# Patient Record
Sex: Female | Born: 1990 | Race: White | Hispanic: No | Marital: Married | Smoking: Current every day smoker
Health system: Southern US, Community
[De-identification: ages and names within clinical notes are randomized; demographics above are authoritative.]

## PROBLEM LIST (undated history)

## (undated) ENCOUNTER — Inpatient Hospital Stay (HOSPITAL_COMMUNITY): Admission: RE | Payer: Medicaid Other | Source: Ambulatory Visit

## (undated) ENCOUNTER — Inpatient Hospital Stay (HOSPITAL_COMMUNITY): Payer: Self-pay

## (undated) DIAGNOSIS — R519 Headache, unspecified: Secondary | ICD-10-CM

## (undated) DIAGNOSIS — M797 Fibromyalgia: Secondary | ICD-10-CM

## (undated) DIAGNOSIS — M549 Dorsalgia, unspecified: Secondary | ICD-10-CM

## (undated) DIAGNOSIS — Z789 Other specified health status: Secondary | ICD-10-CM

## (undated) DIAGNOSIS — Z8619 Personal history of other infectious and parasitic diseases: Secondary | ICD-10-CM

## (undated) DIAGNOSIS — F32A Depression, unspecified: Secondary | ICD-10-CM

## (undated) DIAGNOSIS — N920 Excessive and frequent menstruation with regular cycle: Secondary | ICD-10-CM

## (undated) DIAGNOSIS — G8929 Other chronic pain: Secondary | ICD-10-CM

## (undated) DIAGNOSIS — F419 Anxiety disorder, unspecified: Secondary | ICD-10-CM

## (undated) DIAGNOSIS — J45909 Unspecified asthma, uncomplicated: Secondary | ICD-10-CM

## (undated) DIAGNOSIS — R51 Headache: Secondary | ICD-10-CM

## (undated) DIAGNOSIS — N390 Urinary tract infection, site not specified: Secondary | ICD-10-CM

## (undated) DIAGNOSIS — F329 Major depressive disorder, single episode, unspecified: Secondary | ICD-10-CM

## (undated) HISTORY — PX: TONSILLECTOMY: SUR1361

## (undated) HISTORY — PX: CHOLECYSTECTOMY: SHX55

## (undated) HISTORY — DX: Urinary tract infection, site not specified: N39.0

## (undated) HISTORY — DX: Unspecified asthma, uncomplicated: J45.909

## (undated) HISTORY — DX: Excessive and frequent menstruation with regular cycle: N92.0

## (undated) HISTORY — PX: DILATION AND CURETTAGE OF UTERUS: SHX78

## (undated) HISTORY — DX: Personal history of other infectious and parasitic diseases: Z86.19

---
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
## 2012-01-20 ENCOUNTER — Telehealth: Payer: Self-pay | Admitting: Obstetrics and Gynecology

## 2012-01-21 ENCOUNTER — Encounter: Payer: Self-pay | Admitting: Obstetrics and Gynecology

## 2012-01-21 ENCOUNTER — Ambulatory Visit (INDEPENDENT_AMBULATORY_CARE_PROVIDER_SITE_OTHER): Payer: Medicaid Other | Admitting: Obstetrics and Gynecology

## 2012-01-21 VITALS — BP 118/70 | Resp 14 | Ht 66.0 in | Wt 152.0 lb

## 2012-01-21 DIAGNOSIS — N949 Unspecified condition associated with female genital organs and menstrual cycle: Secondary | ICD-10-CM

## 2012-01-21 DIAGNOSIS — N915 Oligomenorrhea, unspecified: Secondary | ICD-10-CM

## 2012-01-21 DIAGNOSIS — R102 Pelvic and perineal pain: Secondary | ICD-10-CM

## 2012-01-21 DIAGNOSIS — R300 Dysuria: Secondary | ICD-10-CM

## 2012-01-21 LAB — POCT URINALYSIS DIPSTICK
Glucose, UA: NEGATIVE
Spec Grav, UA: <=1.005
Urobilinogen, UA: NEGATIVE

## 2012-01-21 LAB — POCT WET PREP (WET MOUNT): pH: 4.5

## 2012-01-21 MED ORDER — DROSPIRENONE-ETHINYL ESTRADIOL 3-0.02 MG PO TABS: 1.0000 | ORAL_TABLET | Freq: Every day | ORAL | Status: DC

## 2012-01-21 NOTE — Progress Notes (Signed)
C/o swelling and discomfort at vagina.  Denies itching or irritation.  Also c/o burning with urination.  Filed Vitals:   01/21/12 1036  BP: 118/70  Resp: 14   ROS: noncontributory  Pelvic exam:  VULVA: normal appearing vulva with no masses, tenderness or lesions, VAGINA: normal appearing vagina with normal color and discharge, no lesions, CERVIX: normal appearing cervix without discharge or lesions,  UTERUS: uterus is normal size, shape, consistency and nontender,  ADNEXA: normal adnexa in size, nontender and no masses. No abnormal d/c  Results for orders placed in visit on 01/21/12  POCT URINALYSIS DIPSTICK      Component Value Range   Color, UA yellow     Clarity, UA clear     Glucose, UA neg     Bilirubin, UA neg     Ketones, UA neg     Spec Grav, UA <=1.005     Blood, UA trace     pH, UA 5.0     Protein, UA neg     Urobilinogen, UA negative     Nitrite, UA neg     Leukocytes, UA Negative    POCT WET PREP (WET MOUNT)      Component Value Range   Source Wet Prep POC vaginal     WBC, Wet Prep HPF POC       Bacteria Wet Prep HPF POC none     BACTERIA WET PREP MORPHOLOGY POC       Clue Cells Wet Prep HPF POC None     CLUE CELLS WET PREP WHIFF POC Negative Whiff     Yeast Wet Prep HPF POC None     KOH Wet Prep POC       Trichomonas Wet Prep HPF POC none     pH 4.5    POCT URINE PREGNANCY      Component Value Range   Preg Test, Ur Negative     A/P Amenorrhea on Junel (pt would like cycle) Change to Yaz (pt request) - she liked it in the past RTO for f/u OCPs RTO sooner if sxs persists

## 2012-01-23 ENCOUNTER — Telehealth: Payer: Self-pay

## 2012-01-23 ENCOUNTER — Telehealth: Payer: Self-pay | Admitting: Obstetrics and Gynecology

## 2012-01-23 LAB — URINE CULTURE

## 2012-01-23 NOTE — Telephone Encounter (Signed)
Routed to jackie  

## 2012-01-23 NOTE — Telephone Encounter (Signed)
Pt mother called requesting UPT results.Pt mother Marissa Contreras is listed on hippa form and was given results--NEG.PG [name]

## 2012-01-23 NOTE — Telephone Encounter (Signed)
Returned call to pt mother re: upt results,lmovm to return call. PG [name]

## 2012-02-03 ENCOUNTER — Telehealth: Payer: Self-pay | Admitting: Obstetrics and Gynecology

## 2012-02-03 NOTE — Telephone Encounter (Signed)
Spoke with pt's mother Cam Hai, she called earlier regarding a referral for a breast reduction to be sent over to [facility] ATTN: Dr. Jonna Munro and to be faxed to # [phone]. She was advised that the message would be given to Dr. Su Hilt and we will contact her with further instructions. Mathis Bud

## 2012-02-03 NOTE — Telephone Encounter (Signed)
AR PT 

## 2012-02-03 NOTE — Telephone Encounter (Signed)
Dr. Su Hilt this pt wants a referral for breast reduction. Last ov was 01/21/2012. Mathis Bud

## 2012-02-04 ENCOUNTER — Telehealth: Payer: Self-pay

## 2012-02-05 ENCOUNTER — Emergency Department (HOSPITAL_COMMUNITY)
Admission: EM | Admit: 2012-02-05 | Discharge: 2012-02-06 | Disposition: A | Discharge status: Home / Self Care | Payer: Medicaid Other | Attending: Emergency Medicine | Admitting: Emergency Medicine

## 2012-02-05 ENCOUNTER — Encounter (HOSPITAL_COMMUNITY): Payer: Self-pay | Admitting: Emergency Medicine

## 2012-02-05 DIAGNOSIS — R300 Dysuria: Secondary | ICD-10-CM | POA: Insufficient documentation

## 2012-02-05 DIAGNOSIS — M5432 Sciatica, left side: Secondary | ICD-10-CM

## 2012-02-05 DIAGNOSIS — M5430 Sciatica, unspecified side: Secondary | ICD-10-CM | POA: Insufficient documentation

## 2012-02-05 DIAGNOSIS — M5380 Other specified dorsopathies, site unspecified: Secondary | ICD-10-CM | POA: Insufficient documentation

## 2012-02-05 LAB — URINALYSIS, ROUTINE W REFLEX MICROSCOPIC
Glucose, UA: NEGATIVE mg/dL
Hgb urine dipstick: NEGATIVE
Ketones, ur: NEGATIVE mg/dL
Leukocytes, UA: NEGATIVE
Nitrite: NEGATIVE

## 2012-02-05 NOTE — ED Notes (Signed)
PT. REPORTS DYSURIA WITH LOW BACK PAIN RADIATING TO LEFT LEG ONSET THIS EVENING , DENIES INJURY , NO HEMATURIA .

## 2012-02-06 ENCOUNTER — Emergency Department (HOSPITAL_COMMUNITY): Payer: Medicaid Other

## 2012-02-06 MED ORDER — DIAZEPAM 5 MG PO TABS: 5.0000 mg | ORAL_TABLET | Freq: Two times a day (BID) | ORAL | Status: DC

## 2012-02-06 MED ORDER — OXYCODONE-ACETAMINOPHEN 5-325 MG PO TABS: 2.0000 | ORAL_TABLET | ORAL | Status: DC | PRN

## 2012-02-06 MED ORDER — DEXAMETHASONE SODIUM PHOSPHATE 10 MG/ML IJ SOLN
10.0000 mg | Freq: Once | INTRAMUSCULAR | Status: AC
  Administered 2012-02-06: 10 mg via INTRAMUSCULAR
  Filled 2012-02-06: qty 1

## 2012-02-06 MED ORDER — DIAZEPAM 5 MG/ML IJ SOLN
5.0000 mg | Freq: Once | INTRAMUSCULAR | Status: AC
  Administered 2012-02-06: 5 mg via INTRAMUSCULAR
  Filled 2012-02-06: qty 2

## 2012-02-06 MED ORDER — HYDROMORPHONE HCL PF 1 MG/ML IJ SOLN
1.0000 mg | Freq: Once | INTRAMUSCULAR | Status: AC
  Administered 2012-02-06: 1 mg via INTRAMUSCULAR
  Filled 2012-02-06: qty 1

## 2012-02-06 MED ORDER — PREDNISONE 10 MG PO TABS: 20.0000 mg | ORAL_TABLET | Freq: Every day | ORAL | Status: DC

## 2012-02-06 NOTE — ED Provider Notes (Signed)
Medical screening examination/treatment/procedure(s) were performed by non-physician practitioner and as supervising physician I was immediately available for consultation/collaboration.  Gem Conkle M Chavie Kolinski, MD 02/06/12 0649 

## 2012-02-06 NOTE — ED Notes (Signed)
PA at bedside.

## 2012-02-06 NOTE — ED Notes (Signed)
Pt alert and oriented, with steady gait at time of discharge. Pt given discharge papers and papers explained. All questions answered and pt walked to discharge.  

## 2012-02-06 NOTE — ED Provider Notes (Signed)
History     CSN: [id number]  Arrival date & time 02/05/12  2329   First MD Initiated Contact with Patient 02/06/12 0004      Chief Complaint  Patient presents with  . Dysuria    (Consider location/radiation/quality/duration/timing/severity/associated sxs/prior treatment) HPI Comments: Patient here with left lower back pain with radiation into her left leg since this morning - she reports mild pain with urination - denies fever, chills, or hematuria - states no known injury to lower back - states tingling and numbness to left foot - pain worse with movement.  Patient is a 21 y.o. female presenting with dysuria. The history is provided by the patient. No language interpreter was used.  Dysuria  This is a new problem. The current episode started more than 1 week ago. The problem occurs every urination. The problem has not changed since onset.The quality of the pain is described as burning. The pain is at a severity of 5/10. The pain is moderate. There has been no fever. Pertinent negatives include no chills, no sweats, no nausea, no vomiting, no discharge, no frequency, no hematuria, no hesitancy, no possible pregnancy, no urgency and no flank pain. She has tried nothing for the symptoms.    History reviewed. No pertinent past medical history.  History reviewed. No pertinent past surgical history.  No family history on file.  History  Substance Use Topics  . Smoking status: Never Smoker   . Smokeless tobacco: Not on file  . Alcohol Use: No    OB History    Grav Para Term Preterm Abortions TAB SAB Ect Mult Living                  Review of Systems  Constitutional: Negative for chills.  Gastrointestinal: Negative for nausea and vomiting.  Genitourinary: Positive for dysuria. Negative for hesitancy, urgency, frequency, hematuria and flank pain.  Musculoskeletal: Positive for back pain and gait problem.  All other systems reviewed and are negative.    Allergies  Review of  patient's allergies indicates no known allergies.  Home Medications   Current Outpatient Rx  Name Route Sig Dispense Refill  . CHROMIUM PO Oral Take 1 tablet by mouth daily.    . DROSPIRENONE-ETHINYL ESTRADIOL 3-0.02 MG PO TABS Oral Take 1 tablet by mouth daily. 1 Package 3  . IRON PO Oral Take 1 tablet by mouth daily.    Marland Kitchen MAGNESIUM PO Oral Take 1 tablet by mouth daily.      BP 129/91  Pulse 124  Temp 98.1 F (36.7 C)  Resp 18  SpO2 100%  Physical Exam  Nursing note and vitals reviewed. Constitutional: She is oriented to person, place, and time. She appears well-developed and well-nourished. She appears distressed.       Uncomfortable appearing.  HENT:  Head: Normocephalic and atraumatic.  Right Ear: External ear normal.  Left Ear: External ear normal.  Nose: Nose normal.  Mouth/Throat: Oropharynx is clear and moist. No oropharyngeal exudate.  Eyes: Conjunctivae are normal. Pupils are equal, round, and reactive to light. No scleral icterus.  Neck: Normal range of motion. Neck supple.  Cardiovascular: Normal rate, regular rhythm and normal heart sounds.  Exam reveals no gallop and no friction rub.   No murmur heard. Pulmonary/Chest: Effort normal and breath sounds normal. No respiratory distress. She has no wheezes. She has no rales. She exhibits no tenderness.  Abdominal: Soft. Bowel sounds are normal. She exhibits no distension and no mass. There is no tenderness. There  is no rebound, no guarding and no CVA tenderness.  Musculoskeletal:       Lumbar back: She exhibits decreased range of motion, tenderness and spasm. She exhibits no edema and normal pulse.       Back:       Pain on straight leg raise.  Lymphadenopathy:    She has no cervical adenopathy.  Neurological: She is alert and oriented to person, place, and time. No cranial nerve deficit.  Skin: Skin is warm and dry. No rash noted. No erythema. No pallor.  Psychiatric: She has a normal mood and affect. Her  behavior is normal. Judgment and thought content normal.    ED Course  Procedures (including critical care time)   Labs Reviewed  URINALYSIS, ROUTINE W REFLEX MICROSCOPIC  POCT PREGNANCY, URINE   Dg Lumbar Spine Complete  02/06/2012  *RADIOLOGY REPORT*  Clinical Data: Low back pain radiating to the left leg for 2 weeks. No known injury.  LUMBAR SPINE - COMPLETE 4+ VIEW  Comparison: None.  Findings: Five lumbar type vertebra.  Normal alignment of the lumbar vertebrae and facet joints.  No vertebral compression deformities.  Intervertebral disc space heights are preserved.  No focal bone lesion or bone destruction.  Bone cortex and trabecular architecture appear intact.  IMPRESSION: No displaced fractures identified.  Original Report Authenticated By: Marlon Pel, M.D.   Results for orders placed during the hospital encounter of 02/05/12  URINALYSIS, ROUTINE W REFLEX MICROSCOPIC      Component Value Range   Color, Urine YELLOW  YELLOW    APPearance CLEAR  CLEAR    Specific Gravity, Urine 1.005  1.005 - 1.030    pH 6.5  5.0 - 8.0    Glucose, UA NEGATIVE  NEGATIVE (mg/dL)   Hgb urine dipstick NEGATIVE  NEGATIVE    Bilirubin Urine NEGATIVE  NEGATIVE    Ketones, ur NEGATIVE  NEGATIVE (mg/dL)   Protein, ur NEGATIVE  NEGATIVE (mg/dL)   Urobilinogen, UA 0.2  0.0 - 1.0 (mg/dL)   Nitrite NEGATIVE  NEGATIVE    Leukocytes, UA NEGATIVE  NEGATIVE   POCT PREGNANCY, URINE      Component Value Range   Preg Test, Ur NEGATIVE  NEGATIVE    Dg Lumbar Spine Complete  02/06/2012  *RADIOLOGY REPORT*  Clinical Data: Low back pain radiating to the left leg for 2 weeks. No known injury.  LUMBAR SPINE - COMPLETE 4+ VIEW  Comparison: None.  Findings: Five lumbar type vertebra.  Normal alignment of the lumbar vertebrae and facet joints.  No vertebral compression deformities.  Intervertebral disc space heights are preserved.  No focal bone lesion or bone destruction.  Bone cortex and trabecular architecture  appear intact.  IMPRESSION: No displaced fractures identified.  Original Report Authenticated By: Marlon Pel, M.D.      Sciatica Left    MDM  Patinet here with negative urine with lower back pain with radiation down the left leg c/w sciatica - no evidence of disc disease on x-ray - pateint reports improvement in symptoms after medication - will follow up with PCP this coming week - tingling on left foot transient - no alarming signs for cauda equina.        Izola Price [place], [place] 02/06/12 [phone]

## 2012-02-08 NOTE — Telephone Encounter (Signed)
Please make referral.  Thank you.  FYI - don't close telephone encounter until all aspects have been completed.  Thank you

## 2012-02-10 ENCOUNTER — Telehealth: Payer: Self-pay | Admitting: *Deleted

## 2012-02-10 NOTE — Telephone Encounter (Signed)
Received call from [facility] stating they are listed on medicaid card as PCP .They will give our office their NPI for  visit tomorrow through the month of June while patient has chance to get it changed on his medicaid card.

## 2012-02-11 ENCOUNTER — Encounter: Payer: Self-pay | Admitting: Family Medicine

## 2012-02-11 ENCOUNTER — Ambulatory Visit (INDEPENDENT_AMBULATORY_CARE_PROVIDER_SITE_OTHER): Payer: Medicaid Other | Admitting: Family Medicine

## 2012-02-11 VITALS — BP 113/77 | HR 60 | Temp 97.0°F | Wt 149.7 lb

## 2012-02-11 DIAGNOSIS — M549 Dorsalgia, unspecified: Secondary | ICD-10-CM

## 2012-02-11 MED ORDER — HYDROCODONE-ACETAMINOPHEN 5-500 MG PO TABS: 1.0000 | ORAL_TABLET | Freq: Three times a day (TID) | ORAL | Status: DC | PRN

## 2012-02-11 MED ORDER — CYCLOBENZAPRINE HCL 10 MG PO TABS: 10.0000 mg | ORAL_TABLET | Freq: Three times a day (TID) | ORAL | Status: AC | PRN

## 2012-02-11 NOTE — Assessment & Plan Note (Addendum)
Acute on chronic low back pain with reports of numbness, tingling, weakness.  No objective deficits seen on exam today.  Will order MRI low spine to evaluate for stenosis, impingement.  Discussed with mom the possibility of false positives and in the absence of significant findings would discuss other etiologies such as fibromyalgia, mood disorder.   Will follow-up in office after MRI complete.  Gave rx for vicodin and flexeril, discussed will not be chronic medications.

## 2012-02-11 NOTE — Progress Notes (Signed)
  Subjective:    Patient ID: Marissa Contreras, female    DOB: [birth year], 21 y.o.   MRN: [id number]  HPI New patient: ER follow-up of back pain  History of chronic back and shoulder pain due to large breasts.  Worsening low back pain for several months, thought was due to lo-estrin Fe- was switched by OBGYN to another OCP which did not help.  1 week ago started to have acute worsening of low back pain.  Numbness and burning pain and tingling from low back down to toes front and back of legs.  Feels she is having more trouble moving her legs.  No problems with incotninence, No trouble with weakness, incoordiantion.  I have reviewed patient's  PMH, FH, and Social history and Medications as related to this visit. Fam Hx sig for mom, dad, brother with DJD and spinal stenosis, multiple surgeries between them all.    Mom has fibromyalgia.  She is concerned about ending an MRI.  Asks if this could be fibromyalgia.   Review of Systems Patient Information Form: Screening and ROS  AUDIT-C Score: 0 Do you feel safe in relationships? yes PHQ-2:positive  Review of Symptoms  General:  Negative for nexplained weight loss, fever Skin: Negative for new or changing mole, sore that won't heal HEENT: Negative for trouble hearing, trouble seeing, ringing in ears, mouth sores, hoarseness, change in voice, dysphagia. CV:  Negative for chest pain, dyspnea, edema, palpitations Resp: Negative for cough, dyspnea, hemoptysis GI: Negative for nausea, vomiting, diarrhea, constipation, abdominal pain, melena, hematochezia. GU: Negative for dysuria, incontinence, urinary hesitance, hematuria, vaginal or penile discharge, polyuria, sexual difficulty, lumps in testicle or breasts MSK: Negative for muscle cramps or aches, joint pain or swelling Neuro: Negative for headaches, weakness, numbness, dizziness, passing out/fainting Psych: Negative for depression, anxiety, memory problems  Negative as above except for  positives noted below:  Positive for chronic pain with urination, dyspnea, fast heartbeat, nausea, constipation, abdominal pain, headaches, weakness, numbness, dizziness    Objective:   Physical Exam GEN: Alert & Oriented, Here with her mom who does a lot of the speaking for her.  Tearful at times.  CV:  Regular Rate & Rhythm, no murmur Respiratory:  Normal work of breathing, CTAB Abd:  + BS, soft, no tenderness to palpation Ext: no pre-tibial edema MSK:  ttp paraspinal muscles lumbar spine.  Straight leg raise elicits pain in knees, hips, legs, low back bilaterally.   Neuro:  Strength 5/5.  Gait normal.  CN 2-12 grossly intact.  Patellar and biceps reflexes 2+ bilaterally.  Soft tissue tenderness to light touch throughout arms, legs     Assessment & Plan:

## 2012-02-11 NOTE — Patient Instructions (Signed)
Will order MRI to check out your low back  Make follow-up appt for after MRI to discuss results and talk about other things that may be contributing to pain

## 2012-02-12 NOTE — Telephone Encounter (Signed)
Mel, please make referral to Dr. Jonna Munro, per AR. Thanks.Marland KitchenMarland KitchenAlvino Chapel

## 2012-02-13 ENCOUNTER — Telehealth: Payer: Self-pay | Admitting: Family Medicine

## 2012-02-13 ENCOUNTER — Ambulatory Visit
Admission: RE | Admit: 2012-02-13 | Discharge: 2012-02-13 | Disposition: A | Discharge status: Home / Self Care | Payer: Medicaid Other | Source: Ambulatory Visit | Attending: Family Medicine | Admitting: Family Medicine

## 2012-02-13 DIAGNOSIS — M549 Dorsalgia, unspecified: Secondary | ICD-10-CM

## 2012-02-13 NOTE — Telephone Encounter (Signed)
Is waiting for results of MRI - would like to get this before weekend.

## 2012-02-13 NOTE — Telephone Encounter (Signed)
Please let her know results are normal.

## 2012-02-13 NOTE — Telephone Encounter (Signed)
Called patient's mother to give results of MRI, she says her daughter is still having pain and wants to see Dr Earnest Bailey for follow up. I have scheduled her an appointment for 02/18/12 at 3:15.Marissa Contreras, Rodena Medin

## 2012-02-14 ENCOUNTER — Other Ambulatory Visit: Payer: Medicaid Other

## 2012-02-17 ENCOUNTER — Ambulatory Visit (INDEPENDENT_AMBULATORY_CARE_PROVIDER_SITE_OTHER): Payer: Medicaid Other | Admitting: Family Medicine

## 2012-02-17 ENCOUNTER — Encounter: Payer: Self-pay | Admitting: Family Medicine

## 2012-02-17 VITALS — BP 110/64 | HR 104 | Temp 98.8°F | Ht 66.0 in | Wt 149.0 lb

## 2012-02-17 DIAGNOSIS — G8929 Other chronic pain: Secondary | ICD-10-CM

## 2012-02-17 MED ORDER — NAPROXEN 500 MG PO TABS: 500.0000 mg | ORAL_TABLET | Freq: Two times a day (BID) | ORAL | Status: DC

## 2012-02-17 NOTE — Patient Instructions (Signed)
I would recommend a women's one a day multivitamin  Will refer you to sports medicine to evaluate pain  Will get records from your previous doctor  Naproxen for pain occassionally as needed, or before periods  Follow-up yearly or as needed.

## 2012-02-18 ENCOUNTER — Ambulatory Visit: Payer: Medicaid Other | Admitting: Family Medicine

## 2012-02-18 ENCOUNTER — Encounter: Payer: Self-pay | Admitting: Family Medicine

## 2012-02-18 DIAGNOSIS — G8929 Other chronic pain: Secondary | ICD-10-CM | POA: Insufficient documentation

## 2012-02-18 NOTE — Progress Notes (Signed)
  Subjective:    Patient ID: SURYA FOLDEN, female    DOB: 1991-08-20, 21 y.o.   MRN: [id number]  HPI  Here to follow-up MRI results and further discuss pain  MRI lumbar spine normal.    Low and mid Back pain:  Since 5th grade, states onset was at breast development.  Has an appointment with a surgeon to discuss breast reduction.  Bilateral Knee pain:  Bilateral knees pain x 5 years.  States was told in past she has osgood-schlatter,  Then more recently may be due to "abnormal movement of kneecap"  Bilateral hip pain:  6 years in duration, states was told she has abnormal laxity causing pain.  Does not currently do any exercise.  Did not respond to course of steroids from ER, somewhat helped by vicodin, taking flexeril TID.    Screening for depression, anxiety, sleep abnormalities neg. Review of Systems    numbnes sin legs and toes. Objective:   Physical Exam GEN: Alert & Oriented, No acute distress MSK:  Pain out of proportion to exam, ttp over soft tissues all over body.        Assessment & Plan:

## 2012-02-18 NOTE — Assessment & Plan Note (Addendum)
Chronic pain of back, hips, knees- most concerning for etiology such as fibromyalgia.  No co-morbid mood disorder identified today.  For back pain, patient is seeking breast reduction.  Discussed with patient that key to controlling pain will be based on increased physical activity and possible physical therapy and will defer starting medication if are able to control symptoms with lifestyle modification.  Rxed naproxen for prn use.  Will refer to sports medicine to evaluate hip and knee pain to rule out MSk disorder and recommend specific therapies that may be helpful in increasing her function

## 2012-02-27 ENCOUNTER — Encounter: Payer: Self-pay | Admitting: Family Medicine

## 2012-02-27 ENCOUNTER — Ambulatory Visit (INDEPENDENT_AMBULATORY_CARE_PROVIDER_SITE_OTHER): Payer: Medicaid Other | Admitting: Family Medicine

## 2012-02-27 VITALS — BP 123/80 | HR 99 | Ht 66.0 in | Wt 149.0 lb

## 2012-02-27 DIAGNOSIS — M549 Dorsalgia, unspecified: Secondary | ICD-10-CM

## 2012-02-27 DIAGNOSIS — IMO0002 Reserved for concepts with insufficient information to code with codable children: Secondary | ICD-10-CM

## 2012-02-27 DIAGNOSIS — M419 Scoliosis, unspecified: Secondary | ICD-10-CM

## 2012-02-27 DIAGNOSIS — M5416 Radiculopathy, lumbar region: Secondary | ICD-10-CM

## 2012-02-27 DIAGNOSIS — M4120 Other idiopathic scoliosis, site unspecified: Secondary | ICD-10-CM

## 2012-02-27 MED ORDER — METHOCARBAMOL 500 MG PO TABS: 500.0000 mg | ORAL_TABLET | Freq: Three times a day (TID) | ORAL | Status: AC

## 2012-02-27 NOTE — Progress Notes (Signed)
  Subjective:    Patient ID: Marissa Contreras, female    DOB: 21-Jul-1991, 21 y.o.   MRN: [id number]  HPI  Marissa Contreras is a pleasant 21 years old and the patient come in today complaining of low back pain for about a year. She denies any injury to her back. She states that the pain started gradually, and he has worsening in the last 2 months. She localized the pain on the center of her back around T12 all the way down to L5-S1, she states that she has radiation of the pain on her left lower extremity all the way down to her left foot. No numbness no tingling. The pain is sharp, 4/10 in intensity, on and off, worse with certain positions, improve with medications as narcotics and muscle relaxant. She was seen on the ED recently for the same reason. She had x-rays of her lumbar spine which show mild scoliosis, she also had an MRI of her lumbar spine recently on May which was normal. She never has done any kind of treatment for the low back Patient Active Problem List  Diagnoses  . Back pain  . Chronic pain   Current Outpatient Prescriptions on File Prior to Visit  Medication Sig Dispense Refill  . drospirenone-ethinyl estradiol (YAZ) 3-0.02 MG tablet Take 1 tablet by mouth daily.  1 Package  3  . naproxen (NAPROSYN) 500 MG tablet Take 1 tablet (500 mg total) by mouth 2 (two) times daily with a meal.  60 tablet  0   No Known Allergies   Review of Systems  Constitutional: Negative for fever, chills, diaphoresis and fatigue.  Musculoskeletal: Positive for back pain. Negative for joint swelling, arthralgias and gait problem.  Neurological: Negative for weakness and numbness.       Objective:   Physical Exam  Constitutional: She is oriented to person, place, and time. She appears well-developed and well-nourished.       BP 123/80  Pulse 99  Ht 5\' 6"  (1.676 m)  Wt 149 lb (67.586 kg)  BMI 24.05 kg/m2   Pulmonary/Chest: Effort normal.  Musculoskeletal:       Low back with intact skin. No  swelling, no hematomas. FROM for flexion, extension, rotation and lateralization. Pain at the extreme of flexion and extension  No Tenderness to palpation on SI joint area B/L. Faber test negative for SI joint pain. Straight leg raise negative B/L. Strength 5/5 for hip flexion and extension, 5/5 for knee flexion and extension, 5/5 for ankle plantar and dorsal flexion B/L. DTR patellar and achilles II/IV B/L Sensation intact distally B/L. No leg discrepancy.   Neurological: She is alert and oriented to person, place, and time.  Skin: Skin is warm. No rash noted. No erythema.  Psychiatric: She has a normal mood and affect. Her behavior is normal. Thought content normal.          Assessment & Plan:   1. Back pain  methocarbamol (ROBAXIN) 500 MG tablet, Ambulatory referral to Physical Therapy  2. Scoliosis  methocarbamol (ROBAXIN) 500 MG tablet, Ambulatory referral to Physical Therapy  3. Left lumbar radiculopathy      Recommended to do physical therapy for her low back pain Discuss with her the benefits of weight loss Discussed with her the side effects of long use of narcotics Follow up after completing physical therapy

## 2012-03-03 ENCOUNTER — Telehealth: Payer: Self-pay | Admitting: Obstetrics and Gynecology

## 2012-03-03 NOTE — Telephone Encounter (Signed)
TRIAGE/EPIC °

## 2012-03-03 NOTE — Telephone Encounter (Signed)
JACKIE/PAM/PT CALL BCK

## 2012-03-04 ENCOUNTER — Telehealth: Payer: Self-pay

## 2012-03-11 ENCOUNTER — Ambulatory Visit (INDEPENDENT_AMBULATORY_CARE_PROVIDER_SITE_OTHER): Payer: Medicaid Other | Admitting: Obstetrics and Gynecology

## 2012-03-11 ENCOUNTER — Encounter: Payer: Self-pay | Admitting: Obstetrics and Gynecology

## 2012-03-11 VITALS — BP 114/80 | HR 72 | Ht 66.0 in | Wt 149.0 lb

## 2012-03-11 DIAGNOSIS — Z309 Encounter for contraceptive management, unspecified: Secondary | ICD-10-CM

## 2012-03-11 DIAGNOSIS — Z8619 Personal history of other infectious and parasitic diseases: Secondary | ICD-10-CM

## 2012-03-11 DIAGNOSIS — IMO0001 Reserved for inherently not codable concepts without codable children: Secondary | ICD-10-CM

## 2012-03-11 LAB — POCT URINE PREGNANCY: Preg Test, Ur: NEGATIVE

## 2012-03-11 NOTE — Progress Notes (Signed)
20 YO S/P SAB x 2 previously on Lo Loestrin that caused breast pain and vaginal swelling/dryness and pain.  Now on generic Yaz (second pack) complaining of daily nausea and period is only spotting for a period.  In the past "another pill" caused severe headaches and Implanon causes cramps.   Abdomen: soft, non-tender  Pelvic: EGBUS-wnl, vagina-normal rugae, uterus normal size, adnexae-no masses  UPT-negative   A: Intolerance of combination pills and Progestin Implant     S/P SAB x 2      No desire to conceive due to previious SABs      P: Information given on Heartstrings            Reviewed Paragard and Skylar and brochure given on each      for consideration        RTO-once a decision has been made about alternative        contraception

## 2012-03-11 NOTE — Progress Notes (Signed)
PT HAD SAB IN DEC,WAS PUT ON BIRTH IN MARCH;SPOTTING WITH LOLOESTRIN AND WAS PUT ON YAZ AND NOW STILL SPOTTING WITH YAZ AND HAVING NAUSEA.HEADACHE AND BREAST TENDERNESS

## 2012-03-11 NOTE — Patient Instructions (Signed)
Give informaition on Skylar and Paragard IUD

## 2012-03-12 ENCOUNTER — Encounter: Payer: Self-pay | Admitting: *Deleted

## 2012-03-12 ENCOUNTER — Telehealth: Payer: Self-pay | Admitting: *Deleted

## 2012-03-12 DIAGNOSIS — M549 Dorsalgia, unspecified: Secondary | ICD-10-CM

## 2012-03-12 NOTE — Telephone Encounter (Signed)
Spoke with pt's mom- she states they have not heard from PT to schedule Stephaine's appt.  Resent referral and gave pt's mom the number to PT to call early next week to set up appt.  Also, pt's mom states that pt has neck pain and hand numbness, she is requesting a nerve conduction study.  Advised will ask Dr. Eppie Gibson.

## 2012-03-15 ENCOUNTER — Ambulatory Visit: Payer: Medicaid Other | Admitting: Family Medicine

## 2012-03-15 NOTE — Telephone Encounter (Signed)
Spoke with pt's mom- gave her message from Dr. Jennette Kettle about nerve conduction study.  Pt's mom agreeable.

## 2012-03-15 NOTE — Telephone Encounter (Signed)
Amy Let's take care of the back first--then we can see her for any hand numbness. I would likely NOT jump immediately to Trego studies for many reasone,,,including they are painful and you have to know WHICH nerve eactly you are looking at AND they are not always definitive. We would definitely need to SEE her specifically for that issue before we went down that path. THANKS! Denny Levy

## 2012-03-30 ENCOUNTER — Ambulatory Visit: Payer: Medicaid Other | Attending: Family Medicine | Admitting: Physical Therapy

## 2012-03-30 DIAGNOSIS — M545 Low back pain, unspecified: Secondary | ICD-10-CM | POA: Insufficient documentation

## 2012-03-30 DIAGNOSIS — M25559 Pain in unspecified hip: Secondary | ICD-10-CM | POA: Insufficient documentation

## 2012-03-30 DIAGNOSIS — IMO0001 Reserved for inherently not codable concepts without codable children: Secondary | ICD-10-CM | POA: Insufficient documentation

## 2012-03-30 DIAGNOSIS — M546 Pain in thoracic spine: Secondary | ICD-10-CM | POA: Insufficient documentation

## 2012-03-30 DIAGNOSIS — R293 Abnormal posture: Secondary | ICD-10-CM | POA: Insufficient documentation

## 2012-04-06 ENCOUNTER — Encounter: Payer: Self-pay | Admitting: Obstetrics and Gynecology

## 2012-04-06 ENCOUNTER — Ambulatory Visit: Payer: Medicaid Other | Admitting: Physical Therapy

## 2012-04-06 ENCOUNTER — Ambulatory Visit (INDEPENDENT_AMBULATORY_CARE_PROVIDER_SITE_OTHER): Payer: Medicaid Other | Admitting: Obstetrics and Gynecology

## 2012-04-06 VITALS — BP 100/70 | Temp 99.1°F | Wt 147.0 lb

## 2012-04-06 DIAGNOSIS — B373 Candidiasis of vulva and vagina: Secondary | ICD-10-CM

## 2012-04-06 DIAGNOSIS — R5383 Other fatigue: Secondary | ICD-10-CM

## 2012-04-06 DIAGNOSIS — N943 Premenstrual tension syndrome: Secondary | ICD-10-CM

## 2012-04-06 DIAGNOSIS — N898 Other specified noninflammatory disorders of vagina: Secondary | ICD-10-CM

## 2012-04-06 DIAGNOSIS — R5381 Other malaise: Secondary | ICD-10-CM

## 2012-04-06 DIAGNOSIS — R102 Pelvic and perineal pain: Secondary | ICD-10-CM

## 2012-04-06 DIAGNOSIS — N949 Unspecified condition associated with female genital organs and menstrual cycle: Secondary | ICD-10-CM

## 2012-04-06 DIAGNOSIS — N644 Mastodynia: Secondary | ICD-10-CM

## 2012-04-06 LAB — CBC
HCT: 39.3 % (ref 36.0–46.0)
Hemoglobin: 13.2 g/dL (ref 12.0–15.0)
MCHC: 33.6 g/dL (ref 30.0–36.0)
RDW: 12.9 % (ref 11.5–15.5)
WBC: 6.4 10*3/uL (ref 4.0–10.5)

## 2012-04-06 MED ORDER — FLUOXETINE HCL 20 MG PO CAPS: ORAL_CAPSULE | ORAL | Status: DC

## 2012-04-06 MED ORDER — FLUCONAZOLE 150 MG PO TABS: 150.0000 mg | ORAL_TABLET | Freq: Once | ORAL | Status: AC

## 2012-04-06 NOTE — Progress Notes (Signed)
20 YO complains of  vaginitis symptoms with irritation and swelling following intercourse last week.  Patient complains of breast pain but admits to consuming lots of caffiene.  Stopped BCPs in June 2013 and does not want to choose an alternative.  States she's not trying to conceive but it's okay if it happens.  Patient also complains of fatigue. Periods lasts 9 days with pad change twice a day and minimal cramps.  Lastly patient has PMS and would like something other than birth control pills for this. Reviewed Fem Premenstrual and Sarafem as alternative treatments along with daily calcium 500 mg/Vitamin D 400 IU bid.  O:  UPT-negative       U/A-negative             Pelvic: EGBUS-mild erythema with slight edema of labia minora bilaterally, vagina-hyperremic, scant white discharge, uterus-normal size, adnexae-no       masses or tenderness    A: Breast Pain due to caffeine excess      PMS      Yeast Vaginitis      Fatigue   P:  Patient wants to try Sarafem (fluoxetine) for PMS:  Fluoxetine 20 mg # 28 1 po day 14-28 of cycle 6 refills       Perineal hygiene; declines contraception, advised MVI with > 400 mcg of folic acid       CBC-pending       Diflucan 150 mg #1 1 po stat 1 refill       RTO-AEx or prn  Seema Blum, PA-C

## 2012-04-06 NOTE — Patient Instructions (Addendum)
Take a Multivitamin with at least 400 mcg of folic acid   Avoid: - excess soap on genital area (consider using plain oatmeal soap) - use of powder or sprays in genital area - douching - wearing underwear to bed (except with menses) - using more than is directed detergent when washing clothes - tight fitting garments around genital area - excess sugar intake

## 2012-04-06 NOTE — Progress Notes (Signed)
Color: WHITE Odor: no Itching:no Thin:yes Thick:yes Fever:no Dyspareunia:no Hx PID:no HX STD:no Pelvic Pain:yes X 2 DAYS AGO Desires Gc/CT:no Desires HIV,RPR,HbsAG:no

## 2012-04-08 ENCOUNTER — Ambulatory Visit: Payer: Medicaid Other | Admitting: Physical Therapy

## 2012-04-08 ENCOUNTER — Telehealth: Payer: Self-pay | Admitting: Obstetrics and Gynecology

## 2012-04-09 NOTE — Telephone Encounter (Signed)
Tc to pt's mother regarding msg, informed lab results were normal, pt's mother voices understanding.

## 2012-04-12 ENCOUNTER — Ambulatory Visit: Payer: Medicaid Other | Admitting: Physical Therapy

## 2012-04-14 ENCOUNTER — Encounter: Payer: Medicaid Other | Admitting: Rehabilitation

## 2012-04-15 ENCOUNTER — Encounter: Payer: Medicaid Other | Admitting: Rehabilitative and Restorative Service Providers"

## 2012-04-20 ENCOUNTER — Ambulatory Visit: Payer: Medicaid Other | Admitting: Physical Therapy

## 2012-04-22 ENCOUNTER — Encounter: Payer: Medicaid Other | Admitting: Physical Therapy

## 2012-04-26 ENCOUNTER — Ambulatory Visit: Payer: Medicaid Other | Attending: Family Medicine | Admitting: Rehabilitative and Restorative Service Providers"

## 2012-04-26 DIAGNOSIS — IMO0001 Reserved for inherently not codable concepts without codable children: Secondary | ICD-10-CM | POA: Insufficient documentation

## 2012-04-26 DIAGNOSIS — M546 Pain in thoracic spine: Secondary | ICD-10-CM | POA: Insufficient documentation

## 2012-04-26 DIAGNOSIS — R293 Abnormal posture: Secondary | ICD-10-CM | POA: Insufficient documentation

## 2012-04-26 DIAGNOSIS — M25559 Pain in unspecified hip: Secondary | ICD-10-CM | POA: Insufficient documentation

## 2012-04-26 DIAGNOSIS — M545 Low back pain, unspecified: Secondary | ICD-10-CM | POA: Insufficient documentation

## 2012-04-29 ENCOUNTER — Ambulatory Visit: Payer: Medicaid Other | Admitting: Physical Therapy

## 2012-05-04 ENCOUNTER — Encounter: Payer: Medicaid Other | Admitting: Physical Therapy

## 2012-05-06 ENCOUNTER — Encounter: Payer: Medicaid Other | Admitting: Physical Therapy

## 2012-05-31 ENCOUNTER — Encounter: Payer: Medicaid Other | Admitting: Obstetrics and Gynecology

## 2012-07-26 NOTE — Telephone Encounter (Signed)
Note to close enc. Marissa Contreras, [name]  

## 2012-07-26 NOTE — Telephone Encounter (Signed)
Note to close enc. Cristian Grieves, [name]  

## 2013-12-20 ENCOUNTER — Ambulatory Visit (INDEPENDENT_AMBULATORY_CARE_PROVIDER_SITE_OTHER): Payer: Medicaid Other | Admitting: Family Medicine

## 2013-12-20 ENCOUNTER — Encounter: Payer: Self-pay | Admitting: Family Medicine

## 2013-12-20 VITALS — BP 122/80 | HR 103 | Temp 98.9°F | Wt 167.0 lb

## 2013-12-20 DIAGNOSIS — M549 Dorsalgia, unspecified: Secondary | ICD-10-CM

## 2013-12-20 DIAGNOSIS — F329 Major depressive disorder, single episode, unspecified: Secondary | ICD-10-CM

## 2013-12-20 DIAGNOSIS — F32A Depression, unspecified: Secondary | ICD-10-CM

## 2013-12-20 DIAGNOSIS — F3289 Other specified depressive episodes: Secondary | ICD-10-CM

## 2013-12-20 MED ORDER — FLUOXETINE HCL 20 MG PO CAPS: 20.0000 mg | ORAL_CAPSULE | Freq: Every day | ORAL | Status: DC

## 2013-12-20 MED ORDER — CYCLOBENZAPRINE HCL 5 MG PO TABS: 5.0000 mg | ORAL_TABLET | Freq: Three times a day (TID) | ORAL | Status: DC | PRN

## 2013-12-20 NOTE — Assessment & Plan Note (Addendum)
Chronic low back pain for >5 years, previously followed by sports med, no red flags - has tried pt x2 and it caused worsening of pain, will not try again - rx flexeril tid prn - refer to sports med, may benefit from cs injection

## 2013-12-20 NOTE — Patient Instructions (Signed)
Back Exercises Back exercises help treat and prevent back injuries. The goal of back exercises is to increase the strength of your abdominal and back muscles and the flexibility of your back. These exercises should be started when you no longer have back pain. Back exercises include:  Pelvic Tilt. Lie on your back with your knees bent. Tilt your pelvis until the lower part of your back is against the floor. Hold this position 5 to 10 sec and repeat 5 to 10 times.  Knee to Chest. Pull first 1 knee up against your chest and hold for 20 to 30 seconds, repeat this with the other knee, and then both knees. This may be done with the other leg straight or bent, whichever feels better.  Sit-Ups or Curl-Ups. Bend your knees 90 degrees. Start with tilting your pelvis, and do a partial, slow sit-up, lifting your trunk only 30 to 45 degrees off the floor. Take at least 2 to 3 seconds for each sit-up. Do not do sit-ups with your knees out straight. If partial sit-ups are difficult, simply do the above but with only tightening your abdominal muscles and holding it as directed.  Hip-Lift. Lie on your back with your knees flexed 90 degrees. Push down with your feet and shoulders as you raise your hips a couple inches off the floor; hold for 10 seconds, repeat 5 to 10 times.  Back arches. Lie on your stomach, propping yourself up on bent elbows. Slowly press on your hands, causing an arch in your low back. Repeat 3 to 5 times. Any initial stiffness and discomfort should lessen with repetition over time.  Shoulder-Lifts. Lie face down with arms beside your body. Keep hips and torso pressed to floor as you slowly lift your head and shoulders off the floor. Do not overdo your exercises, especially in the beginning. Exercises may cause you some mild back discomfort which lasts for a few minutes; however, if the pain is more severe, or lasts for more than 15 minutes, do not continue exercises until you see your caregiver.  Improvement with exercise therapy for back problems is slow.  See your caregivers for assistance with developing a proper back exercise program. Document Released: 10/16/2004 Document Revised: 12/01/2011 Document Reviewed: 07/10/2011 [employer] Patient Information 2014 [employer]  

## 2013-12-20 NOTE — Progress Notes (Signed)
   Subjective:    Patient ID: Marissa Contreras, female    DOB: [birth year], 23 y.o.   MRN: [id number]  Back Pain   BACK PAIN  Location: low back (L4-L5) Quality: dull ache Onset: middle school Worse with: exertion, sitting Better with: flexeril, no change with nsaids Radiation: no Trauma: no Best sitting/standing/leaning forward: standing  Red Flags Fecal/urinary incontinence: no  Numbness/Weakness: no  Fever/chills/sweats: no  Night pain: no  Unexplained weight loss: no  No relief with bedrest: no  h/o cancer/immunosuppression: no  IV drug use: no  PMH of osteoporosis or chronic steroid use: no   Depression: Patient also complaining of depression for >1 year, previously treated with prozac which worked well. Endorses anhedonia, insomnia, low energy,poor appetite, difficulty concentrating, thoughts of suicide without plan or intention to follow through. Denies psychomotor retardation.  Review of Systems  Musculoskeletal: Positive for back pain.   See HPI    Objective:   Physical Exam  Nursing note and vitals reviewed. Constitutional: She is oriented to person, place, and time. She appears well-developed and well-nourished. No distress.  HENT:  Head: Normocephalic and atraumatic.  Eyes: Conjunctivae are normal. Right eye exhibits no discharge. Left eye exhibits no discharge. No scleral icterus.  Neck: Normal range of motion. Neck supple.  Cardiovascular: Normal rate, regular rhythm, normal heart sounds and intact distal pulses.   Pulmonary/Chest: Effort normal.  Abdominal: She exhibits no distension.  Musculoskeletal: Normal range of motion. She exhibits tenderness. She exhibits no edema.       Lumbar back: She exhibits tenderness, bony tenderness and pain. She exhibits normal range of motion, no swelling, no edema, no deformity, no laceration, no spasm and normal pulse.       Arms: Neurological: She is alert and oriented to person, place, and time. She has normal reflexes.  She exhibits normal muscle tone. Coordination normal.  Skin: Skin is warm and dry. She is not diaphoretic.  Psychiatric: Her speech is normal. Judgment normal. Her mood appears not anxious. Her affect is blunt. Her affect is not angry and not inappropriate. She is slowed and withdrawn. Thought content is not paranoid and not delusional. Cognition and memory are normal. She exhibits a depressed mood. She expresses suicidal ideation. She expresses no suicidal plans and no homicidal plans.          Assessment & Plan:

## 2013-12-21 NOTE — Assessment & Plan Note (Addendum)
PHQ9 of 21, long-term issue, previously responded well to prozac, passive thoughts of being dead only, no active SI - trial of prozac - offered counseling resources, patient not interested - f/u in 2 weeks

## 2013-12-26 ENCOUNTER — Ambulatory Visit (INDEPENDENT_AMBULATORY_CARE_PROVIDER_SITE_OTHER): Payer: Medicaid Other | Admitting: Family Medicine

## 2013-12-26 ENCOUNTER — Encounter: Payer: Self-pay | Admitting: Family Medicine

## 2013-12-26 VITALS — BP 119/83 | HR 116 | Ht 66.0 in | Wt 167.0 lb

## 2013-12-26 DIAGNOSIS — M549 Dorsalgia, unspecified: Secondary | ICD-10-CM

## 2013-12-26 NOTE — Progress Notes (Signed)
   Subjective:    Patient ID: Marissa Contreras, female    DOB: 1991-09-07, 23 y.o.   MRN: [id number]  HPI Continued problems with low back pain. She had previously done some physical therapy but did not seem to help much. She then had a pregnancy which seemed to make her low back pain worse. Now she's having pain all day with home is all her activities. No radiation into either leg. No change of bowel or bladder habits no incontinence. Back pain is aching in nature centered over L2 area. She does not exercise regularly.   Review of Systems No fever, sweats, chills, unusual weight change. See history of present illness above for additional pertinent review of systems.    Objective:   Physical Exam Vital signs are reviewed GENERAL: Well-developed female no acute distress BACK: Mild tenderness to palpation in the paravertebral muscles in the lower thoracic upper lumbar area. The vertebra are nontender to percussion. There is no defect. She's extremely tight and low back muscles flexing at the hip only about 90. She can hyperextend without pain. Lower extremity strength is 5 out of 5 hip flexors and extensors. DTRs 2+ bilaterally equal at the knee. Sensation to soft touch is intact distally.       Assessment & Plan:  #1 pre-muscle skeletal low back pain. Again talked her about the absolute necessity to do hyperextension rehabilitation program which we gave her in handout form and my nurse reviewed with her. On her to focus on the quadruped and Superman hyperextension exercise doing 2 sets of 30 each daily and see her back in 6 weeks. She has some Flexeril at home I think is fine to continue that. If she runs I would be happy to give her a short prescription with enough to get her through till see her back in 6 weeks.

## 2013-12-30 ENCOUNTER — Telehealth: Payer: Self-pay | Admitting: Family Medicine

## 2013-12-30 DIAGNOSIS — F329 Major depressive disorder, single episode, unspecified: Secondary | ICD-10-CM

## 2013-12-30 DIAGNOSIS — F32A Depression, unspecified: Secondary | ICD-10-CM

## 2013-12-30 MED ORDER — FLUOXETINE HCL 40 MG PO CAPS: 40.0000 mg | ORAL_CAPSULE | Freq: Every day | ORAL | Status: DC

## 2013-12-30 NOTE — Telephone Encounter (Signed)
Left message on voicemail, informing patient that med refill had been sent in.

## 2013-12-30 NOTE — Telephone Encounter (Signed)
Please inform patient that I have refilled her prozac. I wrote for 40mg  tablets so she should only take 1 tablet per day.  Thanks

## 2013-12-30 NOTE — Telephone Encounter (Signed)
Patient only has 3 pills of Prozac left. Her appt with Dr. Richarda BladeAdamo is not until 01/27/14. Patient needs to know if she needs to come in earlier or just get a refill.

## 2013-12-30 NOTE — Telephone Encounter (Signed)
Will fwd to PCP.   Marissa Contreras Marissa Contreras, [name]   

## 2014-01-03 ENCOUNTER — Telehealth: Payer: Self-pay | Admitting: Family Medicine

## 2014-01-03 NOTE — Telephone Encounter (Signed)
Will FWD to MD for advice.  Dorine Duffey L Gilmar Bua, [name]  

## 2014-01-03 NOTE — Telephone Encounter (Signed)
Discussed patient's recent positive home pregnancy test. She has a follow-up scheduled when we can confirm this and discuss whether to discontinue medication during pregnancy. For now she will continue prozac.

## 2014-01-03 NOTE — Telephone Encounter (Signed)
Pt called because she took a OTC pregnancy test and it was positive. She is on medications that she doesn't know if she should be taking if pregnant. Please call ASAP to let her know what to do. jw

## 2014-01-05 ENCOUNTER — Inpatient Hospital Stay (HOSPITAL_COMMUNITY)
Admission: AD | Admit: 2014-01-05 | Discharge: 2014-01-05 | Disposition: A | Discharge status: Home / Self Care | Payer: Medicaid Other | Source: Ambulatory Visit | Attending: Obstetrics & Gynecology | Admitting: Obstetrics & Gynecology

## 2014-01-05 ENCOUNTER — Encounter (HOSPITAL_COMMUNITY): Payer: Self-pay | Admitting: *Deleted

## 2014-01-05 ENCOUNTER — Inpatient Hospital Stay (HOSPITAL_COMMUNITY): Payer: Medicaid Other

## 2014-01-05 DIAGNOSIS — O26859 Spotting complicating pregnancy, unspecified trimester: Secondary | ICD-10-CM | POA: Insufficient documentation

## 2014-01-05 DIAGNOSIS — Z87891 Personal history of nicotine dependence: Secondary | ICD-10-CM | POA: Insufficient documentation

## 2014-01-05 DIAGNOSIS — O26851 Spotting complicating pregnancy, first trimester: Secondary | ICD-10-CM

## 2014-01-05 LAB — HCG, QUANTITATIVE, PREGNANCY: HCG, BETA CHAIN, QUANT, S: 29701 m[IU]/mL — AB (ref <5)

## 2014-01-05 LAB — CBC
HEMATOCRIT: 39.5 % (ref 36.0–46.0)
Hemoglobin: 13.2 g/dL (ref 12.0–15.0)
MCH: 28.6 pg (ref 26.0–34.0)
MCHC: 33.4 g/dL (ref 30.0–36.0)
MCV: 85.7 fL (ref 78.0–100.0)
Platelets: 263 10*3/uL (ref 150–400)
RBC: 4.61 MIL/uL (ref 3.87–5.11)
RDW: 12.8 % (ref 11.5–15.5)
WBC: 8.3 10*3/uL (ref 4.0–10.5)

## 2014-01-05 LAB — WET PREP, GENITAL
CLUE CELLS WET PREP: NONE SEEN
Trich, Wet Prep: NONE SEEN
Yeast Wet Prep HPF POC: NONE SEEN

## 2014-01-05 LAB — ABO/RH: ABO/RH(D): A POS

## 2014-01-05 LAB — POCT PREGNANCY, URINE: Preg Test, Ur: POSITIVE — AB

## 2014-01-05 NOTE — MAU Provider Note (Signed)
History     CSN: [id number]  Arrival date and time: 01/05/14 1758   First Provider Initiated Contact with Patient 01/05/14 1916      Chief Complaint  Patient presents with  . Vaginal Bleeding   Vaginal Bleeding    Marissa Contreras is a 23 y.o. [id number] at Unknown who presents today with bleeding. She states that she had intercourse around 1330 today, and then afterward she had some spotting. She denies any pain at this time. She states that her LMP was in February, but that she has had very irregular periods since the birth of her first child.   Past Medical History  Diagnosis Date  . H/O varicella   . Back pain     Past Surgical History  Procedure Laterality Date  . Tonsillectomy      Family History  Problem Relation Age of Onset  . Hypertension Father   . Fibromyalgia Mother   . Arthritis Mother   . Diabetes Paternal Grandmother     History  Substance Use Topics  . Smoking status: Former Smoker    Quit date: 11/21/2011  . Smokeless tobacco: Never Used     Comment: e-cig  . Alcohol Use: No    Allergies: No Known Allergies  Prescriptions prior to admission  Medication Sig Dispense Refill  . cyclobenzaprine (FLEXERIL) 5 MG tablet Take 1 tablet (5 mg total) by mouth 3 (three) times daily as needed for muscle spasms.  90 tablet  1  . FLUoxetine (PROZAC) 40 MG capsule Take 1 capsule (40 mg total) by mouth daily.  30 capsule  5  . ibuprofen (ADVIL,MOTRIN) 400 MG tablet Take 400 mg by mouth every 6 (six) hours as needed.        Review of Systems  Genitourinary: Positive for vaginal bleeding.   Physical Exam   Blood pressure 122/78, pulse 105, temperature 98.7 F (37.1 C), temperature source Oral, resp. rate 20, height 5\' 4"  (1.626 m), weight 75.297 kg (166 lb), last menstrual period 11/07/2013, unknown if currently breastfeeding.  Physical Exam  Nursing note and vitals reviewed. Constitutional: She is oriented to person, place, and time. She appears  well-developed and well-nourished. No distress.  Cardiovascular: Normal rate.   Respiratory: Effort normal.  GI: Soft. There is no tenderness.  Genitourinary:   External: no lesion Vagina: small amount of white discharge, no blood seen  Cervix: pink, smooth, no CMT Uterus: NSSC Adnexa: NT   Neurological: She is alert and oriented to person, place, and time.  Skin: Skin is warm and dry.  Psychiatric: She has a normal mood and affect.    MAU Course  Procedures  Results for orders placed during the hospital encounter of 01/05/14 (from the past 24 hour(s))  CBC     Status: None   Collection Time    01/05/14  6:27 PM      Result Value Ref Range   WBC 8.3  4.0 - 10.5 K/uL   RBC 4.61  3.87 - 5.11 MIL/uL   Hemoglobin 13.2  12.0 - 15.0 g/dL   HCT 14.739.5  82.936.0 - 56.246.0 %   MCV 85.7  78.0 - 100.0 fL   MCH 28.6  26.0 - 34.0 pg   MCHC 33.4  30.0 - 36.0 g/dL   RDW 13.012.8  86.511.5 - 78.415.5 %   Platelets 263  150 - 400 K/uL  ABO/RH     Status: None   Collection Time    01/05/14  6:27 PM  Result Value Ref Range   ABO/RH(D) A POS    HCG, QUANTITATIVE, PREGNANCY     Status: Abnormal   Collection Time    01/05/14  6:27 PM      Result Value Ref Range   hCG, Beta Chain, [place] [address] (*) <5 mIU/mL  POCT PREGNANCY, URINE     Status: Abnormal   Collection Time    01/05/14  6:50 PM      Result Value Ref Range   Preg Test, Ur POSITIVE (*) NEGATIVE  WET PREP, GENITAL     Status: Abnormal   Collection Time    01/05/14  7:20 PM      Result Value Ref Range   Yeast Wet Prep HPF POC NONE SEEN  NONE SEEN   Trich, Wet Prep NONE SEEN  NONE SEEN   Clue Cells Wet Prep HPF POC NONE SEEN  NONE SEEN   WBC, Wet Prep HPF POC FEW (*) NONE SEEN    US Ob Comp Less 14 Wks  01/05/2014   CLINICAL DATA:  Vaginal bleeding.  Pregnant.  Beta HCG 29,700.  EXAM: OBSTETRIC <14 WK [place] AND TRANSVAGINAL OB US  TECHNIQUE: Both transabdominal and transvaginal ultrasound examinations were performed for complete evaluation  of the gestation as well as the maternal uterus, adnexal regions, and pelvic cul-de-sac. Transvaginal technique was performed to assess early pregnancy.  COMPARISON:  None.  FINDINGS: Intrauterine gestational sac: Single and fundal.  Yolk sac:  Present.  Embryo:  Present.  Cardiac Activity: Present.  Heart Rate:  103 bpm  MSD:    mm    w     d  CRL:   7.0  mm   6 w 5 d                  [place] EDC: 08/26/2014  Maternal uterus/adnexae: No subchorionic hemorrhage. Small amount of free fluid. Left ovary corpus luteum cyst is 2.0 cm.  IMPRESSION: Single live intrauterine gestation with an estimated gestational age of [redacted] weeks and 5 days. Fetal heart rate is 103 beats per min.   Electronically Signed   By: Maryclare Bean M.D.   On: 01/05/2014 20:18   US Ob Transvaginal  01/05/2014   CLINICAL DATA:  Vaginal bleeding.  Pregnant.  Beta HCG 29,700.  EXAM: OBSTETRIC <14 WK [place] AND TRANSVAGINAL OB US  TECHNIQUE: Both transabdominal and transvaginal ultrasound examinations were performed for complete evaluation of the gestation as well as the maternal uterus, adnexal regions, and pelvic cul-de-sac. Transvaginal technique was performed to assess early pregnancy.  COMPARISON:  None.  FINDINGS: Intrauterine gestational sac: Single and fundal.  Yolk sac:  Present.  Embryo:  Present.  Cardiac Activity: Present.  Heart Rate:  103 bpm  MSD:    mm    w     d  CRL:   7.0  mm   6 w 5 d                  [place] EDC: 08/26/2014  Maternal uterus/adnexae: No subchorionic hemorrhage. Small amount of free fluid. Left ovary corpus luteum cyst is 2.0 cm.  IMPRESSION: Single live intrauterine gestation with an estimated gestational age of [redacted] weeks and 5 days. Fetal heart rate is 103 beats per min.   Electronically Signed   By: Maryclare Bean M.D.   On: 01/05/2014 20:18     Assessment and Plan   1. Spotting complicating pregnancy in first trimester    Bleeding precautions  [facility]  Follow-up Information    Schedule an appointment as soon as possible for a visit with [facility].   Specialty:  Obstetrics and Gynecology   Contact information:   [address] [place] [id number] [phone]       Tawnya CrookHeather Donovan Sadeel Fiddler 01/05/2014, 7:26 PM

## 2014-01-05 NOTE — Discharge Instructions (Signed)
Pregnancy - First Trimester  During sexual intercourse, millions of sperm go into the vagina. Only 1 sperm will penetrate and fertilize the female egg while it is in the Fallopian tube. One week later, the fertilized egg implants into the wall of the uterus. An embryo begins to develop into a baby. At 6 to 8 weeks, the eyes and face are formed and the heartbeat can be seen on ultrasound. At the end of 12 weeks (first trimester), all the baby's organs are formed. Now that you are pregnant, you will want to do everything you can to have a healthy baby. Two of the most important things are to get good prenatal care and follow your caregiver's instructions. Prenatal care is all the medical care you receive before the baby's birth. It is given to prevent, find, and treat problems during the pregnancy and childbirth.  PRENATAL EXAMS  · During prenatal visits, your weight, blood pressure, and urine are checked. This is done to make sure you are healthy and progressing normally during the pregnancy.  · A pregnant woman should gain 25 to 35 pounds during the pregnancy. However, if you are overweight or underweight, your caregiver will advise you regarding your weight.  · Your caregiver will ask and answer questions for you.  · Blood work, cervical cultures, other necessary tests, and a Pap test are done during your prenatal exams. These tests are done to check on your health and the probable health of your baby. Tests are strongly recommended and done for HIV with your permission. This is the virus that causes AIDS. These tests are done because medicines can be given to help prevent your baby from being born with this infection should you have been infected without knowing it. Blood work is also used to find out your blood type, previous infections, and follow your blood levels (hemoglobin).  · Low hemoglobin (anemia) is common during pregnancy. Iron and vitamins are given to help prevent this. Later in the pregnancy, blood  tests for diabetes will be done along with any other tests if any problems develop.  · You may need other tests to make sure you and the baby are doing well.  CHANGES DURING THE FIRST TRIMESTER   Your body goes through many changes during pregnancy. They vary from person to person. Talk to your caregiver about changes you notice and are concerned about. Changes can include:  · Your menstrual period stops.  · The egg and sperm carry the genes that determine what you look like. Genes from you and your partner are forming a baby. The female genes determine whether the baby is a boy or a girl.  · Your body increases in girth and you may feel bloated.  · Feeling sick to your stomach (nauseous) and throwing up (vomiting). If the vomiting is uncontrollable, call your caregiver.  · Your breasts will begin to enlarge and become tender.  · Your nipples may stick out more and become darker.  · The need to urinate more. Painful urination may mean you have a bladder infection.  · Tiring easily.  · Loss of appetite.  · Cravings for certain kinds of food.  · At first, you may gain or lose a couple of pounds.  · You may have changes in your emotions from day to day (excited to be pregnant or concerned something may go wrong with the pregnancy and baby).  · You may have more vivid and strange dreams.  HOME CARE INSTRUCTIONS   ·   It is very important to avoid all smoking, alcohol and non-prescribed drugs during your pregnancy. These affect the formation and growth of the baby. Avoid chemicals while pregnant to ensure the delivery of a healthy infant.  · Start your prenatal visits by the 12th week of pregnancy. They are usually scheduled monthly at first, then more often in the last 2 months before delivery. Keep your caregiver's appointments. Follow your caregiver's instructions regarding medicine use, blood and lab tests, exercise, and diet.  · During pregnancy, you are providing food for you and your baby. Eat regular, well-balanced  meals. Choose foods such as meat, fish, milk and other low fat dairy products, vegetables, fruits, and whole-grain breads and cereals. Your caregiver will tell you of the ideal weight gain.  · You can help morning sickness by keeping soda crackers at the bedside. Eat a couple before arising in the morning. You may want to use the crackers without salt on them.  · Eating 4 to 5 small meals rather than 3 large meals a day also may help the nausea and vomiting.  · Drinking liquids between meals instead of during meals also seems to help nausea and vomiting.  · A physical sexual relationship may be continued throughout pregnancy if there are no other problems. Problems may be early (premature) leaking of amniotic fluid from the membranes, vaginal bleeding, or belly (abdominal) pain.  · Exercise regularly if there are no restrictions. Check with your caregiver or physical therapist if you are unsure of the safety of some of your exercises. Greater weight gain will occur in the last 2 trimesters of pregnancy. Exercising will help:  · Control your weight.  · Keep you in shape.  · Prepare you for labor and delivery.  · Help you lose your pregnancy weight after you deliver your baby.  · Wear a good support or jogging bra for breast tenderness during pregnancy. This may help if worn during sleep too.  · Ask when prenatal classes are available. Begin classes when they are offered.  · Do not use hot tubs, steam rooms, or saunas.  · Wear your seat belt when driving. This protects you and your baby if you are in an accident.  · Avoid raw meat, uncooked cheese, cat litter boxes, and soil used by cats throughout the pregnancy. These carry germs that can cause birth defects in the baby.  · The first trimester is a good time to visit your dentist for your dental health. Getting your teeth cleaned is okay. Use a softer toothbrush and brush gently during pregnancy.  · Ask for help if you have financial, counseling, or nutritional needs  during pregnancy. Your caregiver will be able to offer counseling for these needs as well as refer you for other special needs.  · Do not take any medicines or herbs unless told by your caregiver.  · Inform your caregiver if there is any mental or physical domestic violence.  · Make a list of emergency phone numbers of family, friends, hospital, and police and fire departments.  · Write down your questions. Take them to your prenatal visit.  · Do not douche.  · Do not cross your legs.  · If you have to stand for long periods of time, rotate you feet or take small steps in a circle.  · You may have more vaginal secretions that may require a sanitary pad. Do not use tampons or scented sanitary pads.  MEDICINES AND DRUG USE IN PREGNANCY  ·   Take prenatal vitamins as directed. The vitamin should contain 1 milligram of folic acid. Keep all vitamins out of reach of children. Only a couple vitamins or tablets containing iron may be fatal to a baby or young child when ingested.  · Avoid use of all medicines, including herbs, over-the-counter medicines, not prescribed or suggested by your caregiver. Only take over-the-counter or prescription medicines for pain, discomfort, or fever as directed by your caregiver. Do not use aspirin, ibuprofen, or naproxen unless directed by your caregiver.  · Let your caregiver also know about herbs you may be using.  · Alcohol is related to a number of birth defects. This includes fetal alcohol syndrome. All alcohol, in any form, should be avoided completely. Smoking will cause low birth rate and premature babies.  · Street or illegal drugs are very harmful to the baby. They are absolutely forbidden. A baby born to an addicted mother will be addicted at birth. The baby will go through the same withdrawal an adult does.  · Let your caregiver know about any medicines that you have to take and for what reason you take them.  SEEK MEDICAL CARE IF:   You have any concerns or worries during your  pregnancy. It is better to call with your questions if you feel they cannot wait, rather than worry about them.  SEEK IMMEDIATE MEDICAL CARE IF:   · An unexplained oral temperature above 102° F (38.9° C) develops, or as your caregiver suggests.  · You have leaking of fluid from the vagina (birth canal). If leaking membranes are suspected, take your temperature and inform your caregiver of this when you call.  · There is vaginal spotting or bleeding. Notify your caregiver of the amount and how many pads are used.  · You develop a bad smelling vaginal discharge with a change in the color.  · You continue to feel sick to your stomach (nauseated) and have no relief from remedies suggested. You vomit blood or coffee ground-like materials.  · You lose more than 2 pounds of weight in 1 week.  · You gain more than 2 pounds of weight in 1 week and you notice swelling of your face, hands, feet, or legs.  · You gain 5 pounds or more in 1 week (even if you do not have swelling of your hands, face, legs, or feet).  · You get exposed to German measles and have never had them.  · You are exposed to fifth disease or chickenpox.  · You develop belly (abdominal) pain. Round ligament discomfort is a common non-cancerous (benign) cause of abdominal pain in pregnancy. Your caregiver still must evaluate this.  · You develop headache, fever, diarrhea, pain with urination, or shortness of breath.  · You fall or are in a car accident or have any kind of trauma.  · There is mental or physical violence in your home.  Document Released: 09/02/2001 Document Revised: 06/02/2012 Document Reviewed: 03/06/2009  ExitCare® Patient Information ©2014 [employer].

## 2014-01-05 NOTE — MAU Note (Signed)
Found out preg 4 days ago. Noted blood in under wear when peed- was brownish,  Pinkish when wiped.  Had had intercourse earlier.

## 2014-01-05 NOTE — MAU Note (Signed)
Reports fall down steps last wk

## 2014-01-06 LAB — GC/CHLAMYDIA PROBE AMP
CT PROBE, AMP APTIMA: NEGATIVE
GC PROBE AMP APTIMA: NEGATIVE

## 2014-01-09 NOTE — MAU Provider Note (Signed)
Attestation of Attending Supervision of Advanced Practitioner (PA/CNM/NP): Evaluation and management procedures were performed by the Advanced Practitioner under my supervision and collaboration.  I have reviewed the Advanced Practitioner's note and chart, and I agree with the management and plan.  Sha Burling, MD, FACOG Attending Obstetrician & Gynecologist Faculty Practice, [facility]  

## 2014-01-11 ENCOUNTER — Encounter: Payer: Self-pay | Admitting: Family Medicine

## 2014-01-11 ENCOUNTER — Ambulatory Visit (INDEPENDENT_AMBULATORY_CARE_PROVIDER_SITE_OTHER): Payer: Medicaid Other | Admitting: Family Medicine

## 2014-01-11 VITALS — BP 121/83 | HR 121 | Temp 98.2°F | Ht 66.0 in | Wt 164.0 lb

## 2014-01-11 DIAGNOSIS — Z3480 Encounter for supervision of other normal pregnancy, unspecified trimester: Secondary | ICD-10-CM | POA: Insufficient documentation

## 2014-01-11 DIAGNOSIS — N912 Amenorrhea, unspecified: Secondary | ICD-10-CM

## 2014-01-11 LAB — POCT URINE PREGNANCY: Preg Test, Ur: POSITIVE

## 2014-01-11 MED ORDER — FLUOXETINE HCL 20 MG PO CAPS: 20.0000 mg | ORAL_CAPSULE | Freq: Every day | ORAL | Status: DC

## 2014-01-11 MED ORDER — ONDANSETRON 4 MG PO TBDP: 4.0000 mg | ORAL_TABLET | Freq: Three times a day (TID) | ORAL | Status: DC | PRN

## 2014-01-11 NOTE — Progress Notes (Signed)
   Subjective:    Patient ID: Franky Machooni Malmberg, female    DOB: 04-23-1991, 23 y.o.   MRN: [id number]  Possible Pregnancy Associated symptoms include fatigue, nausea and vomiting. Pertinent negatives include no abdominal pain.   Pt presents for confirmation of pregnancy. Home UPT positive last week. LMP 11/07/13. Severe nausea since that time. She is happy about the pregnancy but worried about the impact on her 21yo son. She would like to try something for nausea so she can function better and be able to take care of him.   Review of Systems  Constitutional: Positive for fatigue.  Gastrointestinal: Positive for nausea, vomiting and constipation. Negative for abdominal pain and blood in stool.  Psychiatric/Behavioral: Negative for dysphoric mood. The patient is nervous/anxious.   All other systems reviewed and are negative.      Objective:   Physical Exam  Nursing note and vitals reviewed. Constitutional: She is oriented to person, place, and time. She appears well-developed and well-nourished. No distress.  HENT:  Head: Normocephalic and atraumatic.  Eyes: Conjunctivae are normal. Right eye exhibits no discharge. Left eye exhibits no discharge. No scleral icterus.  Cardiovascular: Normal rate.   Pulmonary/Chest: Effort normal.  Abdominal: Soft. She exhibits no distension. There is no tenderness.  Musculoskeletal: Normal range of motion. She exhibits no edema and no tenderness.  Neurological: She is alert and oriented to person, place, and time.  Skin: Skin is warm and dry. She is not diaphoretic.  Psychiatric: She has a normal mood and affect. Her behavior is normal.          Assessment & Plan:

## 2014-01-11 NOTE — Patient Instructions (Signed)
Thank you for coming in today. Congratulations. I have refilled your prozac at the lower dose and sent in a prescription for zofran for your nausea. Please try taking vitamin B6, which can be purchased over the counter and use the zofran if you need something stronger.   I would be very happy to do your prenatal care and deliver your baby. Or you are free to get your care from an ob/gyn if you wish. Either way, you will need to get approval for medicaid and then make your new ob visit so that we can get all your lab work done. I would cancel the appt for next week since you will probably not have medicaid approval by then.  Let me know if there is anything else I can do for you.  Thanks!  Dr. Richarda BladeAdamo

## 2014-01-11 NOTE — Assessment & Plan Note (Signed)
Confirm pregnancy, [id number] by LMP - pt to apply for medicaid and make appt when approved either here or [facility] - discussed use of prozac in pregnancy, informed pt of risks and benefits, pt opts to continue low dose (20mg ) prozac. - frequent nausea impacting ability to function/parent her 23yo, rec B6 and gave script for zofran if she needs something stronger

## 2014-01-20 ENCOUNTER — Other Ambulatory Visit: Payer: Self-pay | Admitting: Family Medicine

## 2014-01-20 MED ORDER — FLUOXETINE HCL 20 MG PO CAPS: 20.0000 mg | ORAL_CAPSULE | Freq: Every day | ORAL | Status: DC

## 2014-01-20 NOTE — Telephone Encounter (Signed)
Notified patient of refill sent to pharmacy. I also asked whether she felt she needed to follow up with me next week since we just met about her pregnancy and discussed her depression at that time. She said no so I will cancel her upcoming appt.

## 2014-01-20 NOTE — Telephone Encounter (Signed)
Mrs. Marissa Contreras need to have refill on her Prozac med.  Thought she was going to be given 30 day supply but only  Had 2 wks supply.  [facility] [place]

## 2014-01-27 ENCOUNTER — Ambulatory Visit: Payer: Medicaid Other | Admitting: Family Medicine

## 2014-02-06 ENCOUNTER — Telehealth: Payer: Self-pay | Admitting: Family Medicine

## 2014-02-06 DIAGNOSIS — Z3480 Encounter for supervision of other normal pregnancy, unspecified trimester: Secondary | ICD-10-CM

## 2014-02-06 NOTE — Telephone Encounter (Signed)
Needs [facility] on [facility]an [facility] [place]

## 2014-02-06 NOTE — Telephone Encounter (Signed)
Forward to PCP for refill request.Marissa Contreras L Lino Wickliff  

## 2014-02-07 MED ORDER — ONDANSETRON 4 MG PO TBDP: 4.0000 mg | ORAL_TABLET | Freq: Three times a day (TID) | ORAL | Status: DC | PRN

## 2014-02-07 NOTE — Telephone Encounter (Signed)
LVMOM

## 2014-02-07 NOTE — Telephone Encounter (Signed)
Refill sent to pharmacy, please inform patient. Please also remind her to use miralax liberally while taking the zofran.  Thanks

## 2014-02-07 NOTE — Telephone Encounter (Signed)
Left message for patient to return call. Please read message from Dr Richarda BladeAdamo.Marissa Cheadleobert [name]

## 2014-02-14 ENCOUNTER — Inpatient Hospital Stay (HOSPITAL_COMMUNITY)
Admission: AD | Admit: 2014-02-14 | Discharge: 2014-02-14 | Disposition: A | Discharge status: Home / Self Care | Payer: Medicaid Other | Source: Ambulatory Visit | Attending: Obstetrics and Gynecology | Admitting: Obstetrics and Gynecology

## 2014-02-14 ENCOUNTER — Encounter (HOSPITAL_COMMUNITY): Payer: Self-pay

## 2014-02-14 ENCOUNTER — Inpatient Hospital Stay (HOSPITAL_COMMUNITY): Payer: Medicaid Other

## 2014-02-14 DIAGNOSIS — Z87891 Personal history of nicotine dependence: Secondary | ICD-10-CM | POA: Insufficient documentation

## 2014-02-14 DIAGNOSIS — O200 Threatened abortion: Secondary | ICD-10-CM | POA: Insufficient documentation

## 2014-02-14 DIAGNOSIS — R109 Unspecified abdominal pain: Secondary | ICD-10-CM | POA: Insufficient documentation

## 2014-02-14 DIAGNOSIS — O209 Hemorrhage in early pregnancy, unspecified: Secondary | ICD-10-CM

## 2014-02-14 LAB — URINALYSIS, ROUTINE W REFLEX MICROSCOPIC
Bilirubin Urine: NEGATIVE
GLUCOSE, UA: NEGATIVE mg/dL
Ketones, ur: NEGATIVE mg/dL
Leukocytes, UA: NEGATIVE
Nitrite: NEGATIVE
PH: 6.5 (ref 5.0–8.0)
Protein, ur: NEGATIVE mg/dL
SPECIFIC GRAVITY, URINE: 1.02 (ref 1.005–1.030)
Urobilinogen, UA: 0.2 mg/dL (ref 0.0–1.0)

## 2014-02-14 LAB — URINE MICROSCOPIC-ADD ON

## 2014-02-14 NOTE — MAU Provider Note (Signed)
History  22 yo [id number] @ 12 1/7 wks presents to MAU w/ c/o spotting and cramping since 10 a.m. today. Last intercourse 02/12/14. 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  Patient Active Problem List   Diagnosis Date Noted  . Supervision of other normal pregnancy 01/11/2014  . Depression 12/20/2013  . H/O varicella   . Chronic pain 02/18/2012  . Back pain 02/11/2012    Chief Complaint  Patient presents with  . Vaginal Discharge   HPI See above OB History   Grav Para Term Preterm Abortions TAB SAB Ect Mult Living   4 1 1  2  2   1       Past Medical History  Diagnosis Date  . H/O varicella   . Back pain     Past Surgical History  Procedure Laterality Date  . Tonsillectomy      Family History  Problem Relation Age of Onset  . Hypertension Father   . Fibromyalgia Mother   . Arthritis Mother   . Diabetes Paternal Grandmother     History  Substance Use Topics  . Smoking status: Former Smoker    Quit date: 11/21/2011  . Smokeless tobacco: Never Used     Comment: e-cig  . Alcohol Use: No    Allergies: No Known Allergies  Prescriptions prior to admission  Medication Sig Dispense Refill  . acetaminophen (TYLENOL) 500 MG tablet Take 500 mg by mouth every 6 (six) hours as needed for moderate pain.      Marland Kitchen albuterol (PROVENTIL HFA;[name] 108 (90 BASE) MCG/ACT inhaler Inhale 2 puffs into the lungs every 6 (six) hours as needed for wheezing or shortness of breath.      . cyclobenzaprine (FLEXERIL) 5 MG tablet Take 5 mg by mouth at bedtime.      Marland Kitchen FLUoxetine (PROZAC) 20 MG capsule Take 1 capsule (20 mg total) by mouth daily.  30 capsule  11  . ondansetron (ZOFRAN ODT) 4 MG disintegrating tablet Take 1-2 tablets (4-8 mg total) by mouth every 8 (eight) hours as needed for nausea or vomiting.  100 tablet  1    ROS Physical Exam   Blood pressure 125/66, pulse 84, temperature 98.2 F (36.8 C), temperature source Oral,  resp. rate 16, height 5' 5.5" (1.664 m), weight 165 lb 9.6 oz (75.116 kg), last menstrual period 11/07/2013, SpO2 100.00%, unknown if currently breastfeeding.  Physical Exam GEN: NAD Abd: Soft, NT Spec exam: No blood in vault. No discharge; cervix appears closed [place] 159 ED Course  Assessment: 23 yo G1P0 @ 12 1/7 wks with threatened abortion.  Ultrasound with IUP with CM. Normal adnexa, no free fluid  Plan: Pelvic rest Routine OB f/u  Discussed w/ Dr. Casimiro Needle CNM, MSN 02/14/2014 1:23 PM

## 2014-02-14 NOTE — MAU Note (Signed)
Patient states she had a thick mucus discharge with brown spotting at about 1000. Last intercourse on 5-24. Has slight cramping that she has had for the entire pregnancy.

## 2014-02-14 NOTE — Discharge Instructions (Signed)
° °Pelvic Rest °Pelvic rest is sometimes recommended for women when:  °· The placenta is partially or completely covering the opening of the cervix (placenta previa). °· There is bleeding between the uterine wall and the amniotic sac in the first trimester (subchorionic hemorrhage). °· The cervix begins to open without labor starting (incompetent cervix, cervical insufficiency). °· The labor is too early (preterm labor). °HOME CARE INSTRUCTIONS °· Do not have sexual intercourse, stimulation, or an orgasm. °· Do not use tampons, douche, or put anything in the vagina. °· Do not lift anything over 10 pounds (4.5 kg). °· Avoid strenuous activity or straining your pelvic muscles. °SEEK MEDICAL CARE IF:  °· You have any vaginal bleeding during pregnancy. Treat this as a potential emergency. °· You have cramping pain felt low in the stomach (stronger than menstrual cramps). °· You notice vaginal discharge (watery, mucus, or bloody). °· You have a low, dull backache. °· There are regular contractions or uterine tightening. °SEEK IMMEDIATE MEDICAL CARE IF: °You have vaginal bleeding and have placenta previa.  °Document Released: 01/03/2011 Document Revised: 12/01/2011 Document Reviewed: 01/03/2011 °ExitCare® Patient Information ©2014 [employer]. ° °Vaginal Bleeding During Pregnancy, First Trimester °A small amount of bleeding (spotting) from the vagina is relatively common in early pregnancy. It usually stops on its own. Various things may cause bleeding or spotting in early pregnancy. Some bleeding may be related to the pregnancy, and some may not. In most cases, the bleeding is normal and is not a problem. However, bleeding can also be a sign of something serious. Be sure to tell your health care provider about any vaginal bleeding right away. °Some possible causes of vaginal bleeding during the first trimester include: °· Infection or inflammation of the cervix. °· Growths (polyps) on the cervix. °· Miscarriage or  threatened miscarriage. °· Pregnancy tissue has developed outside of the uterus and in a fallopian tube (tubal pregnancy). °· Tiny cysts have developed in the uterus instead of pregnancy tissue (molar pregnancy). °HOME CARE INSTRUCTIONS  °Watch your condition for any changes. The following actions may help to lessen any discomfort you are feeling: °· Follow your health care provider's instructions for limiting your activity. If your health care provider orders bed rest, you may need to stay in bed and only get up to use the bathroom. However, your health care provider may allow you to continue light activity. °· If needed, make plans for someone to help with your regular activities and responsibilities while you are on bed rest. °· Keep track of the number of pads you use each day, how often you change pads, and how soaked (saturated) they are. Write this down. °· Do not use tampons. Do not douche. °· Do not have sexual intercourse or orgasms until approved by your health care provider. °· If you pass any tissue from your vagina, save the tissue so you can show it to your health care provider. °· Only take over-the-counter or prescription medicines as directed by your health care provider. °· Do not take aspirin because it can make you bleed. °· Keep all follow-up appointments as directed by your health care provider. °SEEK MEDICAL CARE IF: °· You have any vaginal bleeding during any part of your pregnancy. °· You have cramps or labor pains. °SEEK IMMEDIATE MEDICAL CARE IF:  °· You have severe cramps in your back or belly (abdomen). °· You have a fever, not controlled by medicine. °· You pass large clots or tissue from your vagina. °· Your bleeding   increases. °· You feel lightheaded or weak, or you have fainting episodes. °· You have chills. °· You are leaking fluid or have a gush of fluid from your vagina. °· You pass out while having a bowel movement. °MAKE SURE YOU: °· Understand these instructions. °· Will watch  your condition. °· Will get help right away if you are not doing well or get worse. °Document Released: 06/18/2005 Document Revised: 06/29/2013 Document Reviewed: 05/16/2013 °ExitCare® Patient Information ©2014 [employer]. ° °

## 2014-02-14 NOTE — MAU Note (Signed)
Pt reports bleeding since 10:00 am. Pt reports blood to be "old blood". Pt reports that her level of discomfort is no different than her normal pain level.

## 2014-03-05 ENCOUNTER — Encounter (HOSPITAL_COMMUNITY): Payer: Self-pay | Admitting: *Deleted

## 2014-03-05 ENCOUNTER — Inpatient Hospital Stay (HOSPITAL_COMMUNITY)
Admission: AD | Admit: 2014-03-05 | Discharge: 2014-03-06 | Disposition: A | Discharge status: Home / Self Care | Payer: Medicaid Other | Source: Ambulatory Visit | Attending: Obstetrics and Gynecology | Admitting: Obstetrics and Gynecology

## 2014-03-05 ENCOUNTER — Inpatient Hospital Stay (HOSPITAL_COMMUNITY): Payer: Medicaid Other

## 2014-03-05 DIAGNOSIS — O208 Other hemorrhage in early pregnancy: Secondary | ICD-10-CM | POA: Insufficient documentation

## 2014-03-05 DIAGNOSIS — E860 Dehydration: Secondary | ICD-10-CM | POA: Insufficient documentation

## 2014-03-05 DIAGNOSIS — O211 Hyperemesis gravidarum with metabolic disturbance: Secondary | ICD-10-CM | POA: Insufficient documentation

## 2014-03-05 DIAGNOSIS — Z87891 Personal history of nicotine dependence: Secondary | ICD-10-CM | POA: Insufficient documentation

## 2014-03-05 LAB — URINE MICROSCOPIC-ADD ON

## 2014-03-05 LAB — URINALYSIS, ROUTINE W REFLEX MICROSCOPIC
Glucose, UA: NEGATIVE mg/dL
HGB URINE DIPSTICK: NEGATIVE
Ketones, ur: >80 mg/dL — AB
Leukocytes, UA: NEGATIVE
Nitrite: NEGATIVE
PROTEIN: 30 mg/dL — AB
UROBILINOGEN UA: 0.2 mg/dL (ref 0.0–1.0)
pH: 6 (ref 5.0–8.0)

## 2014-03-05 LAB — URINALYSIS, DIPSTICK ONLY
Bilirubin Urine: NEGATIVE
Glucose, UA: NEGATIVE mg/dL
HGB URINE DIPSTICK: NEGATIVE
Ketones, ur: >80 mg/dL — AB
Leukocytes, UA: NEGATIVE
Nitrite: NEGATIVE
PH: 6 (ref 5.0–8.0)
Protein, ur: NEGATIVE mg/dL
SPECIFIC GRAVITY, URINE: 1.015 (ref 1.005–1.030)
Urobilinogen, UA: 0.2 mg/dL (ref 0.0–1.0)

## 2014-03-05 MED ORDER — DEXTROSE IN LACTATED RINGERS 5 % IV SOLN: INTRAVENOUS | Status: DC

## 2014-03-05 MED ORDER — ONDANSETRON HCL 4 MG/2ML IJ SOLN
4.0000 mg | Freq: Once | INTRAMUSCULAR | Status: AC
  Administered 2014-03-05: 4 mg via INTRAVENOUS
  Filled 2014-03-05: qty 2

## 2014-03-05 MED ORDER — PROMETHAZINE HCL 25 MG PO TABS
25.0000 mg | ORAL_TABLET | Freq: Once | ORAL | Status: AC
  Administered 2014-03-05: 25 mg via ORAL
  Filled 2014-03-05: qty 1

## 2014-03-05 MED ORDER — DEXTROSE 5 % IN LACTATED RINGERS IV BOLUS
500.0000 mL | Freq: Once | INTRAVENOUS | Status: AC
  Administered 2014-03-05: 500 mL via INTRAVENOUS

## 2014-03-05 MED ORDER — ZOLPIDEM TARTRATE 10 MG PO TABS: 10.0000 mg | ORAL_TABLET | Freq: Every evening | ORAL | Status: DC | PRN

## 2014-03-05 NOTE — MAU Note (Signed)
Pt reports that she is unable to "keep anything down" since yesterday afternoon. Pt tried to eat one hour ago and she vomited.

## 2014-03-05 NOTE — MAU Note (Signed)
Pt given discharge instructions. RN realized that no FHT's were documented. RN attempted to obtain FHT's via doppler without success. CNM made aware and called to bedside.

## 2014-03-05 NOTE — MAU Provider Note (Signed)
History     CSN: [id number]  Arrival date and time: 03/05/14 1841   None     Chief Complaint  Patient presents with  . Morning Sickness   HPI Comments: Pt is a 23yo G4P1 at 4367w6d arrives after calling CNM w c/o persistent N/V, took phenergan today about noon, but did not take evening dose, states zofran does not work for her. Denies any VB, discharge or LOF.      Past Medical History  Diagnosis Date  . H/O varicella   . Back pain     Past Surgical History  Procedure Laterality Date  . Tonsillectomy      Family History  Problem Relation Age of Onset  . Hypertension Father   . Fibromyalgia Mother   . Arthritis Mother   . Diabetes Paternal Grandmother     History  Substance Use Topics  . Smoking status: Former Smoker    Quit date: 11/21/2011  . Smokeless tobacco: Never Used     Comment: e-cig  . Alcohol Use: No    Allergies: No Known Allergies  Prescriptions prior to admission  Medication Sig Dispense Refill  . acetaminophen (TYLENOL) 500 MG tablet Take 500 mg by mouth every 6 (six) hours as needed for moderate pain.      . cyclobenzaprine (FLEXERIL) 5 MG tablet Take 5 mg by mouth at bedtime.      Marland Kitchen. FLUoxetine (PROZAC) 20 MG capsule Take 1 capsule (20 mg total) by mouth daily.  30 capsule  11  . ondansetron (ZOFRAN ODT) 4 MG disintegrating tablet Take 1-2 tablets (4-8 mg total) by mouth every 8 (eight) hours as needed for nausea or vomiting.  100 tablet  1  . promethazine (PHENERGAN) 25 MG tablet Take 25 mg by mouth every 6 (six) hours as needed for nausea or vomiting.      Marland Kitchen. albuterol (PROVENTIL HFA;[name] 108 (90 BASE) MCG/ACT inhaler Inhale 2 puffs into the lungs every 6 (six) hours as needed for wheezing or shortness of breath.        Review of Systems  Gastrointestinal: Positive for nausea and vomiting. Negative for abdominal pain.   Physical Exam   Blood pressure 140/80, pulse 125, temperature 98.9 F (37.2 C), temperature source Oral, resp.  rate 18, last menstrual period 11/07/2013, unknown if currently breastfeeding.  Physical Exam  Nursing note and vitals reviewed. Constitutional: She is oriented to person, place, and time. She appears well-developed and well-nourished.  HENT:  Head: Normocephalic.  Eyes: Pupils are equal, round, and reactive to light.  Neck: Normal range of motion.  Cardiovascular: Normal rate.   Initially tachycardic   Respiratory: Effort normal.  GI: Soft. Bowel sounds are normal.  Musculoskeletal: Normal range of motion.  Neurological: She is alert and oriented to person, place, and time. She has normal reflexes.  Skin: Skin is warm and dry.  Psychiatric: She has a normal mood and affect. Her behavior is normal.    MAU Course  Procedures    Assessment and Plan  IUP at 6567w6d Hyperemesis  Dehydration UA +ketones  IVF's D5LR bolus w zofran IVP Phenergan PO  Pt feeling better tol PO intake Repeat UA   Leilana Mcquire M 03/05/2014, 8:37 PM   Addendum: 10pm  RN called to report unable to hear FHT's via doppler, brief BSUS performed by me concerning for absent FHR Formal US obtained confirms absent FHR Report states CRL = GA of [redacted]wks, w fetal skin thickening noted, ovaries WNL, no free fluid Subchorionic  hemorrhage noted    Pt tearful, coping appropriately, [facility] Questions addressed, emotional support given Briefly discussed having genetic studies done, pt unsure at this time  Pt instructed on going home and having something to eat/drink now, then to remain NPO until the AM.  Will call pt ASAP w time for D&E.  Briefly discussed procedure  Pt to call me tonight if any questions or having pain or bleeding rv'd w Dr Elinor ParkinsonVarnado   Stable for d/c   S.Justina Bertini, CNM

## 2014-03-05 NOTE — Discharge Instructions (Signed)
Incomplete Miscarriage °A miscarriage is the sudden loss of an unborn baby (fetus) before the 20th week of pregnancy. In an incomplete miscarriage, parts of the fetus or placenta (afterbirth) remain in the body.  °Having a miscarriage can be an emotional experience. Talk with your health care provider about any questions you may have about miscarrying, the grieving process, and your future pregnancy plans. °CAUSES  °· Problems with the fetal chromosomes that make it impossible for the baby to develop normally. Problems with the baby's genes or chromosomes are most often the result of errors that occur by chance as the embryo divides and grows. The problems are not inherited from the parents. °· Infection of the cervix or uterus. °· Hormone problems. °· Problems with the cervix, such as having an incompetent cervix. This is when the tissue in the cervix is not strong enough to hold the pregnancy. °· Problems with the uterus, such as an abnormally shaped uterus, uterine fibroids, or congenital abnormalities. °· Certain medical conditions. °· Smoking, drinking alcohol, or taking illegal drugs. °· Trauma. °SYMPTOMS  °· Vaginal bleeding or spotting, with or without cramps or pain. °· Pain or cramping in the abdomen or lower back. °· Passing fluid, tissue, or blood clots from the vagina. °DIAGNOSIS  °Your health care provider will perform a physical exam. You may also have an ultrasound to confirm the miscarriage. Blood or urine tests may also be ordered. °TREATMENT  °· Usually, a dilation and curettage (D&C) procedure is performed. During a D&C procedure, the cervix is widened (dilated) and any remaining fetal or placental tissue is gently removed from the uterus. °· Antibiotic medicines are prescribed if there is an infection. Other medicines may be given to reduce the size of the uterus (contract) if there is a lot of bleeding. °· If you have Rh negative blood and your baby was Rh positive, you will need an Rho(D)  immune globulin shot. This shot will protect any future baby from having Rh blood problems in future pregnancies. °· You may be confined to bed rest. This means you should stay in bed and only get up to use the bathroom. °HOME CARE INSTRUCTIONS  °· Rest as directed by your health care provider. °· Restrict activity as directed by your health care provider. You may be allowed to continue light activity if curettage was not done but you require further treatment. °· Keep track of the number of pads you use each day. Keep track of how soaked (saturated) they are. Record this information. °· Do not  use tampons. °· Do not douche or have sexual intercourse until approved by your health care provider. °· Keep all follow-up appointments for re-evaluation and continuing management. °· Only take over-the-counter or prescription medicines for pain, fever, or discomfort as directed by your health care provider. °· Take antibiotic medicine as directed by your health care provider. Make sure you finish it even if you start to feel better. °SEEK IMMEDIATE MEDICAL CARE IF:  °· You experience severe cramps in your stomach, back, or abdomen. °· You have an unexplained temperature (make sure to record these temperatures). °· You pass large clots or tissue (save these for your health care provider to inspect). °· Your bleeding increases. °· You become light-headed, weak, or have fainting episodes. °MAKE SURE YOU:  °· Understand these instructions. °· Will watch your condition. °· Will get help right away if you are not doing well or get worse. °Document Released: 09/08/2005 Document Revised: 06/29/2013 Document Reviewed: 04/07/2013 °  ExitCare® Patient Information ©2014 [employer]. ° °

## 2014-03-08 ENCOUNTER — Other Ambulatory Visit: Payer: Self-pay | Admitting: Obstetrics and Gynecology

## 2014-03-08 ENCOUNTER — Ambulatory Visit (HOSPITAL_COMMUNITY): Payer: Medicaid Other

## 2014-03-08 ENCOUNTER — Encounter (HOSPITAL_COMMUNITY)
Admission: RE | Disposition: A | Discharge status: Home / Self Care | Payer: Self-pay | Source: Ambulatory Visit | Attending: Obstetrics and Gynecology

## 2014-03-08 ENCOUNTER — Encounter (HOSPITAL_COMMUNITY): Payer: Medicaid Other | Admitting: Anesthesiology

## 2014-03-08 ENCOUNTER — Ambulatory Visit (HOSPITAL_COMMUNITY): Payer: Medicaid Other | Admitting: Anesthesiology

## 2014-03-08 ENCOUNTER — Encounter (HOSPITAL_COMMUNITY): Payer: Self-pay | Admitting: Pharmacy Technician

## 2014-03-08 ENCOUNTER — Ambulatory Visit (HOSPITAL_COMMUNITY)
Admission: RE | Admit: 2014-03-08 | Discharge: 2014-03-08 | Disposition: A | Discharge status: Home / Self Care | Payer: Medicaid Other | Source: Ambulatory Visit | Attending: Obstetrics and Gynecology | Admitting: Obstetrics and Gynecology

## 2014-03-08 DIAGNOSIS — O021 Missed abortion: Secondary | ICD-10-CM | POA: Insufficient documentation

## 2014-03-08 DIAGNOSIS — Z87891 Personal history of nicotine dependence: Secondary | ICD-10-CM | POA: Insufficient documentation

## 2014-03-08 HISTORY — PX: DILATION AND EVACUATION: SHX1459

## 2014-03-08 LAB — CBC
HCT: 34.5 % — ABNORMAL LOW (ref 36.0–46.0)
Hemoglobin: 11.6 g/dL — ABNORMAL LOW (ref 12.0–15.0)
MCH: 29.2 pg (ref 26.0–34.0)
MCHC: 33.6 g/dL (ref 30.0–36.0)
MCV: 86.9 fL (ref 78.0–100.0)
Platelets: 219 10*3/uL (ref 150–400)
RBC: 3.97 MIL/uL (ref 3.87–5.11)
RDW: 13 % (ref 11.5–15.5)
WBC: 7.4 10*3/uL (ref 4.0–10.5)

## 2014-03-08 SURGERY — DILATION AND EVACUATION, UTERUS
Anesthesia: Monitor Anesthesia Care | Site: Uterus

## 2014-03-08 MED ORDER — OXYCODONE-ACETAMINOPHEN 5-325 MG PO TABS
1.0000 | ORAL_TABLET | ORAL | Status: DC | PRN
  Administered 2014-03-08: 1 via ORAL

## 2014-03-08 MED ORDER — FENTANYL CITRATE 0.05 MG/ML IJ SOLN
INTRAMUSCULAR | Status: AC
  Filled 2014-03-08: qty 2

## 2014-03-08 MED ORDER — LIDOCAINE HCL 2 % IJ SOLN
INTRAMUSCULAR | Status: DC | PRN
  Administered 2014-03-08: 10 mL

## 2014-03-08 MED ORDER — PROPOFOL 10 MG/ML IV EMUL
INTRAVENOUS | Status: AC
  Filled 2014-03-08: qty 40

## 2014-03-08 MED ORDER — MIDAZOLAM HCL 2 MG/2ML IJ SOLN
INTRAMUSCULAR | Status: DC | PRN
  Administered 2014-03-08: 2 mg via INTRAVENOUS

## 2014-03-08 MED ORDER — ONDANSETRON HCL 4 MG/2ML IJ SOLN
INTRAMUSCULAR | Status: DC | PRN
  Administered 2014-03-08: 4 mg via INTRAVENOUS

## 2014-03-08 MED ORDER — DEXAMETHASONE SODIUM PHOSPHATE 10 MG/ML IJ SOLN
INTRAMUSCULAR | Status: DC | PRN
  Administered 2014-03-08: 10 mg via INTRAVENOUS

## 2014-03-08 MED ORDER — FENTANYL CITRATE 0.05 MG/ML IJ SOLN
INTRAMUSCULAR | Status: AC
  Administered 2014-03-08: 50 ug via INTRAVENOUS
  Filled 2014-03-08: qty 2

## 2014-03-08 MED ORDER — FENTANYL CITRATE 0.05 MG/ML IJ SOLN
INTRAMUSCULAR | Status: DC | PRN
  Administered 2014-03-08: 50 ug via INTRAVENOUS
  Administered 2014-03-08 (×2): 25 ug via INTRAVENOUS
  Administered 2014-03-08: 100 ug via INTRAVENOUS

## 2014-03-08 MED ORDER — ACETAMINOPHEN 160 MG/5ML PO SOLN: 325.0000 mg | ORAL | Status: DC | PRN

## 2014-03-08 MED ORDER — IBUPROFEN 600 MG PO TABS: 600.0000 mg | ORAL_TABLET | Freq: Four times a day (QID) | ORAL | Status: DC | PRN

## 2014-03-08 MED ORDER — KETOROLAC TROMETHAMINE 30 MG/ML IJ SOLN
INTRAMUSCULAR | Status: DC | PRN
  Administered 2014-03-08: 30 mg via INTRAVENOUS

## 2014-03-08 MED ORDER — ONDANSETRON HCL 4 MG/2ML IJ SOLN
4.0000 mg | Freq: Once | INTRAMUSCULAR | Status: AC | PRN
  Administered 2014-03-08: 4 mg via INTRAVENOUS

## 2014-03-08 MED ORDER — LACTATED RINGERS IV SOLN: INTRAVENOUS | Status: DC

## 2014-03-08 MED ORDER — ACETAMINOPHEN 325 MG PO TABS: 325.0000 mg | ORAL_TABLET | ORAL | Status: DC | PRN

## 2014-03-08 MED ORDER — ONDANSETRON HCL 4 MG/2ML IJ SOLN
INTRAMUSCULAR | Status: AC
  Filled 2014-03-08: qty 2

## 2014-03-08 MED ORDER — LIDOCAINE HCL (CARDIAC) 20 MG/ML IV SOLN
INTRAVENOUS | Status: AC
  Filled 2014-03-08: qty 5

## 2014-03-08 MED ORDER — PROPOFOL 10 MG/ML IV EMUL
INTRAVENOUS | Status: AC
  Filled 2014-03-08: qty 20

## 2014-03-08 MED ORDER — LIDOCAINE HCL 2 % IJ SOLN
INTRAMUSCULAR | Status: AC
  Filled 2014-03-08: qty 20

## 2014-03-08 MED ORDER — ALBUTEROL SULFATE HFA 108 (90 BASE) MCG/ACT IN AERS
INHALATION_SPRAY | RESPIRATORY_TRACT | Status: AC
  Filled 2014-03-08: qty 6.7

## 2014-03-08 MED ORDER — PROPOFOL 10 MG/ML IV BOLUS
INTRAVENOUS | Status: DC | PRN
  Administered 2014-03-08 (×12): 20 mg via INTRAVENOUS

## 2014-03-08 MED ORDER — FENTANYL CITRATE 0.05 MG/ML IJ SOLN
25.0000 ug | INTRAMUSCULAR | Status: DC | PRN
Start: 2014-03-08 — End: 2014-03-08
  Administered 2014-03-08: 50 ug via INTRAVENOUS
  Administered 2014-03-08: 25 ug via INTRAVENOUS

## 2014-03-08 MED ORDER — ALBUTEROL SULFATE HFA 108 (90 BASE) MCG/ACT IN AERS
INHALATION_SPRAY | RESPIRATORY_TRACT | Status: DC | PRN
  Administered 2014-03-08: 2 via RESPIRATORY_TRACT

## 2014-03-08 MED ORDER — OXYCODONE-ACETAMINOPHEN 5-325 MG PO TABS: 1.0000 | ORAL_TABLET | Freq: Four times a day (QID) | ORAL | Status: DC | PRN

## 2014-03-08 MED ORDER — LACTATED RINGERS IV SOLN
INTRAVENOUS | Status: DC | PRN
  Administered 2014-03-08 (×2): via INTRAVENOUS

## 2014-03-08 MED ORDER — KETOROLAC TROMETHAMINE 30 MG/ML IJ SOLN
INTRAMUSCULAR | Status: AC
  Filled 2014-03-08: qty 1

## 2014-03-08 MED ORDER — DEXAMETHASONE SODIUM PHOSPHATE 10 MG/ML IJ SOLN
INTRAMUSCULAR | Status: AC
  Filled 2014-03-08: qty 1

## 2014-03-08 MED ORDER — LIDOCAINE HCL (CARDIAC) 20 MG/ML IV SOLN
INTRAVENOUS | Status: DC | PRN
  Administered 2014-03-08: 50 mg via INTRAVENOUS

## 2014-03-08 MED ORDER — MIDAZOLAM HCL 2 MG/2ML IJ SOLN
INTRAMUSCULAR | Status: AC
  Filled 2014-03-08: qty 2

## 2014-03-08 MED ORDER — ONDANSETRON HCL 4 MG/2ML IJ SOLN
INTRAMUSCULAR | Status: AC
  Administered 2014-03-08: 4 mg via INTRAVENOUS
  Filled 2014-03-08: qty 2

## 2014-03-08 MED ORDER — OXYCODONE-ACETAMINOPHEN 5-325 MG PO TABS
ORAL_TABLET | ORAL | Status: AC
  Filled 2014-03-08: qty 1

## 2014-03-08 SURGICAL SUPPLY — 18 items
CATH ROBINSON RED A/P 16FR (CATHETERS) ×3 IMPLANT
CLOTH BEACON ORANGE TIMEOUT ST (SAFETY) ×3 IMPLANT
DECANTER SPIKE VIAL GLASS SM (MISCELLANEOUS) ×3 IMPLANT
GLOVE BIO SURGEON STRL SZ7.5 (GLOVE) ×3 IMPLANT
GLOVE BIOGEL PI IND STRL 7.5 (GLOVE) ×2 IMPLANT
GLOVE BIOGEL PI INDICATOR 7.5 (GLOVE) ×4
GOWN STRL REUS W/TWL LRG LVL3 (GOWN DISPOSABLE) ×6 IMPLANT
KIT BERKELEY 1ST TRIMESTER 3/8 (MISCELLANEOUS) ×3 IMPLANT
NS IRRIG 1000ML POUR BTL (IV SOLUTION) ×3 IMPLANT
PACK VAGINAL MINOR WOMEN LF (CUSTOM PROCEDURE TRAY) ×3 IMPLANT
PAD OB MATERNITY 4.3X12.25 (PERSONAL CARE ITEMS) ×3 IMPLANT
PAD PREP 24X48 CUFFED NSTRL (MISCELLANEOUS) ×3 IMPLANT
SET BERKELEY SUCTION TUBING (SUCTIONS) ×3 IMPLANT
TOWEL OR 17X24 6PK STRL BLUE (TOWEL DISPOSABLE) ×6 IMPLANT
VACURETTE 10 RIGID CVD (CANNULA) ×3 IMPLANT
VACURETTE 7MM CVD STRL WRAP (CANNULA) IMPLANT
VACURETTE 8 RIGID CVD (CANNULA) IMPLANT
VACURETTE 9 RIGID CVD (CANNULA) IMPLANT

## 2014-03-08 NOTE — Discharge Instructions (Signed)
DISCHARGE INSTRUCTIONS: D&C / D&E The following instructions have been prepared to help you care for yourself upon your return home.  May take Ibuprofen after 11:10 pm as needed for cramps   Personal hygiene:  Use sanitary pads for vaginal drainage, not tampons.  Shower the day after your procedure.  NO tub baths, pools or Jacuzzis for 2-3 weeks.  Wipe front to back after using the bathroom.  Activity and limitations:  Do NOT drive or operate any equipment for 24 hours. The effects of anesthesia are still present and drowsiness may result.  Do NOT rest in bed all day.  Walking is encouraged.  Walk up and down stairs slowly.  You may resume your normal activity in one to two days or as indicated by your physician.  Sexual activity: NO intercourse for at least 2 weeks after the procedure, or as indicated by your physician.  Diet: Eat a light meal as desired this evening. You may resume your usual diet tomorrow.  Return to work: You may resume your work activities in one to two days or as indicated by your doctor.  What to expect after your surgery: Expect to have vaginal bleeding/discharge for 2-3 days and spotting for up to 10 days. It is not unusual to have soreness for up to 1-2 weeks. You may have a slight burning sensation when you urinate for the first day. Mild cramps may continue for a couple of days. You may have a regular period in 2-6 weeks.  Call your doctor for any of the following:  Excessive vaginal bleeding, saturating and changing one pad every hour.  Inability to urinate 6 hours after discharge from hospital.  Pain not relieved by pain medication.  Fever of 100.4 F or greater.  Unusual vaginal discharge or odor.   Call for an appointment:    Patients signature: ______________________  Support person's signature ________________________  Nurse's signature_______________________

## 2014-03-08 NOTE — Anesthesia Preprocedure Evaluation (Signed)
Anesthesia Evaluation  Patient identified by MRN, date of birth, ID band Patient awake    Reviewed: Allergy & Precautions, H&P , Patient's Chart, lab work & pertinent test results, reviewed documented beta blocker date and time   Airway Mallampati: II TM Distance: >3 FB Neck ROM: full    Dental no notable dental hx.    Pulmonary former smoker,  breath sounds clear to auscultation  Pulmonary exam normal       Cardiovascular Rhythm:regular Rate:Normal     Neuro/Psych    GI/Hepatic   Endo/Other    Renal/GU      Musculoskeletal   Abdominal   Peds  Hematology   Anesthesia Other Findings   Reproductive/Obstetrics                           Anesthesia Physical Anesthesia Plan  ASA: II  Anesthesia Plan: MAC   Post-op Pain Management:    Induction: Intravenous  Airway Management Planned: LMA, Mask and Natural Airway  Additional Equipment:   Intra-op Plan:   Post-operative Plan:   Informed Consent: I have reviewed the patients History and Physical, chart, labs and discussed the procedure including the risks, benefits and alternatives for the proposed anesthesia with the patient or authorized representative who has indicated his/her understanding and acceptance.   Dental Advisory Given  Plan Discussed with: CRNA and Surgeon  Anesthesia Plan Comments: (Discussed sedation and potential to need to place airway or ETT if warranted by clinical changes intra-operatively. We will start procedure as MAC.)        Anesthesia Quick Evaluation  

## 2014-03-08 NOTE — Op Note (Signed)
Preop Diagnosis: missed abortion   Postop Diagnosis: Missed Abortion   Procedure: DILATATION AND EVACUATION   Anesthesia: General   Attending: Osborn CohoAngela Roberts, MD   Assistant: N/a  Findings: Large amount of POCs  Pathology: POCs and some sent for genetic testing as well  Fluids: 1700 cc  UOP: 200 cc  EBL: 500 cc  Complications: None  Procedure: The patient was taken to the operating room after the risks benefits and alternatives were discussed with the patient, the patient verbalized understanding and consent signed and witnessed.  The patient was placed under MAC anesthesia, prepped and draped in the normal sterile fashion and a time out was performed.  A bivalve speculum was placed in the patient's vagina and the anterior lip of the cervix grasped with a single-tooth tenaculum. A paracervical block was administered using a total of 10 cc of 2% lidocaine.  The uterus was sounded to 12 cm and a size 10 suction curette was used. Suction curettage was performed until minimal tissue returned. Sharp curettage was performed until a gritty texture was noted. Suction curettage was performed once again to remove any remaining debris. All instruments were removed. The count was correct. The patient was transferred to the recovery room in good condition.

## 2014-03-08 NOTE — Anesthesia Postprocedure Evaluation (Signed)
  Anesthesia Post-op Note  Patient: Marissa Contreras  Procedure(s) Performed: Procedure(s) with comments: DILATATION AND EVACUATION  (N/A) - with chromosomal studies, sent  Patient is awake and responsive. Pain and nausea are reasonably well controlled. Vital signs are stable and clinically acceptable. Oxygen saturation is clinically acceptable. There are no apparent anesthetic complications at this time. Patient is ready for discharge.

## 2014-03-08 NOTE — H&P (Signed)
Marissa Contreras is an 23 y.o. female. Pt presents today with missed ab for scheduled suction D&C.  Denies any bleeding or cramping and would like genetic testing.   Patient's last menstrual period was 11/07/2013.    Past Medical History  Diagnosis Date  . H/O varicella   . Back pain     Past Surgical History  Procedure Laterality Date  . Tonsillectomy      Family History  Problem Relation Age of Onset  . Hypertension Father   . Fibromyalgia Mother   . Arthritis Mother   . Diabetes Paternal Grandmother     Social History:  reports that she quit smoking about 2 years ago. She has never used smokeless tobacco. She reports that she does not drink alcohol or use illicit drugs.  Allergies: No Known Allergies  Prescriptions prior to admission  Medication Sig Dispense Refill  . acetaminophen (TYLENOL) 500 MG tablet Take 500 mg by mouth every 6 (six) hours as needed for moderate pain.      . cyclobenzaprine (FLEXERIL) 5 MG tablet Take 5 mg by mouth at bedtime.      Marland Kitchen. FLUoxetine (PROZAC) 20 MG capsule Take 1 capsule (20 mg total) by mouth daily.  30 capsule  11  . ondansetron (ZOFRAN-ODT) 4 MG disintegrating tablet Take 4 mg by mouth every 8 (eight) hours as needed for nausea or vomiting.      Marland Kitchen. albuterol (PROVENTIL HFA;[name] 108 (90 BASE) MCG/ACT inhaler Inhale 2 puffs into the lungs every 6 (six) hours as needed for wheezing or shortness of breath.        ROS  Non-contributory  Blood pressure 123/79, pulse 80, temperature 98.4 F (36.9 C), temperature source Oral, resp. rate 18, height 5\' 6"  (1.676 m), weight 75.297 kg (166 lb), last menstrual period 11/07/2013, SpO2 99.00%, unknown if currently breastfeeding. Physical Exam Lungs CTA CV RRR Abd soft, NT Ext no calf tenderness Pelvic exam deferred to OR  [facility] fetal [facility]/missed AB measuring 13wks  No results found.  Assessment/Plan: P1 at 15 wks by dates scheduled for suction D&C/D&E.  R/B/A  discussed with patient and consent signed and witnessed.  POCs to be sent for genetic testing and path.  ROBERTS,ANGELA Y 03/08/2014, 4:03 PM

## 2014-03-08 NOTE — Transfer of Care (Signed)
Immediate Anesthesia Transfer of Care Note  Patient: Marissa Contreras  Procedure(s) Performed: Procedure(s) with comments: DILATATION AND EVACUATION  (N/A) - with chromosomal studies, sent   Patient Location: PACU  Anesthesia Type:MAC  Level of Consciousness: sedated  Airway & Oxygen Therapy: Patient Spontanous Breathing  Post-op Assessment: Report given to PACU RN  Post vital signs: Reviewed and stable  Complications: No apparent anesthesia complications

## 2014-03-09 ENCOUNTER — Encounter (HOSPITAL_COMMUNITY): Payer: Self-pay | Admitting: Obstetrics and Gynecology

## 2014-03-13 ENCOUNTER — Encounter (HOSPITAL_COMMUNITY): Payer: Self-pay

## 2014-03-13 ENCOUNTER — Inpatient Hospital Stay (HOSPITAL_COMMUNITY)
Admission: AD | Admit: 2014-03-13 | Discharge: 2014-03-14 | Disposition: A | Discharge status: Home / Self Care | Payer: Medicaid Other | Source: Ambulatory Visit | Attending: Obstetrics and Gynecology | Admitting: Obstetrics and Gynecology

## 2014-03-13 DIAGNOSIS — R109 Unspecified abdominal pain: Secondary | ICD-10-CM | POA: Insufficient documentation

## 2014-03-13 DIAGNOSIS — O039 Complete or unspecified spontaneous abortion without complication: Secondary | ICD-10-CM | POA: Insufficient documentation

## 2014-03-13 LAB — CBC
HEMATOCRIT: 30.2 % — AB (ref 36.0–46.0)
HEMOGLOBIN: 9.9 g/dL — AB (ref 12.0–15.0)
MCH: 28.7 pg (ref 26.0–34.0)
MCHC: 32.8 g/dL (ref 30.0–36.0)
MCV: 87.5 fL (ref 78.0–100.0)
Platelets: 287 10*3/uL (ref 150–400)
RBC: 3.45 MIL/uL — ABNORMAL LOW (ref 3.87–5.11)
RDW: 13.3 % (ref 11.5–15.5)
WBC: 8.4 10*3/uL (ref 4.0–10.5)

## 2014-03-13 MED ORDER — OXYCODONE-ACETAMINOPHEN 5-325 MG PO TABS
1.0000 | ORAL_TABLET | Freq: Once | ORAL | Status: AC
  Administered 2014-03-14: 2 via ORAL
  Filled 2014-03-13: qty 2

## 2014-03-13 NOTE — MAU Note (Signed)
PT SAYS SHE SAB ON  ON 6-14  THEN  HAD D/C ON 6-17  AND   TODAY IS [PASSING CLOTS-   CALLED OFFICE  AFTER HRS TODAY.     ALSO HAVING CRAMPS-    . TOOK  OXYCODONE  AND IBUPROFEN  FOR PAIN.     ON PAD IN TRIAGE-   SMALL AMT LIGHT RED

## 2014-03-13 NOTE — MAU Provider Note (Signed)
History     CSN: [id number]  Arrival date and time: 03/13/14 2058   None     No chief complaint on file.  HPI Comments: Pt is a 22yo white female arrives to MAU unannounced w c/o increased bleeding and cramping today, s/p D&E by Dr Su Hiltoberts on 6/17 for IUFD at 13wks. Pt admits to doing more activity today and hasn't been taking any pain meds. She states her bleeding had nearly stopped until today when it got heavier and passed a few small (grape) sized clots. Denies any N/V, has not saturated a pad. Denies any dizziness. Here w sig other, states she is coping w loss.      Past Medical History  Diagnosis Date  . H/O varicella   . Back pain     Past Surgical History  Procedure Laterality Date  . Tonsillectomy    . Dilation and evacuation N/A 03/08/2014    Procedure: DILATATION AND EVACUATION ;  Surgeon: Purcell NailsAngela Y Roberts, MD;  Location: [facility];  Service: Gynecology;  Laterality: N/A;  with chromosomal studies, sent     Family History  Problem Relation Age of Onset  . Hypertension Father   . Fibromyalgia Mother   . Arthritis Mother   . Diabetes Paternal Grandmother     History  Substance Use Topics  . Smoking status: Former Smoker    Quit date: 11/21/2011  . Smokeless tobacco: Never Used     Comment: e-cig  . Alcohol Use: No    Allergies: No Known Allergies  Prescriptions prior to admission  Medication Sig Dispense Refill  . acetaminophen (TYLENOL) 500 MG tablet Take 500 mg by mouth every 6 (six) hours as needed for moderate pain.      Marland Kitchen. albuterol (PROVENTIL HFA;[name] 108 (90 BASE) MCG/ACT inhaler Inhale 2 puffs into the lungs every 6 (six) hours as needed for wheezing or shortness of breath.      . cyclobenzaprine (FLEXERIL) 5 MG tablet Take 5 mg by mouth at bedtime.      Marland Kitchen. FLUoxetine (PROZAC) 20 MG capsule Take 1 capsule (20 mg total) by mouth daily.  30 capsule  11  . ibuprofen (ADVIL,MOTRIN) 600 MG tablet Take 1 tablet (600 mg total) by mouth every 6 (six)  hours as needed.  30 tablet  1  . ondansetron (ZOFRAN-ODT) 4 MG disintegrating tablet Take 4 mg by mouth every 8 (eight) hours as needed for nausea or vomiting.      Marland Kitchen. oxyCODONE-acetaminophen (PERCOCET) 5-325 MG per tablet Take 1-2 tablets by mouth every 6 (six) hours as needed for severe pain.  30 tablet  0    Review of Systems  Gastrointestinal: Positive for abdominal pain.  Genitourinary:       Vaginal bleeding   Physical Exam   Blood pressure 122/67, pulse 114, temperature 98.1 F (36.7 C), temperature source Oral, resp. rate 20, height 5\' 5"  (1.651 m), weight 159 lb 4 oz (72.235 kg), unknown if currently breastfeeding.  Physical Exam  Nursing note and vitals reviewed. Constitutional: She is oriented to person, place, and time. She appears well-developed and well-nourished.  HENT:  Head: Normocephalic.  Eyes: Pupils are equal, round, and reactive to light.  Neck: Normal range of motion.  Cardiovascular: Normal rate, regular rhythm and normal heart sounds.   Respiratory: Effort normal and breath sounds normal.  GI: Soft. Bowel sounds are normal.  Genitourinary: Vagina normal.  sm amt dark red blood blood in vault  Bimanual WNL,   Musculoskeletal: Normal  range of motion.  Neurological: She is alert and oriented to person, place, and time. She has normal reflexes.  Skin: Skin is warm and dry.  Psychiatric: She has a normal mood and affect. Her behavior is normal.   CBC Latest Ref Rng 03/13/2014 03/08/2014 01/05/2014  WBC 4.0 - 10.5 K/uL 8.4 7.4 8.3  Hemoglobin 12.0 - 15.0 g/dL 1.9(J9.9(L) 11.6(L) 13.2  Hematocrit 36.0 - 46.0 % 30.2(L) 34.5(L) 39.5  Platelets 150 - 400 K/uL 287 219 263      MAU Course  Procedures    Assessment and Plan  S/p D&E on 6/17 for 13wk IUFD PE WNL VSS Pt given 2 tabs percocet and felt better  dc'd home in stable condition w rx percocet and instructions to take motrin 600mg  q6h as scheduled for 72 hours. F/u office routine   LILLARD,SHELLEY  M 03/13/2014, 11:18 PM

## 2014-03-14 MED ORDER — OXYCODONE-ACETAMINOPHEN 5-325 MG PO TABS: 1.0000 | ORAL_TABLET | Freq: Four times a day (QID) | ORAL | Status: DC | PRN

## 2014-03-14 NOTE — Discharge Instructions (Signed)
Dilation and Curettage or Vacuum Curettage, Care After  Refer to this sheet in the next few weeks. These instructions provide you with information on caring for yourself after your procedure. Your health care provider may also give you more specific instructions. Your treatment has been planned according to current medical practices, but problems sometimes occur. Call your health care provider if you have any problems or questions after your procedure.  WHAT TO EXPECT AFTER THE PROCEDURE  After your procedure, it is typical to have light cramping and bleeding. This may last for 2 days to 2 weeks after the procedure.  HOME CARE INSTRUCTIONS   · Do not drive for 24 hours.  · Wait 1 week before returning to strenuous activities.  · Take your temperature 2 times a day for 4 days and write it down. Provide these temperatures to your health care provider if you develop a fever.  · Avoid long periods of standing.  · Avoid heavy lifting, pushing, or pulling. Do not lift anything heavier than 10 pounds (4.5 kg).  · Limit stair climbing to once or twice a day.  · Take rest periods often.  · You may resume your usual diet.  · Drink enough fluids to keep your urine clear or pale yellow.  · Your usual bowel function should return. If you have constipation, you may:  ¨ Take a mild laxative with permission from your health care provider.  ¨ Add fruit and bran to your diet.  ¨ Drink more fluids.  · Take showers instead of baths until your health care provider gives you permission to take baths.  · Do not go swimming or use a hot tub until your health care provider approves.  · Try to have someone with you or available to you the first 24-48 hours, especially if you were given a general anesthetic.  · Do not douche, use tampons, or have sex (intercourse) for 2 weeks after the procedure.  · Only take over-the-counter or prescription medicines as directed by your health care provider. Do not take aspirin. It can cause  bleeding.  · Follow up with your health care provider as directed.  SEEK MEDICAL CARE IF:   · You have increasing cramps or pain that is not relieved with medicine.  · You have abdominal pain that does not seem to be related to the same area of earlier cramping and pain.  · You have bad smelling vaginal discharge.  · You have a rash.  · You are having problems with any medicine.  SEEK IMMEDIATE MEDICAL CARE IF:   · You have bleeding that is heavier than a normal menstrual period.  · You have a fever.  · You have chest pain.  · You have shortness of breath.  · You feel dizzy or feel like fainting.  · You pass out.  · You have pain in your shoulder strap area.  · You have heavy vaginal bleeding with or without blood clots.  MAKE SURE YOU:   · Understand these instructions.  · Will watch your condition.  · Will get help right away if you are not doing well or get worse.  Document Released: 09/05/2000 Document Revised: 09/13/2013 Document Reviewed: 04/07/2013  ExitCare® Patient Information ©2015 [employer]. This information is not intended to replace advice given to you by your health care provider. Make sure you discuss any questions you have with your health care provider.

## 2014-03-16 ENCOUNTER — Telehealth: Payer: Self-pay | Admitting: Family Medicine

## 2014-03-16 NOTE — Telephone Encounter (Signed)
Needs prozac dosage to be increased to 40 mg.  Had been decreased to 20 when she was pregant. She had a miscarriage last week so the dosage needs to be increased.

## 2014-03-17 MED ORDER — FLUOXETINE HCL 40 MG PO CAPS: 40.0000 mg | ORAL_CAPSULE | Freq: Every day | ORAL | Status: DC

## 2014-03-17 NOTE — Telephone Encounter (Signed)
Left message to return call. Please read message to patient form Dr Richarda BladeAdamo.Busick, Rodena Medinobert Lee

## 2014-03-17 NOTE — Telephone Encounter (Signed)
I was unable to reach patient by phone. Please inform her that her increased prozac dose has been sent to the pharmacy and that if she needs anything else or is having any difficulty coping to please come in to see me.  Thanks!  Michelle NasutiElena

## 2014-03-20 NOTE — Telephone Encounter (Signed)
Patient informed about Prozac. Patient is now requesting Tramadol for pain after  D&C. Patient given Percocet but unable to tolerate it. Please advise.

## 2014-03-21 ENCOUNTER — Other Ambulatory Visit: Payer: Self-pay | Admitting: Family Medicine

## 2014-03-21 MED ORDER — TRAMADOL HCL 50 MG PO TABS: 50.0000 mg | ORAL_TABLET | Freq: Three times a day (TID) | ORAL | Status: DC | PRN

## 2014-04-07 LAB — CHROMOSOME STD, POC(TISSUE)-NCBH

## 2014-04-24 ENCOUNTER — Encounter (HOSPITAL_COMMUNITY): Payer: Self-pay | Admitting: Emergency Medicine

## 2014-04-24 ENCOUNTER — Emergency Department (HOSPITAL_COMMUNITY)
Admission: EM | Admit: 2014-04-24 | Discharge: 2014-04-24 | Discharge status: Against Medical Advice | Payer: Medicaid Other | Source: Home / Self Care

## 2014-04-24 ENCOUNTER — Emergency Department (HOSPITAL_COMMUNITY): Payer: Medicaid Other

## 2014-04-24 ENCOUNTER — Emergency Department (HOSPITAL_COMMUNITY)
Admission: EM | Admit: 2014-04-24 | Discharge: 2014-04-25 | Disposition: A | Discharge status: Home / Self Care | Payer: Medicaid Other | Attending: Emergency Medicine | Admitting: Emergency Medicine

## 2014-04-24 DIAGNOSIS — Z8619 Personal history of other infectious and parasitic diseases: Secondary | ICD-10-CM | POA: Insufficient documentation

## 2014-04-24 DIAGNOSIS — O9989 Other specified diseases and conditions complicating pregnancy, childbirth and the puerperium: Secondary | ICD-10-CM | POA: Insufficient documentation

## 2014-04-24 DIAGNOSIS — M545 Low back pain, unspecified: Secondary | ICD-10-CM | POA: Insufficient documentation

## 2014-04-24 DIAGNOSIS — R5381 Other malaise: Secondary | ICD-10-CM | POA: Insufficient documentation

## 2014-04-24 DIAGNOSIS — Z3490 Encounter for supervision of normal pregnancy, unspecified, unspecified trimester: Secondary | ICD-10-CM

## 2014-04-24 DIAGNOSIS — M549 Dorsalgia, unspecified: Secondary | ICD-10-CM

## 2014-04-24 DIAGNOSIS — Z87891 Personal history of nicotine dependence: Secondary | ICD-10-CM | POA: Diagnosis not present

## 2014-04-24 DIAGNOSIS — Z79899 Other long term (current) drug therapy: Secondary | ICD-10-CM | POA: Insufficient documentation

## 2014-04-24 DIAGNOSIS — G8929 Other chronic pain: Secondary | ICD-10-CM | POA: Insufficient documentation

## 2014-04-24 DIAGNOSIS — R5383 Other fatigue: Secondary | ICD-10-CM

## 2014-04-24 HISTORY — DX: Other chronic pain: G89.29

## 2014-04-24 HISTORY — DX: Dorsalgia, unspecified: M54.9

## 2014-04-24 LAB — COMPREHENSIVE METABOLIC PANEL
ALT: 12 U/L (ref 0–35)
ANION GAP: 12 (ref 5–15)
AST: 15 U/L (ref 0–37)
Albumin: 4.1 g/dL (ref 3.5–5.2)
Alkaline Phosphatase: 80 U/L (ref 39–117)
BUN: 6 mg/dL (ref 6–23)
CALCIUM: 9.1 mg/dL (ref 8.4–10.5)
CO2: 24 meq/L (ref 19–32)
CREATININE: 0.61 mg/dL (ref 0.50–1.10)
Chloride: 109 mEq/L (ref 96–112)
GFR calc Af Amer: >90 mL/min (ref >90)
Glucose, Bld: 92 mg/dL (ref 70–99)
Potassium: 4.2 mEq/L (ref 3.7–5.3)
Sodium: 145 mEq/L (ref 137–147)
Total Bilirubin: <0.2 mg/dL — ABNORMAL LOW (ref 0.3–1.2)
Total Protein: 6.7 g/dL (ref 6.0–8.3)

## 2014-04-24 LAB — URINALYSIS, ROUTINE W REFLEX MICROSCOPIC
Bilirubin Urine: NEGATIVE
Glucose, UA: NEGATIVE mg/dL
Hgb urine dipstick: NEGATIVE
KETONES UR: NEGATIVE mg/dL
Leukocytes, UA: NEGATIVE
Nitrite: NEGATIVE
PROTEIN: NEGATIVE mg/dL
Specific Gravity, Urine: 1.008 (ref 1.005–1.030)
UROBILINOGEN UA: 0.2 mg/dL (ref 0.0–1.0)
pH: 6.5 (ref 5.0–8.0)

## 2014-04-24 LAB — CBC WITH DIFFERENTIAL/PLATELET
Basophils Absolute: 0 10*3/uL (ref 0.0–0.1)
Basophils Relative: 0 % (ref 0–1)
Eosinophils Absolute: 0.1 10*3/uL (ref 0.0–0.7)
Eosinophils Relative: 1 % (ref 0–5)
HEMATOCRIT: 38.4 % (ref 36.0–46.0)
Hemoglobin: 12 g/dL (ref 12.0–15.0)
LYMPHS ABS: 2.2 10*3/uL (ref 0.7–4.0)
Lymphocytes Relative: 43 % (ref 12–46)
MCH: 27.3 pg (ref 26.0–34.0)
MCHC: 31.3 g/dL (ref 30.0–36.0)
MCV: 87.5 fL (ref 78.0–100.0)
MONO ABS: 0.5 10*3/uL (ref 0.1–1.0)
Monocytes Relative: 9 % (ref 3–12)
Neutro Abs: 2.4 10*3/uL (ref 1.7–7.7)
Neutrophils Relative %: 47 % (ref 43–77)
Platelets: 309 10*3/uL (ref 150–400)
RBC: 4.39 MIL/uL (ref 3.87–5.11)
RDW: 13.1 % (ref 11.5–15.5)
WBC: 5.2 10*3/uL (ref 4.0–10.5)

## 2014-04-24 LAB — POC URINE PREG, ED
PREG TEST UR: POSITIVE — AB
Preg Test, Ur: POSITIVE — AB

## 2014-04-24 LAB — HCG, QUANTITATIVE, PREGNANCY: hCG, Beta Chain, Quant, S: 110 m[IU]/mL — ABNORMAL HIGH (ref <5)

## 2014-04-24 MED ORDER — PREDNISONE 50 MG PO TABS: 60.0000 mg | ORAL_TABLET | Freq: Once | ORAL | Status: DC

## 2014-04-24 MED ORDER — METHOCARBAMOL 500 MG PO TABS: 1000.0000 mg | ORAL_TABLET | Freq: Once | ORAL | Status: DC

## 2014-04-24 NOTE — ED Notes (Signed)
Patient approached desk and stated that [facility] is leaving and [facility]. Patient seen exiting department.

## 2014-04-24 NOTE — ED Notes (Addendum)
Patient complaining of back pain x 1 week. States "I have had back pain for years but no one knows what is wrong." Denies dysuria. [facility] due to long wait times.

## 2014-04-24 NOTE — ED Notes (Addendum)
Low back pain, [facility], but had to leave prior to treatment being completed.  Alert NAD  Has had back pain "since I was 16" worse in last week

## 2014-04-24 NOTE — ED Notes (Signed)
Pt c/o lower back pain x several days more severe than normal chronic pain and feeling fatigue; pt sts LMP was 04/14/14 and longer than usual

## 2014-04-25 ENCOUNTER — Telehealth: Payer: Self-pay | Admitting: Family Medicine

## 2014-04-25 MED ORDER — PRENATAL VITAMINS 0.8 MG PO TABS: 1.0000 | ORAL_TABLET | Freq: Every day | ORAL | Status: DC

## 2014-04-25 NOTE — Telephone Encounter (Signed)
Please call patient about her confirmation of pregnancy.

## 2014-04-25 NOTE — ED Provider Notes (Signed)
CSN: [id number]     Arrival date & time 04/24/14  2008 History   First MD Initiated Contact with Patient 04/24/14 2129     Chief Complaint  Patient presents with  . Back Pain     (Consider location/radiation/quality/duration/timing/severity/associated sxs/prior Treatment) HPI  Marissa Contreras is a 23 y.o. female with a history of D & E on 6/17 for fetal demise at [redacted] weeks gestation, presenting for evaluation of acute on chronic low back pain which she describes as aching, constant, worse with movement better but not resolved at rest.  She has been told she has mild scoliosis, but has not undergone active treatment for this condition.  She has no radiation of pain into legs, denies urinary or fecal incontinence or retention, no saddle anesthesia.  Additionally she denies fevers, chills, nausea, vomiting and abdominal pain.  Additionally she denies dysuria and hematuria.  Her LMP was 04/14/14 and lasted 7 days, longer than her normal period.      Past Medical History  Diagnosis Date  . H/O varicella   . Back pain   . Chronic back pain    Past Surgical History  Procedure Laterality Date  . Tonsillectomy    . Dilation and evacuation N/A 03/08/2014    Procedure: DILATATION AND EVACUATION ;  Surgeon: Purcell Nails, MD;  Location: [facility];  Service: Gynecology;  Laterality: N/A;  with chromosomal studies, sent    Family History  Problem Relation Age of Onset  . Hypertension Father   . Fibromyalgia Mother   . Arthritis Mother   . Diabetes Paternal Grandmother    History  Substance Use Topics  . Smoking status: Former Smoker    Quit date: 11/21/2011  . Smokeless tobacco: Never Used     Comment: e-cig  . Alcohol Use: No   OB History   Grav Para Term Preterm Abortions TAB SAB Ect Mult Living   4 1 1  3  3   1      Review of Systems  Constitutional: Negative for fever.  Respiratory: Negative for shortness of breath.   Cardiovascular: Negative for chest pain and leg swelling.   Gastrointestinal: Negative for abdominal pain, constipation and abdominal distention.  Genitourinary: Negative for dysuria, urgency, frequency, flank pain, vaginal discharge, difficulty urinating and pelvic pain.  Musculoskeletal: Positive for back pain. Negative for gait problem and joint swelling.  Skin: Negative for rash.  Neurological: Negative for weakness and numbness.      Allergies  Review of patient's allergies indicates no known allergies.  Home Medications   Prior to Admission medications   Medication Sig Start Date End Date Taking? Authorizing Provider  acetaminophen (TYLENOL) 500 MG tablet Take 500 mg by mouth every 6 (six) hours as needed for moderate pain.    Historical Provider, MD  albuterol (PROVENTIL HFA;[name] 108 (90 BASE) MCG/ACT inhaler Inhale 2 puffs into the lungs every 6 (six) hours as needed for wheezing or shortness of breath.    Historical Provider, MD  cyclobenzaprine (FLEXERIL) 5 MG tablet Take 5 mg by mouth at bedtime.    Historical Provider, MD  FLUoxetine (PROZAC) 40 MG capsule Take 1 capsule (40 mg total) by mouth daily. 03/17/14   [name], MD  ibuprofen (ADVIL,MOTRIN) 600 MG tablet Take 1 tablet (600 mg total) by mouth every 6 (six) hours as needed. 03/08/14   Purcell Nails, MD  ondansetron (ZOFRAN-ODT) 4 MG disintegrating tablet Take 4 mg by mouth every 8 (eight) hours as needed  for nausea or vomiting.    Historical Provider, MD  oxyCODONE-acetaminophen (PERCOCET) 5-325 MG per tablet Take 1-2 tablets by mouth every 6 (six) hours as needed for severe pain. 03/14/14   Malissa HippoShelley M Lillard, CNM  Prenatal Multivit-Min-Fe-FA (PRENATAL VITAMINS) 0.8 MG tablet Take 1 tablet by mouth daily. 04/25/14   Burgess AmorJulie Asuna Peth, PA-C  traMADol (ULTRAM) 50 MG tablet Take 1 tablet (50 mg total) by mouth every 8 (eight) hours as needed. 03/21/14   Abram SanderElena M Adamo, MD   BP 114/73  Pulse 73  Temp(Src) 98.2 F (36.8 C) (Oral)  Resp 20  Ht 5\' 6"  (1.676 m)  Wt 155 lb  (70.308 kg)  BMI 25.03 kg/m2  SpO2 100%  LMP 04/14/2014 Physical Exam  Nursing note and vitals reviewed. Constitutional: She appears well-developed and well-nourished.  HENT:  Head: Normocephalic.  Eyes: Conjunctivae are normal.  Neck: Normal range of motion. Neck supple.  Cardiovascular: Normal rate and intact distal pulses.   Pedal pulses normal.  Pulmonary/Chest: Effort normal.  Abdominal: Soft. Bowel sounds are normal. She exhibits no distension and no mass. There is no tenderness. There is no guarding.  Musculoskeletal: Normal range of motion. She exhibits no edema.       Lumbar back: She exhibits tenderness. She exhibits no swelling, no edema and no spasm.  Neurological: She is alert. She has normal strength. She displays no atrophy and no tremor. No sensory deficit. Gait normal.  Reflex Scores:      Patellar reflexes are 2+ on the right side and 2+ on the left side.      Achilles reflexes are 2+ on the right side and 2+ on the left side. No strength deficit noted in hip and knee flexor and extensor muscle groups.  Ankle flexion and extension intact.  Skin: Skin is warm and dry.  Psychiatric: She has a normal mood and affect.    ED Course  Procedures (including critical care time) Labs Review Labs Reviewed  HCG, QUANTITATIVE, PREGNANCY - Abnormal; Notable for the following:    hCG, Beta Chain, Quant, S 110 (*)    All other components within normal limits  POC URINE PREG, ED - Abnormal; Notable for the following:    Preg Test, Ur POSITIVE (*)    All other components within normal limits    Imaging Review No results found.   EKG Interpretation None      MDM   Final diagnoses:  Chronic back pain  Pregnancy    Review of chart after interview [facility].  Questioned pt about this visit.  States she got tired of waiting so came here.  Positive pregnancy test today.  Pt informed of this finding.  She states she has been having increasing  fatigue.  Denies abdominal/pelvic pain, no vaginal bleeding.  Pt was placed on prenatal vitamins,  Referral to local obgyn to establish care.  Quant c/w early pregnancy -2 weeks.  The patient appears reasonably screened and/or stabilized for discharge and I doubt any other medical condition or other [facility] requiring further screening, evaluation, or treatment in the ED at this time prior to discharge.     Burgess AmorJulie Kohle Winner, PA-C 04/26/14 1600

## 2014-04-25 NOTE — Discharge Instructions (Signed)
Back Injury Prevention Back injuries can be extremely painful and difficult to heal. After having one back injury, you are much more likely to experience another later on. It is important to learn how to avoid injuring or re-injuring your back. The following tips can help you to prevent a back injury. PHYSICAL FITNESS  Exercise regularly and try to develop good tone in your abdominal muscles. Your abdominal muscles provide a lot of the support needed by your back.  Do aerobic exercises (walking, jogging, biking, swimming) regularly.  Do exercises that increase balance and strength (tai chi, yoga) regularly. This can decrease your risk of falling and injuring your back.  Stretch before and after exercising.  Maintain a healthy weight. The more you weigh, the more stress is placed on your back. For every pound of weight, 10 times that amount of pressure is placed on the back. DIET  Talk to your caregiver about how much calcium and vitamin D you need per day. These nutrients help to prevent weakening of the bones (osteoporosis). Osteoporosis can cause broken (fractured) bones that lead to back pain.  Include good sources of calcium in your diet, such as dairy products, green, leafy vegetables, and products with calcium added (fortified).  Include good sources of vitamin D in your diet, such as milk and foods that are fortified with vitamin D.  Consider taking a nutritional supplement or a multivitamin if needed.  Stop smoking if you smoke. POSTURE  Sit and stand up straight. Avoid leaning forward when you sit or hunching over when you stand.  Choose chairs with good low back (lumbar) support.  If you work at a desk, sit close to your work so you do not need to lean over. Keep your chin tucked in. Keep your neck drawn back and elbows bent at a right angle. Your arms should look like the letter "L."  Sit high and close to the steering wheel when you drive. Add a lumbar support to your car  seat if needed.  Avoid sitting or standing in one position for too long. Take breaks to get up, stretch, and walk around at least once every hour. Take breaks if you are driving for long periods of time.  Sleep on your side with your knees slightly bent, or sleep on your back with a pillow under your knees. Do not sleep on your stomach. LIFTING, TWISTING, AND REACHING  Avoid heavy lifting, especially repetitive lifting. If you must do heavy lifting:  Stretch before lifting.  Work slowly.  Rest between lifts.  Use carts and dollies to move objects when possible.  Make several small trips instead of carrying 1 heavy load.  Ask for help when you need it.  Ask for help when moving big, awkward objects.  Follow these steps when lifting:  Stand with your feet shoulder-width apart.  Get as close to the object as you can. Do not try to pick up heavy objects that are far from your body.  Use handles or lifting straps if they are available.  Bend at your knees. Squat down, but keep your heels off the floor.  Keep your shoulders pulled back, your chin tucked in, and your back straight.  Lift the object slowly, tightening the muscles in your legs, abdomen, and buttocks. Keep the object as close to the center of your body as possible.  When you put a load down, use these same guidelines in reverse.  Do not:  Lift the object above your waist.  Twist at the waist while lifting or carrying a load. Move your feet if you need to turn, not your waist.  Bend over without bending at your knees.  Avoid reaching over your head, across a table, or for an object on a high surface. OTHER TIPS  Avoid wet floors and keep sidewalks clear of ice to prevent falls.  Do not sleep on a mattress that is too soft or too hard.  Keep items that are used frequently within easy reach.  Put heavier objects on shelves at waist level and lighter objects on lower or higher shelves.  Find ways to  decrease your stress, such as exercise, massage, or relaxation techniques. Stress can build up in your muscles. Tense muscles are more vulnerable to injury.  Seek treatment for depression or anxiety if needed. These conditions can increase your risk of developing back pain. SEEK MEDICAL CARE IF:  You injure your back.  You have questions about diet, exercise, or other ways to prevent back injuries. MAKE SURE YOU:  Understand these instructions.  Will watch your condition.  Will get help right away if you are not doing well or get worse. Document Released: 10/16/2004 Document Revised: 12/01/2011 Document Reviewed: 10/20/2011 ExitCare Patient Information 2015 [employer]. This information is not intended to replace advice given to you by your health care provider. Make sure you discuss any questions you have with your health care provider.  

## 2014-04-27 ENCOUNTER — Encounter: Payer: Self-pay | Admitting: Family Medicine

## 2014-04-27 NOTE — ED Provider Notes (Signed)
Medical screening examination/treatment/procedure(s) were performed by non-physician practitioner and as supervising physician I was immediately available for consultation/collaboration.   EKG Interpretation None        Rolland PorterMark Taelar Gronewold, MD 04/27/14 [phone]

## 2014-04-27 NOTE — Telephone Encounter (Signed)
Called pt and she states that her LMP was 04/14/14. [place], [place] [name]

## 2014-04-27 NOTE — Telephone Encounter (Signed)
I have been unable to get in touch with patient. If you are able, please ask her for her LMP if any so that I can estimate her gestational age (with a recent miscarriage she may not have had a period since). Once I have that information I would be happy to write her a pregnancy confirmation letter that she can pick up, or we can fax it for her. Thanks.

## 2014-04-28 ENCOUNTER — Emergency Department (HOSPITAL_COMMUNITY): Payer: Medicaid Other

## 2014-04-28 ENCOUNTER — Encounter (HOSPITAL_COMMUNITY): Payer: Self-pay | Admitting: Emergency Medicine

## 2014-04-28 ENCOUNTER — Emergency Department (HOSPITAL_COMMUNITY)
Admission: EM | Admit: 2014-04-28 | Discharge: 2014-04-28 | Disposition: A | Discharge status: Home / Self Care | Payer: Medicaid Other | Attending: Emergency Medicine | Admitting: Emergency Medicine

## 2014-04-28 DIAGNOSIS — G8929 Other chronic pain: Secondary | ICD-10-CM | POA: Diagnosis not present

## 2014-04-28 DIAGNOSIS — R799 Abnormal finding of blood chemistry, unspecified: Secondary | ICD-10-CM

## 2014-04-28 DIAGNOSIS — Z87891 Personal history of nicotine dependence: Secondary | ICD-10-CM | POA: Insufficient documentation

## 2014-04-28 DIAGNOSIS — Z79899 Other long term (current) drug therapy: Secondary | ICD-10-CM | POA: Diagnosis not present

## 2014-04-28 DIAGNOSIS — Z791 Long term (current) use of non-steroidal anti-inflammatories (NSAID): Secondary | ICD-10-CM | POA: Diagnosis not present

## 2014-04-28 DIAGNOSIS — Z8619 Personal history of other infectious and parasitic diseases: Secondary | ICD-10-CM | POA: Insufficient documentation

## 2014-04-28 DIAGNOSIS — O9989 Other specified diseases and conditions complicating pregnancy, childbirth and the puerperium: Secondary | ICD-10-CM | POA: Insufficient documentation

## 2014-04-28 DIAGNOSIS — R109 Unspecified abdominal pain: Secondary | ICD-10-CM

## 2014-04-28 DIAGNOSIS — R1031 Right lower quadrant pain: Secondary | ICD-10-CM | POA: Diagnosis not present

## 2014-04-28 DIAGNOSIS — F3289 Other specified depressive episodes: Secondary | ICD-10-CM | POA: Insufficient documentation

## 2014-04-28 DIAGNOSIS — F329 Major depressive disorder, single episode, unspecified: Secondary | ICD-10-CM | POA: Insufficient documentation

## 2014-04-28 DIAGNOSIS — O0281 Inappropriate change in quantitative human chorionic gonadotropin (hCG) in early pregnancy: Secondary | ICD-10-CM | POA: Diagnosis not present

## 2014-04-28 DIAGNOSIS — M259 Joint disorder, unspecified: Secondary | ICD-10-CM | POA: Diagnosis not present

## 2014-04-28 DIAGNOSIS — M549 Dorsalgia, unspecified: Secondary | ICD-10-CM | POA: Insufficient documentation

## 2014-04-28 DIAGNOSIS — O99340 Other mental disorders complicating pregnancy, unspecified trimester: Secondary | ICD-10-CM | POA: Insufficient documentation

## 2014-04-28 DIAGNOSIS — M899 Disorder of bone, unspecified: Secondary | ICD-10-CM | POA: Insufficient documentation

## 2014-04-28 DIAGNOSIS — O26899 Other specified pregnancy related conditions, unspecified trimester: Secondary | ICD-10-CM

## 2014-04-28 DIAGNOSIS — IMO0002 Reserved for concepts with insufficient information to code with codable children: Secondary | ICD-10-CM

## 2014-04-28 LAB — CBC WITH DIFFERENTIAL/PLATELET
BASOS ABS: 0 10*3/uL (ref 0.0–0.1)
BASOS PCT: 0 % (ref 0–1)
Eosinophils Absolute: 0 10*3/uL (ref 0.0–0.7)
Eosinophils Relative: 1 % (ref 0–5)
HEMATOCRIT: 38.1 % (ref 36.0–46.0)
Hemoglobin: 12 g/dL (ref 12.0–15.0)
LYMPHS PCT: 34 % (ref 12–46)
Lymphs Abs: 1.4 10*3/uL (ref 0.7–4.0)
MCH: 27.5 pg (ref 26.0–34.0)
MCHC: 31.5 g/dL (ref 30.0–36.0)
MCV: 87.4 fL (ref 78.0–100.0)
Monocytes Absolute: 0.5 10*3/uL (ref 0.1–1.0)
Monocytes Relative: 11 % (ref 3–12)
NEUTROS PCT: 54 % (ref 43–77)
Neutro Abs: 2.3 10*3/uL (ref 1.7–7.7)
PLATELETS: 267 10*3/uL (ref 150–400)
RBC: 4.36 MIL/uL (ref 3.87–5.11)
RDW: 12.8 % (ref 11.5–15.5)
WBC: 4.2 10*3/uL (ref 4.0–10.5)

## 2014-04-28 LAB — URINALYSIS, ROUTINE W REFLEX MICROSCOPIC
Bilirubin Urine: NEGATIVE
GLUCOSE, UA: NEGATIVE mg/dL
HGB URINE DIPSTICK: NEGATIVE
KETONES UR: NEGATIVE mg/dL
Leukocytes, UA: NEGATIVE
Nitrite: NEGATIVE
PROTEIN: NEGATIVE mg/dL
Specific Gravity, Urine: 1.015 (ref 1.005–1.030)
Urobilinogen, UA: 0.2 mg/dL (ref 0.0–1.0)
pH: 7.5 (ref 5.0–8.0)

## 2014-04-28 LAB — HCG, QUANTITATIVE, PREGNANCY: HCG, BETA CHAIN, QUANT, S: 278 m[IU]/mL — AB (ref <5)

## 2014-04-28 NOTE — ED Notes (Signed)
C/o pain to left side.  Pt is currently [redacted] weeks pregnant.  Has history of 3 miscarriages.

## 2014-04-28 NOTE — ED Provider Notes (Signed)
CSN: [id number]     Arrival date & time 04/28/14  1114 History   First MD Initiated Contact with Patient 04/28/14 1137     Chief Complaint  Patient presents with  . Abdominal Pain     (Consider location/radiation/quality/duration/timing/severity/associated sxs/prior Treatment) Patient is a 23 y.o. female presenting with abdominal pain. The history is provided by the patient.  Abdominal Pain Associated symptoms: no dysuria, no fever, no nausea, no vaginal discharge and no vomiting    Marissa Contreras is a 23 y.o. female who presents to the ED with lower abdominal pain that started yesterday. She was evaluated here 4 days ago with low back pain and had a positive pregnancy test and a Bhcg that was 110. She returns today with increased back pain and now left lower abdominal pain. [id number], she has had SAB x3. LMP 04/14/14   Past Medical History  Diagnosis Date  . H/O varicella   . Back pain   . Chronic back pain    Past Surgical History  Procedure Laterality Date  . Tonsillectomy    . Dilation and evacuation N/A 03/08/2014    Procedure: DILATATION AND EVACUATION ;  Surgeon: Purcell NailsAngela Y Roberts, MD;  Location: [facility];  Service: Gynecology;  Laterality: N/A;  with chromosomal studies, sent    Family History  Problem Relation Age of Onset  . Hypertension Father   . Fibromyalgia Mother   . Arthritis Mother   . Diabetes Paternal Grandmother    History  Substance Use Topics  . Smoking status: Former Smoker    Quit date: 11/21/2011  . Smokeless tobacco: Never Used     Comment: e-cig  . Alcohol Use: No   OB History   Grav Para Term Preterm Abortions TAB SAB Ect Mult Living   5 1 1  3  3   1      Review of Systems  Constitutional: Negative for fever.  HENT: Negative.   Eyes: Negative for visual disturbance.  Gastrointestinal: Positive for abdominal pain. Negative for nausea and vomiting.  Genitourinary: Positive for frequency. Negative for dysuria, urgency, vaginal discharge and vaginal  pain.  Musculoskeletal: Positive for back pain.  Skin: Negative for rash.  Neurological: Negative for light-headedness and headaches.  Psychiatric/Behavioral: Nervous/anxious: hx of depression and is on medication.       Allergies  Review of patient's allergies indicates no known allergies.  Home Medications   Prior to Admission medications   Medication Sig Start Date End Date Taking? Authorizing Provider  acetaminophen (TYLENOL) 500 MG tablet Take 500 mg by mouth every 6 (six) hours as needed for moderate pain.    Historical Provider, MD  albuterol (PROVENTIL HFA;[name] 108 (90 BASE) MCG/ACT inhaler Inhale 2 puffs into the lungs every 6 (six) hours as needed for wheezing or shortness of breath.    Historical Provider, MD  cyclobenzaprine (FLEXERIL) 5 MG tablet Take 5 mg by mouth at bedtime.    Historical Provider, MD  FLUoxetine (PROZAC) 40 MG capsule Take 1 capsule (40 mg total) by mouth daily. 03/17/14   Abram SanderElena M Adamo, MD  ibuprofen (ADVIL,MOTRIN) 600 MG tablet Take 1 tablet (600 mg total) by mouth every 6 (six) hours as needed. 03/08/14   Purcell NailsAngela Y Roberts, MD  ondansetron (ZOFRAN-ODT) 4 MG disintegrating tablet Take 4 mg by mouth every 8 (eight) hours as needed for nausea or vomiting.    Historical Provider, MD  oxyCODONE-acetaminophen (PERCOCET) 5-325 MG per tablet Take 1-2 tablets by mouth every 6 (six) hours  as needed for severe pain. 03/14/14   Malissa Hippo, CNM  Prenatal Multivit-Min-Fe-FA (PRENATAL VITAMINS) 0.8 MG tablet Take 1 tablet by mouth daily. 04/25/14   Burgess Amor, PA-C  traMADol (ULTRAM) 50 MG tablet Take 1 tablet (50 mg total) by mouth every 8 (eight) hours as needed. 03/21/14   [name], MD   BP 129/75  Pulse 68  Temp(Src) 98 F (36.7 C) (Oral)  Resp 18  Ht 5\' 6"  (1.676 m)  Wt 156 lb (70.761 kg)  BMI 25.19 kg/m2  SpO2 100%  LMP 04/14/2014 Physical Exam  Nursing note and vitals reviewed. Constitutional: She is oriented to person, place, and time.  She appears well-developed and well-nourished.  HENT:  Head: Normocephalic.  Eyes: EOM are normal.  Neck: Neck supple.  Cardiovascular: Normal rate.   Pulmonary/Chest: Effort normal.  Abdominal: Soft. Bowel sounds are normal. There is tenderness in the right lower quadrant. There is no rebound, no guarding and no CVA tenderness.  Tenderness is mild  Genitourinary:  Pelvic exam not done  Musculoskeletal: Normal range of motion.  Neurological: She is alert and oriented to person, place, and time. No cranial nerve deficit.  Skin: Skin is warm and dry.  Psychiatric: She has a normal mood and affect. Her behavior is normal.    ED Course  Procedures Results for orders placed during the hospital encounter of 04/28/14 (from the past 24 hour(s))  URINALYSIS, ROUTINE W REFLEX MICROSCOPIC     Status: None   Collection Time    04/28/14 11:25 AM      Result Value Ref Range   Color, Urine YELLOW  YELLOW   APPearance CLEAR  CLEAR   Specific Gravity, Urine 1.015  1.005 - 1.030   pH 7.5  5.0 - 8.0   Glucose, UA NEGATIVE  NEGATIVE mg/dL   Hgb urine dipstick NEGATIVE  NEGATIVE   Bilirubin Urine NEGATIVE  NEGATIVE   Ketones, ur NEGATIVE  NEGATIVE mg/dL   Protein, ur NEGATIVE  NEGATIVE mg/dL   Urobilinogen, UA 0.2  0.0 - 1.0 mg/dL   Nitrite NEGATIVE  NEGATIVE   Leukocytes, UA NEGATIVE  NEGATIVE  HCG, QUANTITATIVE, PREGNANCY     Status: Abnormal   Collection Time    04/28/14 11:48 AM      Result Value Ref Range   hCG, Beta Chain, Quant, S 278 (*) <5 mIU/mL  CBC WITH DIFFERENTIAL     Status: None   Collection Time    04/28/14 11:48 AM      Result Value Ref Range   WBC 4.2  4.0 - 10.5 K/uL   RBC 4.36  3.87 - 5.11 MIL/uL   Hemoglobin 12.0  12.0 - 15.0 g/dL   HCT 16.1  09.6 - 04.5 %   MCV 87.4  78.0 - 100.0 fL   MCH 27.5  26.0 - 34.0 pg   MCHC 31.5  30.0 - 36.0 g/dL   RDW 40.9  81.1 - 91.4 %   Platelets 267  150 - 400 K/uL   Neutrophils Relative % 54  43 - 77 %   Neutro Abs 2.3  1.7 -  7.7 K/uL   Lymphocytes Relative 34  12 - 46 %   Lymphs Abs 1.4  0.7 - 4.0 K/uL   Monocytes Relative 11  3 - 12 %   Monocytes Absolute 0.5  0.1 - 1.0 K/uL   Eosinophils Relative 1  0 - 5 %   Eosinophils Absolute 0.0  0.0 - 0.7 K/uL  Basophils Relative 0  0 - 1 %   Basophils Absolute 0.0  0.0 - 0.1 K/uL    US Ob Comp Less 14 Wks  04/28/2014   CLINICAL DATA:  Left lower quadrant pain.  EXAM: TRANSVAGINAL OB ULTRASOUND; OBSTETRIC <14 WK ULTRASOUND  TECHNIQUE: Transvaginal ultrasound was performed for complete evaluation of the gestation as well as the maternal uterus, adnexal regions, and pelvic cul-de-sac.  COMPARISON:  03/05/2014  FINDINGS: Intrauterine gestational sac: Not visualized  Yolk sac:  Not visualized  Embryo:  Not visualized  Maternal uterus/adnexae: Bilateral ovarian follicles. No adnexal masses. Endometrium 6 mm in thickness. No free fluid.  IMPRESSION: No intrauterine pregnancy visualized. Differential considerations would include early intrauterine pregnancy too early to visualize, spontaneous abortion, or occult ectopic pregnancy. Recommend close clinical followup and serial quantitative beta HCGs and ultrasounds.   Electronically Signed   By: Charlett Nose M.D.   On: 04/28/2014 13:16   US Ob Transvaginal  04/28/2014   CLINICAL DATA:  Left lower quadrant pain.  EXAM: TRANSVAGINAL OB ULTRASOUND; OBSTETRIC <14 WK ULTRASOUND  TECHNIQUE: Transvaginal ultrasound was performed for complete evaluation of the gestation as well as the maternal uterus, adnexal regions, and pelvic cul-de-sac.  COMPARISON:  03/05/2014  FINDINGS: Intrauterine gestational sac: Not visualized  Yolk sac:  Not visualized  Embryo:  Not visualized  Maternal uterus/adnexae: Bilateral ovarian follicles. No adnexal masses. Endometrium 6 mm in thickness. No free fluid.  IMPRESSION: No intrauterine pregnancy visualized. Differential considerations would include early intrauterine pregnancy too early to visualize, spontaneous  abortion, or occult ectopic pregnancy. Recommend close clinical followup and serial quantitative beta HCGs and ultrasounds.   Electronically Signed   By: Charlett Nose M.D.   On: 04/28/2014 13:16   Consult with Dr. Despina Hidden on for OB/GYN. Patient to follow up with her doctor as scheduled in 5 days. Strict ectopic precautions.  MDM  23 y.o. female with pelvic pain in early pregnancy and abnormal rise in bhcg. The Bhcg increased from 110 on 8/3 to 278 today would have expected it to double every 48 hours. Would have expected Bhcg today to be 440. Discussed with patient strict ectopic precautions and close follow up with her doctor that is doing her prenatal care. She does have a follow up appointment in 5 days. Stable for discharge without vaginal bleeding or pain at this time. Instructed to follow up immediately for any problems. Patient voices understanding and agrees with plan.     [facility] Orlene Och, [place] 04/28/14 1655

## 2014-04-28 NOTE — Discharge Instructions (Signed)
Your ultrasound today shows no pregnancy in the uterus but no pregnancy outside the uterus. You will need close follow up to make sure the pregnancy hormone level is rising and a follow up ultrasound. No tampons, no sex, nothing in the vaginal until your doctor tells you it is ok. No heavy lifting.  Keep your appointment with your doctor for next week. Return as needed for problems.  Your pregnancy hormone level today is 278. The level on 8/5 was 110.   Abdominal Pain During Pregnancy Belly (abdominal) pain is common during pregnancy. Most of the time, it is not a serious problem. Other times, it can be a sign that something is wrong with the pregnancy. Always tell your doctor if you have belly pain. HOME CARE Monitor your belly pain for any changes. The following actions may help you feel better:  Do not have sex (intercourse) or put anything in your vagina until you feel better.  Rest until your pain stops.  Drink clear fluids if you feel sick to your stomach (nauseous). Do not eat solid food until you feel better.  Only take medicine as told by your doctor.  Keep all doctor visits as told. GET HELP RIGHT AWAY IF:   You are bleeding, leaking fluid, or pieces of tissue come out of your vagina.  You have more pain or cramping.  You keep throwing up (vomiting).  You have pain when you pee (urinate) or have blood in your pee.  You have a fever.  You do not feel your baby moving as much.  You feel very weak or feel like passing out.  You have trouble breathing, with or without belly pain.  You have a very bad headache and belly pain.  You have fluid leaking from your vagina and belly pain.  You keep having watery poop (diarrhea).  Your belly pain does not go away after resting, or the pain gets worse. MAKE SURE YOU:   Understand these instructions.  Will watch your condition.  Will get help right away if you are not doing well or get worse. Document Released: 08/27/2009  Document Revised: 05/11/2013 Document Reviewed: 04/07/2013 [facility] Patient Information 2015 [place]. This information is not intended to replace advice given to you by your health care provider. Make sure you discuss any questions you have with your health care provider.

## 2014-04-28 NOTE — ED Notes (Signed)
nad noted prior to dc. Dc instructions reviewed with pt and explained. Voiced understanding. Ambulated out without difficulty.

## 2014-04-29 NOTE — ED Provider Notes (Signed)
Medical screening examination/treatment/procedure(s) were performed by non-physician practitioner and as supervising physician I was immediately available for consultation/collaboration.   EKG Interpretation None        Joya Gaskinsonald W Elmond Poehlman, MD 04/29/14 [phone]

## 2014-05-03 ENCOUNTER — Ambulatory Visit (INDEPENDENT_AMBULATORY_CARE_PROVIDER_SITE_OTHER): Payer: Medicaid Other | Admitting: Family Medicine

## 2014-05-03 ENCOUNTER — Encounter: Payer: Self-pay | Admitting: Family Medicine

## 2014-05-03 ENCOUNTER — Other Ambulatory Visit: Payer: Medicaid Other

## 2014-05-03 VITALS — BP 131/83 | HR 80 | Temp 98.4°F | Ht 66.0 in | Wt 155.0 lb

## 2014-05-03 DIAGNOSIS — Z3481 Encounter for supervision of other normal pregnancy, first trimester: Secondary | ICD-10-CM

## 2014-05-03 DIAGNOSIS — R109 Unspecified abdominal pain: Secondary | ICD-10-CM

## 2014-05-03 DIAGNOSIS — O26899 Other specified pregnancy related conditions, unspecified trimester: Secondary | ICD-10-CM

## 2014-05-03 DIAGNOSIS — Z3480 Encounter for supervision of other normal pregnancy, unspecified trimester: Secondary | ICD-10-CM

## 2014-05-03 NOTE — Assessment & Plan Note (Addendum)
Possible ectopic, borderline hcg with change from 110 to 278 over 85 hours (would expect minimum 303 over 96 hours) - recheck hcg today - go to [facility] if any bleeding or worsening pain - f/u with me in 1 week for new ob visit and possible recheck - will call patient with HCG results, (normal would be >766 yesterday so > about 1000 today)

## 2014-05-03 NOTE — Patient Instructions (Signed)
I will call you with the results of your lab work today. If you have any bleeding or worsening abdominal pain [facility]. If you have any questions please call. I will see you back next week.

## 2014-05-03 NOTE — Progress Notes (Signed)
   Subjective:    Patient ID: Marissa Contreras, female    DOB: [birth year], 23 y.o.   MRN: [id number]  HPI Pt presents for f/u of recent ER visits. Pt was seen twice for back pain and then abdominal pain. She was diagnosed with early pregnancy. LMP 7/24. Her HCG increased from 110 to 278 over 85 hours. EDP recommended she follow up to have this repeated. An u/s at that time did not show a uterine or tubal pregnancy (likely because she is so early). Since then she has continued to have some crampy lower abdominal pain. No vaginal bleeding.    Review of Systems See HPI    Objective:   Physical Exam  Nursing note and vitals reviewed. Constitutional: She is oriented to person, place, and time. She appears well-developed and well-nourished. No distress.  HENT:  Head: Normocephalic and atraumatic.  Eyes: Conjunctivae are normal. Right eye exhibits no discharge. Left eye exhibits no discharge. No scleral icterus.  Cardiovascular: Normal rate.   Pulmonary/Chest: Effort normal.  Abdominal: Soft. Bowel sounds are normal. She exhibits no distension and no mass. There is no tenderness. There is no rebound and no guarding.  Musculoskeletal: She exhibits no edema.  Neurological: She is alert and oriented to person, place, and time.  Skin: Skin is warm and dry. No rash noted. She is not diaphoretic. No erythema. No pallor.  Psychiatric: She has a normal mood and affect. Her behavior is normal.          Assessment & Plan:

## 2014-05-04 ENCOUNTER — Telehealth: Payer: Self-pay | Admitting: *Deleted

## 2014-05-04 LAB — OBSTETRIC PANEL
Antibody Screen: NEGATIVE
Basophils Absolute: 0 10*3/uL (ref 0.0–0.1)
Basophils Relative: 0 % (ref 0–1)
Eosinophils Absolute: 0 10*3/uL (ref 0.0–0.7)
Eosinophils Relative: 1 % (ref 0–5)
HCT: 37.4 % (ref 36.0–46.0)
HEMOGLOBIN: 12.3 g/dL (ref 12.0–15.0)
Hepatitis B Surface Ag: NEGATIVE
LYMPHS ABS: 1.5 10*3/uL (ref 0.7–4.0)
LYMPHS PCT: 35 % (ref 12–46)
MCH: 27.4 pg (ref 26.0–34.0)
MCHC: 32.9 g/dL (ref 30.0–36.0)
MCV: 83.3 fL (ref 78.0–100.0)
MONOS PCT: 12 % (ref 3–12)
Monocytes Absolute: 0.5 10*3/uL (ref 0.1–1.0)
NEUTROS ABS: 2.3 10*3/uL (ref 1.7–7.7)
NEUTROS PCT: 52 % (ref 43–77)
PLATELETS: 273 10*3/uL (ref 150–400)
RBC: 4.49 MIL/uL (ref 3.87–5.11)
RDW: 13.1 % (ref 11.5–15.5)
RH TYPE: POSITIVE
Rubella: 1.81 Index — ABNORMAL HIGH (ref <0.90)
WBC: 4.4 10*3/uL (ref 4.0–10.5)

## 2014-05-04 LAB — SICKLE CELL SCREEN: Sickle Cell Screen: NEGATIVE

## 2014-05-04 LAB — HCG, QUANTITATIVE, PREGNANCY: HCG, BETA CHAIN, QUANT, S: 346.4 m[IU]/mL

## 2014-05-04 LAB — HIV ANTIBODY (ROUTINE TESTING W REFLEX): HIV: NONREACTIVE

## 2014-05-04 NOTE — Telephone Encounter (Signed)
Mother called concerned about her daughter and her pregnancy. She says she passed a small clot but does not have continuous bleeding. I read Dr Cherre HugerAdamo's instructions to her which say if she has bleeding or worsening abdominal pain that she should go directly to [facility]. Had mother repeat these instructions back to me.Busick, Rodena Medinobert Lee

## 2014-05-10 ENCOUNTER — Encounter: Payer: Medicaid Other | Admitting: Family Medicine

## 2014-05-24 ENCOUNTER — Telehealth: Payer: Self-pay | Admitting: Family Medicine

## 2014-05-24 DIAGNOSIS — O2620 Pregnancy care for patient with recurrent pregnancy loss, unspecified trimester: Secondary | ICD-10-CM

## 2014-05-24 NOTE — Telephone Encounter (Signed)
Mother calls, inquiring about the referral that was suppose to be placed for pt to OB/GYN for additional testing. No referral has been placed. Pls call patient/mother about this.

## 2014-05-24 NOTE — Telephone Encounter (Signed)
No mention of referral in previous office notes, will forward to PCP.

## 2014-05-26 NOTE — Telephone Encounter (Signed)
Referral placed. Please inform patient when appt is scheduled at [facility].

## 2014-05-30 NOTE — Telephone Encounter (Signed)
Tried calling to inform patient mother, line just rings then goes to a busy signal.

## 2014-05-30 NOTE — Telephone Encounter (Signed)
[facility] does not accept medicaid referral placed in [facility] workqueue, they will contact patient with appointment.

## 2014-06-08 ENCOUNTER — Encounter: Payer: Self-pay | Admitting: Family

## 2014-06-08 ENCOUNTER — Ambulatory Visit (INDEPENDENT_AMBULATORY_CARE_PROVIDER_SITE_OTHER): Payer: Medicaid Other | Admitting: Obstetrics and Gynecology

## 2014-06-08 ENCOUNTER — Encounter: Payer: Self-pay | Admitting: Obstetrics and Gynecology

## 2014-06-08 ENCOUNTER — Ambulatory Visit: Payer: Medicaid Other | Admitting: Family

## 2014-06-08 VITALS — BP 113/71 | HR 82 | Wt 153.6 lb

## 2014-06-08 DIAGNOSIS — O2621 Pregnancy care for patient with recurrent pregnancy loss, first trimester: Secondary | ICD-10-CM

## 2014-06-08 DIAGNOSIS — O2620 Pregnancy care for patient with recurrent pregnancy loss, unspecified trimester: Secondary | ICD-10-CM

## 2014-06-08 LAB — POCT URINALYSIS DIP (DEVICE)
Bilirubin Urine: NEGATIVE
Glucose, UA: NEGATIVE mg/dL
Ketones, ur: NEGATIVE mg/dL
Leukocytes, UA: NEGATIVE
Nitrite: NEGATIVE
PROTEIN: NEGATIVE mg/dL
SPECIFIC GRAVITY, URINE: 1.015 (ref 1.005–1.030)
UROBILINOGEN UA: 0.2 mg/dL (ref 0.0–1.0)
pH: 6.5 (ref 5.0–8.0)

## 2014-06-08 NOTE — Progress Notes (Signed)
Patient ID: Marissa Contreras, female   DOB: 11/18/1990, 23 y.o.   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 Patient experienced 1 first trimester miscarriage, an ectopic pregnancy, followed by a full term pregnancy, and now 2 subsequent pregnancy loss (one was at [redacted] weeks GA and the other [redacted] weeks GA) . Patient reports normal genetic testing done on 15 week fetus. Patient has been with same partner for all of these pregnancies. Patient's most recent loss was in August at about [redacted] weeks GA.  Past Medical History  Diagnosis Date  . H/O varicella   . Back pain   . Chronic back pain   . Asthma    Past Surgical History  Procedure Laterality Date  . Tonsillectomy    . Dilation and evacuation N/A 03/08/2014    Procedure: DILATATION AND EVACUATION ;  Surgeon: Purcell Nails, MD;  Location: [facility];  Service: Gynecology;  Laterality: N/A;  with chromosomal studies, sent    Family History  Problem Relation Age of Onset  . Hypertension Father   . Diabetes Father   . Fibromyalgia Mother   . Arthritis Mother   . Depression Mother   . Diabetes Paternal Grandmother   . Cancer Paternal Grandmother   . Stroke Paternal Grandmother    History  Substance Use Topics  . Smoking status: Former Smoker    Quit date: 11/21/2011  . Smokeless tobacco: Never Used     Comment: e-cig  . 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

## 2014-06-08 NOTE — Progress Notes (Signed)
LMP 06/02/14, Denies any pain today

## 2014-06-08 NOTE — Progress Notes (Signed)
Genetic counseling with MFM 06/13/14 @ 9a.

## 2014-06-09 LAB — LUPUS ANTICOAGULANT PANEL
DRVVT: 32.3 secs (ref <42.9)
Lupus Anticoagulant: NOT DETECTED
PTT Lupus Anticoagulant: 34 secs (ref 28.0–43.0)

## 2014-06-09 LAB — PROTEIN C ACTIVITY: PROTEIN C ACTIVITY: 178 % — AB (ref 75–133)

## 2014-06-09 LAB — CARDIOLIPIN ANTIBODIES, IGM+IGG
ANTICARDIOLIPIN IGG: 3 GPL U/mL (ref <23)
ANTICARDIOLIPIN IGM: 0 [MPL'U]/mL (ref <11)

## 2014-06-09 LAB — PROTEIN S ACTIVITY: PROTEIN S ACTIVITY: 90 % (ref 69–129)

## 2014-06-09 LAB — TSH: TSH: 0.443 u[IU]/mL (ref 0.350–4.500)

## 2014-06-12 LAB — FACTOR 5 LEIDEN

## 2014-06-12 LAB — PROTEIN S, TOTAL: Protein S Total: 74 % (ref 60–150)

## 2014-06-12 LAB — PROTEIN C, TOTAL: Protein C, Total: 96 % (ref 72–160)

## 2014-06-13 ENCOUNTER — Ambulatory Visit (HOSPITAL_COMMUNITY)
Admission: RE | Admit: 2014-06-13 | Discharge: 2014-06-13 | Disposition: A | Discharge status: Home / Self Care | Payer: Medicaid Other | Source: Ambulatory Visit | Attending: Obstetrics and Gynecology | Admitting: Obstetrics and Gynecology

## 2014-06-13 ENCOUNTER — Other Ambulatory Visit: Payer: Self-pay

## 2014-06-13 ENCOUNTER — Ambulatory Visit: Payer: Medicaid Other | Admitting: Family Medicine

## 2014-06-13 ENCOUNTER — Ambulatory Visit (HOSPITAL_COMMUNITY)
Admission: RE | Admit: 2014-06-13 | Discharge: 2014-06-13 | Disposition: A | Discharge status: Home / Self Care | Payer: Medicaid Other | Source: Ambulatory Visit | Attending: Family | Admitting: Family

## 2014-06-13 DIAGNOSIS — N96 Recurrent pregnancy loss: Secondary | ICD-10-CM | POA: Insufficient documentation

## 2014-06-13 DIAGNOSIS — O2620 Pregnancy care for patient with recurrent pregnancy loss, unspecified trimester: Secondary | ICD-10-CM

## 2014-06-13 DIAGNOSIS — Z87891 Personal history of nicotine dependence: Secondary | ICD-10-CM | POA: Insufficient documentation

## 2014-06-13 LAB — ROUTINE CHROMOSOME - KARYOTYPE

## 2014-06-13 NOTE — Consult Note (Signed)
Maternal Fetal Medicine Consultation  Requesting Provider(s): Catalina Antigua, MD  Reason for consultation: Recurrent pregnancy losses  HPI: Marissa Contreras is a 23 year old [id number] who was seen for consultation due to recurrent pregnancy losses.  Her past OB history is as follows:  1) Aug 2011 - 6 week SAB.  Reports that fetal heart tones were documented prior to loss 2) Oct 2012 - 7 week SAB. "blighted ovum" 3) April 2014 - term SVD without complications 4) June 2015 - 14-15 week loss.  Reports "rupture in the placenta" (? Subchorionic bleed) - required D&C -reports normal chromosomes on products of conception 5) Aug 2015 - early loss - "chemical pregnancy"  Marissa Contreras underwent a thrombophilia work up.  Her lupus anticoagulant and anticardiolipin antibodies were negative. FVLeiden was negative.  No evidence of protein C or S deficiency.  TSH was within normal limits  OB History: OB History   Grav Para Term Preterm Abortions TAB SAB Ect Mult Living   PMH:  Past Medical History  Diagnosis Date  . H/O varicella   . Back pain   . Chronic back pain   . Asthma     PSH:  Past Surgical History  Procedure Laterality Date  . Tonsillectomy    . Dilation and evacuation N/A 03/08/2014    Procedure: DILATATION AND EVACUATION ;  Surgeon: Purcell Nails, MD;  Location: [facility];  Service: Gynecology;  Laterality: N/A;  with chromosomal studies, sent     Meds:  Current Outpatient Prescriptions on File Prior to Encounter  Medication Sig Dispense Refill  . acetaminophen (TYLENOL) 500 MG tablet Take 500 mg by mouth every 6 (six) hours as needed for moderate pain.      Marland Kitchen albuterol (PROVENTIL HFA;[name] 108 (90 BASE) MCG/ACT inhaler Inhale 2 puffs into the lungs every 6 (six) hours as needed for wheezing or shortness of breath.      . cyclobenzaprine (FLEXERIL) 5 MG tablet Take 5 mg by mouth at bedtime.      Marland Kitchen FLUoxetine (PROZAC) 40 MG capsule Take 1 capsule (40 mg  total) by mouth daily.  30 capsule  12   No current facility-administered medications on file prior to encounter.   Allergies: No Known Allergies  FH:  Family History  Problem Relation Age of Onset  . Hypertension Father   . Diabetes Father   . Fibromyalgia Mother   . Arthritis Mother   . Depression Mother   . Diabetes Paternal Grandmother   . Cancer Paternal Grandmother   . Stroke Paternal Grandmother   Denies family history of birth defects or hereditary disorders  Soc: History   Social History  . Marital Status: Married    Spouse Name: N/A    Number of Children: N/A  . Years of Education: N/A   Occupational History  . Not on file.   Social History Main Topics  . Smoking status: Former Smoker    Quit date: 11/21/2011  . Smokeless tobacco: Never Used     Comment: e-cig  . Alcohol Use: No  . Drug Use: No  . Sexual Activity: Yes    Birth Control/ Protection: Condom   Other Topics Concern  . Not on file   Social History Narrative   Lives with husband and mother in law.  Husband is 6 years older, works at [employer].  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     PE:  154#, 129/75, 77  A/P: 1) Hx of recurrent pregnancy losses - We had a long discussion regarding likelihood of additional pregnancy losses.  The fact that the patient had one successful term vaginal delivery improves her prognosis significantly.  We reviewed the laboratory work up to date.  I explained that the likelihood of finding any explanation for recurrent losses is unlikely.  We reviewed the risk of balanced translocations in either the patient or her spouse - this occurs in approximately 3-5% of patients with recurrent losses.  After counseling, the patient and her husband elected to have chromosomes testing performed which was drawn following out visit.  Questions were answered to the patient's satisfaction.   Thank  you for the opportunity to be a part of the care of Marissa Contreras. Please contact our office if we can be of further assistance.   I spent approximately 30 minutes with this patient with over 50% of time spent in face-to-face counseling.  Alpha Gula, MD Maternal Fetal Medicine

## 2014-06-23 ENCOUNTER — Telehealth (HOSPITAL_COMMUNITY): Payer: Self-pay | Admitting: MS"

## 2014-06-23 NOTE — Telephone Encounter (Signed)
Left message for patient to return call.   Marissa Contreras 06/23/2014 4:19 PM

## 2014-06-26 ENCOUNTER — Telehealth (HOSPITAL_COMMUNITY): Payer: Self-pay | Admitting: MS"

## 2014-06-26 NOTE — Telephone Encounter (Signed)
Attempted to call patient regarding normal peripheral blood chromosome results for patient and her partner. Left message for patient to return call to follow-up from her appointment last week.   Marissa BraunKaren Ozie Lupe 06/26/2014 11:10 AM

## 2014-06-29 ENCOUNTER — Telehealth (HOSPITAL_COMMUNITY): Payer: Self-pay | Admitting: MS"

## 2014-06-29 NOTE — Telephone Encounter (Signed)
Attempted to call patient regarding normal peripheral blood chromosome analysis for her and her partner. Patient's mother answered the phone. Left message with her mother for patient to return call to [phone].   Clydie BraunKaren Hilari Wethington 06/29/2014 2:34 PM

## 2014-06-29 NOTE — Telephone Encounter (Signed)
Called [name] regarding results of peripheral blood chromosome analysis performed for herself and for her partner, [name]. This testing was offered given a history of recurrent pregnancy loss. Patient and partner were identified by name and DOB. Discussed that this analysis revealed apparently normal female chromosomes for [name] (46,XX) and apparently normal female chromosomes for [name] (46,XY). Thus, reviewed that an underlying chromosome rearrangement does not appear to be the cause of recurrent miscarriage and that future pregnancies would not be at increased risk for a chromosome condition. Patient asked if there were any additional tests that could be done. Reviewed Dr. Fredda HammedWhitecar's MFM consult note which does not state additional testing at this time to be performed for recurrent pregnancy loss. Reviewed that approximately 50% of cases of recurrent pregnancy loss do not have an identified cause. Patient was encouraged to call back with additional questions or concerns.   Clydie BraunKaren Riki Gehring 06/29/2014 3:43 PM

## 2014-07-24 ENCOUNTER — Encounter: Payer: Self-pay | Admitting: Obstetrics and Gynecology

## 2014-08-08 ENCOUNTER — Emergency Department (HOSPITAL_COMMUNITY)
Admission: EM | Admit: 2014-08-08 | Discharge: 2014-08-08 | Disposition: A | Discharge status: Home / Self Care | Payer: Medicaid Other | Attending: Emergency Medicine | Admitting: Emergency Medicine

## 2014-08-08 ENCOUNTER — Encounter (HOSPITAL_COMMUNITY): Payer: Self-pay | Admitting: *Deleted

## 2014-08-08 DIAGNOSIS — R21 Rash and other nonspecific skin eruption: Secondary | ICD-10-CM | POA: Diagnosis not present

## 2014-08-08 DIAGNOSIS — Z3201 Encounter for pregnancy test, result positive: Secondary | ICD-10-CM | POA: Diagnosis not present

## 2014-08-08 DIAGNOSIS — M25551 Pain in right hip: Secondary | ICD-10-CM | POA: Insufficient documentation

## 2014-08-08 DIAGNOSIS — Z87891 Personal history of nicotine dependence: Secondary | ICD-10-CM | POA: Diagnosis not present

## 2014-08-08 DIAGNOSIS — Z8619 Personal history of other infectious and parasitic diseases: Secondary | ICD-10-CM | POA: Insufficient documentation

## 2014-08-08 DIAGNOSIS — R51 Headache: Secondary | ICD-10-CM | POA: Diagnosis present

## 2014-08-08 DIAGNOSIS — Z79899 Other long term (current) drug therapy: Secondary | ICD-10-CM | POA: Insufficient documentation

## 2014-08-08 DIAGNOSIS — G8929 Other chronic pain: Secondary | ICD-10-CM | POA: Insufficient documentation

## 2014-08-08 DIAGNOSIS — J45909 Unspecified asthma, uncomplicated: Secondary | ICD-10-CM | POA: Diagnosis not present

## 2014-08-08 DIAGNOSIS — Z331 Pregnant state, incidental: Secondary | ICD-10-CM | POA: Diagnosis not present

## 2014-08-08 DIAGNOSIS — R110 Nausea: Secondary | ICD-10-CM | POA: Insufficient documentation

## 2014-08-08 DIAGNOSIS — Z3490 Encounter for supervision of normal pregnancy, unspecified, unspecified trimester: Secondary | ICD-10-CM

## 2014-08-08 LAB — BASIC METABOLIC PANEL
Anion gap: 10 (ref 5–15)
BUN: 13 mg/dL (ref 6–23)
CALCIUM: 9.5 mg/dL (ref 8.4–10.5)
CO2: 24 mEq/L (ref 19–32)
Chloride: 103 mEq/L (ref 96–112)
Creatinine, Ser: 0.58 mg/dL (ref 0.50–1.10)
Glucose, Bld: 113 mg/dL — ABNORMAL HIGH (ref 70–99)
Potassium: 3.9 mEq/L (ref 3.7–5.3)
Sodium: 137 mEq/L (ref 137–147)

## 2014-08-08 LAB — CBC WITH DIFFERENTIAL/PLATELET
BASOS PCT: 0 % (ref 0–1)
Basophils Absolute: 0 10*3/uL (ref 0.0–0.1)
EOS PCT: 0 % (ref 0–5)
Eosinophils Absolute: 0 10*3/uL (ref 0.0–0.7)
HCT: 37.6 % (ref 36.0–46.0)
Hemoglobin: 12.5 g/dL (ref 12.0–15.0)
Lymphocytes Relative: 25 % (ref 12–46)
Lymphs Abs: 2.3 10*3/uL (ref 0.7–4.0)
MCH: 27.5 pg (ref 26.0–34.0)
MCHC: 33.2 g/dL (ref 30.0–36.0)
MCV: 82.6 fL (ref 78.0–100.0)
Monocytes Absolute: 0.7 10*3/uL (ref 0.1–1.0)
Monocytes Relative: 8 % (ref 3–12)
NEUTROS PCT: 67 % (ref 43–77)
Neutro Abs: 6.2 10*3/uL (ref 1.7–7.7)
Platelets: 264 10*3/uL (ref 150–400)
RBC: 4.55 MIL/uL (ref 3.87–5.11)
RDW: 15.5 % (ref 11.5–15.5)
WBC: 9.3 10*3/uL (ref 4.0–10.5)

## 2014-08-08 LAB — URINALYSIS, ROUTINE W REFLEX MICROSCOPIC
Bilirubin Urine: NEGATIVE
GLUCOSE, UA: NEGATIVE mg/dL
Hgb urine dipstick: NEGATIVE
KETONES UR: NEGATIVE mg/dL
LEUKOCYTES UA: NEGATIVE
Nitrite: NEGATIVE
PROTEIN: NEGATIVE mg/dL
Specific Gravity, Urine: 1.025 (ref 1.005–1.030)
UROBILINOGEN UA: 0.2 mg/dL (ref 0.0–1.0)
pH: 6 (ref 5.0–8.0)

## 2014-08-08 LAB — PREGNANCY, URINE: Preg Test, Ur: POSITIVE — AB

## 2014-08-08 MED ORDER — METOCLOPRAMIDE HCL 5 MG/ML IJ SOLN: 10.0000 mg | Freq: Once | INTRAMUSCULAR | Status: DC

## 2014-08-08 MED ORDER — SODIUM CHLORIDE 0.9 % IV BOLUS (SEPSIS)
1000.0000 mL | Freq: Once | INTRAVENOUS | Status: AC
  Administered 2014-08-08: 1000 mL via INTRAVENOUS

## 2014-08-08 MED ORDER — DIPHENHYDRAMINE HCL 50 MG/ML IJ SOLN
25.0000 mg | Freq: Once | INTRAMUSCULAR | Status: AC
  Administered 2014-08-08: 25 mg via INTRAVENOUS
  Filled 2014-08-08: qty 1

## 2014-08-08 NOTE — ED Provider Notes (Signed)
CSN: [id number]     Arrival date & time 08/08/14  1352 History   First MD Initiated Contact with Patient 08/08/14 1804     Chief Complaint  Patient presents with  . Headache  . Nausea  . Hip Pain   (Consider location/radiation/quality/duration/timing/severity/associated sxs/prior Treatment) HPI  Marissa Contreras is a 23 yo female presenting with complaint of a headache x 2 weeks.  She states the headache has been intermittent and currently rates it as a 3/10.  She reports a rash that occurs nightly for the last week and describes it as itchy and burning.  She has treated the rash with benadryl with temporary relief.  She reports chronic joint pain but reports in the last week her right hip has been more sore.  She rates this pain as 5/10.  She also reports she is unsure if she is pregnant or not and she is not sure of her LMP.  She denies frequent exposure to outdoors, general muscle or joint aches,  fevers, chills, nausea, vomiting, abd pain, vaginal discharge or bleeding.  Past Medical History  Diagnosis Date  . H/O varicella   . Back pain   . Chronic back pain   . Asthma    Past Surgical History  Procedure Laterality Date  . Tonsillectomy    . Dilation and evacuation N/A 03/08/2014    Procedure: DILATATION AND EVACUATION ;  Surgeon: Purcell NailsAngela Y Roberts, MD;  Location: [facility];  Service: Gynecology;  Laterality: N/A;  with chromosomal studies, sent    Family History  Problem Relation Age of Onset  . Hypertension Father   . Diabetes Father   . Fibromyalgia Mother   . Arthritis Mother   . Depression Mother   . Diabetes Paternal Grandmother   . Cancer Paternal Grandmother   . Stroke Paternal Grandmother    History  Substance Use Topics  . Smoking status: Former Smoker    Quit date: 11/21/2011  . Smokeless tobacco: Never Used     Comment: e-cig  . Alcohol Use: No   OB History    Gravida Para Term Preterm AB TAB SAB Ectopic Multiple Living   5 1 1  4  4   1      Review of Systems   Constitutional: Negative for fever and chills.  HENT: Negative for sore throat.   Eyes: Negative for visual disturbance.  Respiratory: Negative for cough and shortness of breath.   Cardiovascular: Negative for chest pain and leg swelling.  Gastrointestinal: Negative for nausea, vomiting and diarrhea.  Genitourinary: Negative for dysuria.  Musculoskeletal: Positive for arthralgias. Negative for myalgias.  Skin: Positive for rash.  Neurological: Positive for headaches. Negative for weakness and numbness.    Allergies  Review of patient's allergies indicates no known allergies.  Home Medications   Prior to Admission medications   Medication Sig Start Date End Date Taking? Authorizing Provider  acetaminophen (TYLENOL) 500 MG tablet Take 500 mg by mouth every 6 (six) hours as needed for moderate pain.    Historical Provider, MD  albuterol (PROVENTIL HFA;[name] 108 (90 BASE) MCG/ACT inhaler Inhale 2 puffs into the lungs every 6 (six) hours as needed for wheezing or shortness of breath.    Historical Provider, MD  cyclobenzaprine (FLEXERIL) 5 MG tablet Take 5 mg by mouth at bedtime.    Historical Provider, MD  FLUoxetine (PROZAC) 40 MG capsule Take 1 capsule (40 mg total) by mouth daily. 03/17/14   Abram SanderElena M Adamo, MD   BP 450-683-3770118/74  mmHg  Pulse 103  Temp(Src) 98.3 F (36.8 C) (Oral)  Resp 20  SpO2 100%  LMP 06/26/2014 (LMP Unknown) Physical Exam  Constitutional: She is oriented to person, place, and time. She appears well-developed and well-nourished. No distress.  HENT:  Head: Normocephalic and atraumatic.  Mouth/Throat: Oropharynx is clear and moist. No oropharyngeal exudate.  Eyes: Conjunctivae are normal.  Neck: Neck supple. No thyromegaly present.  Cardiovascular: Normal rate, regular rhythm and intact distal pulses.   Pulmonary/Chest: Effort normal and breath sounds normal. No respiratory distress. She has no wheezes. She has no rales. She exhibits no tenderness.  Abdominal:  Soft. There is no tenderness.  Musculoskeletal: She exhibits tenderness.       Right hip: She exhibits tenderness. She exhibits normal range of motion, normal strength, no bony tenderness and no swelling.       Legs: Lymphadenopathy:    She has no cervical adenopathy.  Neurological: She is alert and oriented to person, place, and time. No cranial nerve deficit. Coordination normal.  Skin: Skin is warm and dry. Rash noted. Rash is maculopapular. She is not diaphoretic.  Psychiatric: She has a normal mood and affect.  Nursing note and vitals reviewed.   ED Course  Procedures (including critical care time) Labs Review Labs Reviewed  BASIC METABOLIC PANEL - Abnormal; Notable for the following:    Glucose, Bld 113 (*)    All other components within normal limits  URINALYSIS, ROUTINE W REFLEX MICROSCOPIC - Abnormal; Notable for the following:    APPearance HAZY (*)    All other components within normal limits  PREGNANCY, URINE - Abnormal; Notable for the following:    Preg Test, Ur POSITIVE (*)    All other components within normal limits  CBC WITH DIFFERENTIAL    Imaging Review No results found.   EKG Interpretation None      MDM   Final diagnoses:  Pregnancy   23 yo female with headache and intermittent rash.  Pt has incidental finding of positive pregnancy test.  Doubt ICH or infectious source of symptoms based on her history physical and normal lab results.  NS fluid bolus given and benadryl for recurrent maculopapular rash. Pt well-appearing and vital signs stable, pt non toxic and not in acute distress. Pt does not have any abd pain, cramping or vaginal bleeding. Discharge instructions include [facility] to establish pre-[facility] care. Pt aware of plan and in agreement.Return precautions provided.     Filed Vitals:   08/08/14 1930 08/08/14 1935 08/08/14 2004 08/08/14 2030  BP:  112/72 106/63 110/50  Pulse: 102 66  87 92  Temp:      TempSrc:      Resp:      SpO2: 100% 100% 100% 100%   Meds given in ED:  Medications  diphenhydrAMINE (BENADRYL) injection 25 mg (25 mg Intravenous Given 08/08/14 1942)  sodium chloride 0.9 % bolus 1,000 mL (0 mLs Intravenous Stopped 08/08/14 2103)    Discharge Medication List as of 08/08/2014  8:53 PM         Harle BattiestElizabeth Jobanny Mavis, NP 08/10/14 [id number]  Gilda Creasehristopher J. Pollina, MD 08/14/14 1040

## 2014-08-08 NOTE — Discharge Instructions (Signed)
Please follow the directions provided.  Be [facility] tomorrow to establish pre-[facility] care.  You may take tylenol for pain and benadryl for itching and rash.  Avoid other medicines until [facility].  Don't hesitate to return for new, worsening or concerning symptoms.      SEEK IMMEDIATE MEDICAL CARE IF:  You have a fever.  You are leaking fluid from your vagina.  You have spotting or bleeding from your vagina.  You have severe abdominal cramping or pain.  You have rapid weight gain or loss.  You vomit blood or material that looks like coffee grounds.  You are exposed to [place] measles and have never had them.  You are exposed to fifth disease or chickenpox.  You develop a severe headache.  You have shortness of breath.  You have any kind of trauma, such as from a fall or a car accident.

## 2014-08-08 NOTE — ED Notes (Signed)
Rt. Hip, frequent h/as' and nausea.

## 2014-08-24 ENCOUNTER — Other Ambulatory Visit: Payer: Self-pay | Admitting: Family Medicine

## 2014-08-24 DIAGNOSIS — Z3481 Encounter for supervision of other normal pregnancy, first trimester: Secondary | ICD-10-CM

## 2014-08-24 NOTE — Telephone Encounter (Signed)
Will forward to PCP, patient scheduled for NOB later this month.

## 2014-08-24 NOTE — Telephone Encounter (Signed)
Mother called because her daughter Marissa Contreras is pregnant again this year and is having a lot of nausea. She is requesting a refill on her medication that she take for that. [facility] [place] jw

## 2014-08-27 ENCOUNTER — Emergency Department (HOSPITAL_COMMUNITY)
Admission: EM | Admit: 2014-08-27 | Discharge: 2014-08-27 | Disposition: A | Discharge status: Home / Self Care | Payer: Medicaid Other | Attending: Emergency Medicine | Admitting: Emergency Medicine

## 2014-08-27 ENCOUNTER — Encounter (HOSPITAL_COMMUNITY): Payer: Self-pay | Admitting: Emergency Medicine

## 2014-08-27 DIAGNOSIS — Y939 Activity, unspecified: Secondary | ICD-10-CM | POA: Insufficient documentation

## 2014-08-27 DIAGNOSIS — Z3A00 Weeks of gestation of pregnancy not specified: Secondary | ICD-10-CM | POA: Diagnosis not present

## 2014-08-27 DIAGNOSIS — O9A219 Injury, poisoning and certain other consequences of external causes complicating pregnancy, unspecified trimester: Secondary | ICD-10-CM | POA: Insufficient documentation

## 2014-08-27 DIAGNOSIS — Y998 Other external cause status: Secondary | ICD-10-CM | POA: Diagnosis not present

## 2014-08-27 DIAGNOSIS — W57XXXA Bitten or stung by nonvenomous insect and other nonvenomous arthropods, initial encounter: Secondary | ICD-10-CM | POA: Insufficient documentation

## 2014-08-27 DIAGNOSIS — J45909 Unspecified asthma, uncomplicated: Secondary | ICD-10-CM | POA: Diagnosis not present

## 2014-08-27 DIAGNOSIS — Y929 Unspecified place or not applicable: Secondary | ICD-10-CM | POA: Diagnosis not present

## 2014-08-27 DIAGNOSIS — S90862A Insect bite (nonvenomous), left foot, initial encounter: Secondary | ICD-10-CM | POA: Diagnosis not present

## 2014-08-27 DIAGNOSIS — Z87891 Personal history of nicotine dependence: Secondary | ICD-10-CM | POA: Insufficient documentation

## 2014-08-27 DIAGNOSIS — Z8619 Personal history of other infectious and parasitic diseases: Secondary | ICD-10-CM | POA: Diagnosis not present

## 2014-08-27 DIAGNOSIS — Z3490 Encounter for supervision of normal pregnancy, unspecified, unspecified trimester: Secondary | ICD-10-CM

## 2014-08-27 NOTE — ED Notes (Addendum)
Pt reports was bitten by something on Friday and reports three bite sites. One to right foot and two to left thigh. Pt reports took otc medicine with no relief. rash noted to right foot.airway patent. nad noted. Pt also reports is pregnant, approximately one month.

## 2014-08-27 NOTE — ED Provider Notes (Signed)
CSN: [id number]     Arrival date & time 08/27/14  1134 History  This chart was scribed for non-physician practitioner, Ivery QualeHobson Chayton Murata, PA-C,working with Hilario Quarryanielle S Ray, MD, by Karle PlumberJennifer Tensley, ED Scribe. This patient was seen in room APFT24/APFT24 and the patient's care was started at 2:02 PM.  Chief Complaint  Patient presents with  . Insect Bite   The history is provided by the patient. No language interpreter was used.    HPI Comments:  Marissa Contreras is a 23 y.o. pregnant female at an unknown gestation, who presents to the Emergency Department complaining of insect bites to bilateral feet that she sustained two days ago. She reports the bites are itchy and red. She has taken Benadryl and using Hydrocortisone cream with no relief of the symptoms. Ice and heat make the areas itch worse. Denies fever or chills. Denies h/o DM. PMH of chronic back pain and asthma. Reports finding out she was pregnant approximately 3 weeks ago (08/08/14).  Past Medical History  Diagnosis Date  . H/O varicella   . Back pain   . Chronic back pain   . Asthma    Past Surgical History  Procedure Laterality Date  . Tonsillectomy    . Dilation and evacuation N/A 03/08/2014    Procedure: DILATATION AND EVACUATION ;  Surgeon: Purcell NailsAngela Y Roberts, MD;  Location: [facility];  Service: Gynecology;  Laterality: N/A;  with chromosomal studies, sent    Family History  Problem Relation Age of Onset  . Hypertension Father   . Diabetes Father   . Fibromyalgia Mother   . Arthritis Mother   . Depression Mother   . Diabetes Paternal Grandmother   . Cancer Paternal Grandmother   . Stroke Paternal Grandmother    History  Substance Use Topics  . Smoking status: Former Smoker    Quit date: 11/21/2011  . Smokeless tobacco: Never Used     Comment: e-cig  . Alcohol Use: No   OB History    Gravida Para Term Preterm AB TAB SAB Ectopic Multiple Living   5 1 1  4  4   1      Review of Systems  Constitutional: Negative for fever  and chills.  Skin: Positive for rash.    Allergies  Review of patient's allergies indicates no known allergies.  Home Medications   Prior to Admission medications   Medication Sig Start Date End Date Taking? Authorizing Provider  diphenhydrAMINE (BENADRYL) 25 MG tablet Take 25 mg by mouth every 6 (six) hours as needed for itching.   Yes Historical Provider, MD  acetaminophen (TYLENOL) 500 MG tablet Take 500 mg by mouth every 6 (six) hours as needed for moderate pain.    Historical Provider, MD  albuterol (PROVENTIL HFA;[name] 108 (90 BASE) MCG/ACT inhaler Inhale 2 puffs into the lungs every 6 (six) hours as needed for wheezing or shortness of breath.    Historical Provider, MD  FLUoxetine (PROZAC) 40 MG capsule Take 1 capsule (40 mg total) by mouth daily. Patient not taking: Reported on 08/08/2014 03/17/14   Abram SanderElena M Adamo, MD   Triage Vitals: BP 113/56 mmHg  Pulse 75  Temp(Src) 98.9 F (37.2 C) (Oral)  Resp 18  Ht 5\' 6"  (1.676 m)  Wt 160 lb (72.576 kg)  BMI 25.84 kg/m2  SpO2 100%  LMP 06/26/2014 (LMP Unknown) Physical Exam  Constitutional: She is oriented to person, place, and time. She appears well-developed and well-nourished.  HENT:  Head: Normocephalic and atraumatic.  Eyes:  EOM are normal.  Neck: Normal range of motion.  Cardiovascular: Normal rate, regular rhythm and normal heart sounds.  Exam reveals no gallop and no friction rub.   No murmur heard. Pulses:      Dorsalis pedis pulses are 2+ on the right side, and 2+ on the left side.       Posterior tibial pulses are 2+ on the right side, and 2+ on the left side.  Cap refill less than 2 seconds.  Pulmonary/Chest: Effort normal and breath sounds normal. No respiratory distress. She has no wheezes. She has no rales.  Abdominal: Bowel sounds are normal.  Musculoskeletal: Normal range of motion.  No lesions between toes. Multiple areas of callous formation but no puncture wounds of plantar surface. Area of erythema  covering 2/3 of dorsum of foot. Area is not hot. No red streaking. No pretibial edema. Negative Holman's sign. 2 oval red areas with raised center at left inner thigh. 1st one is 4 x 5 cm. 2nd is 3 x 3 cm. No red streaks.  Lymphadenopathy:       Right: No inguinal adenopathy present.       Left: No inguinal adenopathy present.  Neurological: She is alert and oriented to person, place, and time.  Skin: Skin is warm and dry.  Psychiatric: She has a normal mood and affect. Her behavior is normal.  Nursing note and vitals reviewed.   ED Course  Procedures (including critical care time) DIAGNOSTIC STUDIES: Oxygen Saturation is 100% on RA, normal by my interpretation.   COORDINATION OF CARE: 2:19 PM- Informed pt there is no evidence of infection. Advised to try Benadryl Cream if not responding to Hydrocortisone cream. Return precautions discussed. Pt verbalizes understanding and agrees to plan.  Medications - No data to display  Labs Review Labs Reviewed - No data to display  Imaging Review No results found.   EKG Interpretation None      MDM  Patient is an area of several bites on the dorsum of the foot surrounded by some increased redness. There is no increase warmth or pain. There no red streaks appreciated. There is a second area of the left upper thigh with oval areas with raised spot the middle. There is no drainage appreciated.  No temperature or pulse elevations. There is no evidence for any anaphylaxis. No evidence for any advancing infection. Suspect the patient has local reaction to insect bite. Patient is advised to use Benadryl cream 3 times daily, and to use Benadryl orally if needed for itching. I have also asked the patient to discuss this with her GYN specialist as she is pregnant at this time.    Final diagnoses:  Multiple insect bites  Pregnancy    *I have reviewed nursing notes, vital signs, and all appropriate lab and imaging results for this patient.**  I  personally performed the services described in this documentation, which was scribed in my presence. The recorded information has been reviewed and is accurate.    Kathie DikeHobson M Emryn Flanery, PA-C 08/27/14 1440  Hilario Quarryanielle S Ray, MD 08/27/14 [phone]

## 2014-08-27 NOTE — Discharge Instructions (Signed)
Please do not scratch the insect bite areas. Benadryl cream 3 times daily along with oral Benadryl may be helpful in with the itching. Please see your GYN specialist for other medications that may be safe for you and your baby.

## 2014-08-28 MED ORDER — POLYETHYLENE GLYCOL 3350 17 GM/SCOOP PO POWD: 17.0000 g | Freq: Two times a day (BID) | ORAL | Status: DC | PRN

## 2014-08-28 MED ORDER — ONDANSETRON 4 MG PO TBDP: 4.0000 mg | ORAL_TABLET | Freq: Three times a day (TID) | ORAL | Status: DC | PRN

## 2014-08-28 NOTE — Telephone Encounter (Signed)
Pt mom informed. Blount, [place] [name] 

## 2014-08-28 NOTE — Telephone Encounter (Signed)
Please inform patient that I have sent both zofran for nausea and miralax for constipation to her pharmacy. Please remind her that the zofran will make her more constipated and that if the miralax is not working she can increase the dose as it is a very safe medicine.

## 2014-09-04 ENCOUNTER — Other Ambulatory Visit: Payer: Medicaid Other

## 2014-09-04 DIAGNOSIS — Z3481 Encounter for supervision of other normal pregnancy, first trimester: Secondary | ICD-10-CM

## 2014-09-04 NOTE — Progress Notes (Signed)
[facility]ren[facility] and [facility],  OB urine culture sent sickle cell = negative on 05-03-14

## 2014-09-04 NOTE — Progress Notes (Signed)
NEW OB LABS DONE TODAY Marissa Contreras 

## 2014-09-05 LAB — OBSTETRIC PANEL
Antibody Screen: NEGATIVE
Basophils Absolute: 0 10*3/uL (ref 0.0–0.1)
Basophils Relative: 0 % (ref 0–1)
EOS ABS: 0 10*3/uL (ref 0.0–0.7)
Eosinophils Relative: 0 % (ref 0–5)
HCT: 35.3 % — ABNORMAL LOW (ref 36.0–46.0)
HEMOGLOBIN: 11.6 g/dL — AB (ref 12.0–15.0)
Hepatitis B Surface Ag: NEGATIVE
LYMPHS ABS: 1.8 10*3/uL (ref 0.7–4.0)
Lymphocytes Relative: 22 % (ref 12–46)
MCH: 28 pg (ref 26.0–34.0)
MCHC: 32.9 g/dL (ref 30.0–36.0)
MCV: 85.1 fL (ref 78.0–100.0)
MONOS PCT: 8 % (ref 3–12)
MPV: 10.6 fL (ref 9.4–12.4)
Monocytes Absolute: 0.7 10*3/uL (ref 0.1–1.0)
Neutro Abs: 5.9 10*3/uL (ref 1.7–7.7)
Neutrophils Relative %: 70 % (ref 43–77)
Platelets: 264 10*3/uL (ref 150–400)
RBC: 4.15 MIL/uL (ref 3.87–5.11)
RDW: 14.1 % (ref 11.5–15.5)
RH TYPE: POSITIVE
Rubella: 1.73 Index — ABNORMAL HIGH (ref <0.90)
WBC: 8.4 10*3/uL (ref 4.0–10.5)

## 2014-09-05 LAB — HIV ANTIBODY (ROUTINE TESTING W REFLEX): HIV 1&2 Ab, 4th Generation: NONREACTIVE

## 2014-09-06 LAB — CULTURE, OB URINE

## 2014-09-11 ENCOUNTER — Other Ambulatory Visit (HOSPITAL_COMMUNITY)
Admission: RE | Admit: 2014-09-11 | Discharge: 2014-09-11 | Disposition: A | Discharge status: Home / Self Care | Payer: Medicaid Other | Source: Ambulatory Visit | Attending: Family Medicine | Admitting: Family Medicine

## 2014-09-11 ENCOUNTER — Ambulatory Visit (INDEPENDENT_AMBULATORY_CARE_PROVIDER_SITE_OTHER): Payer: Medicaid Other | Admitting: Family Medicine

## 2014-09-11 ENCOUNTER — Encounter: Payer: Self-pay | Admitting: Family Medicine

## 2014-09-11 VITALS — BP 121/78 | HR 108 | Temp 98.6°F | Wt 160.0 lb

## 2014-09-11 DIAGNOSIS — Z01419 Encounter for gynecological examination (general) (routine) without abnormal findings: Secondary | ICD-10-CM | POA: Insufficient documentation

## 2014-09-11 DIAGNOSIS — Z113 Encounter for screening for infections with a predominantly sexual mode of transmission: Secondary | ICD-10-CM | POA: Diagnosis present

## 2014-09-11 DIAGNOSIS — Z3481 Encounter for supervision of other normal pregnancy, first trimester: Secondary | ICD-10-CM

## 2014-09-11 DIAGNOSIS — Z3491 Encounter for supervision of normal pregnancy, unspecified, first trimester: Secondary | ICD-10-CM

## 2014-09-11 DIAGNOSIS — Z1151 Encounter for screening for human papillomavirus (HPV): Secondary | ICD-10-CM | POA: Diagnosis present

## 2014-09-11 MED ORDER — PRENATAL MULTIVITAMIN CH: 1.0000 | ORAL_TABLET | Freq: Every day | ORAL | Status: DC

## 2014-09-11 NOTE — Patient Instructions (Signed)
First Trimester of Pregnancy The first trimester of pregnancy is from week 1 until the end of week 12 (months 1 through 3). A week after a sperm fertilizes an egg, the egg will implant on the wall of the uterus. This embryo will begin to develop into a baby. Genes from you and your partner are forming the baby. The female genes determine whether the baby is a boy or a girl. At 6-8 weeks, the eyes and face are formed, and the heartbeat can be seen on ultrasound. At the end of 12 weeks, all the baby's organs are formed.  Now that you are pregnant, you will want to do everything you can to have a healthy baby. Two of the most important things are to get good prenatal care and to follow your health care provider's instructions. Prenatal care is all the medical care you receive before the baby's birth. This care will help prevent, find, and treat any problems during the pregnancy and childbirth. BODY CHANGES Your body goes through many changes during pregnancy. The changes vary from woman to woman.   You may gain or lose a couple of pounds at first.  You may feel sick to your stomach (nauseous) and throw up (vomit). If the vomiting is uncontrollable, call your health care provider.  You may tire easily.  You may develop headaches that can be relieved by medicines approved by your health care provider.  You may urinate more often. Painful urination may mean you have a bladder infection.  You may develop heartburn as a result of your pregnancy.  You may develop constipation because certain hormones are causing the muscles that push waste through your intestines to slow down.  You may develop hemorrhoids or swollen, bulging veins (varicose veins).  Your breasts may begin to grow larger and become tender. Your nipples may stick out more, and the tissue that surrounds them (areola) may become darker.  Your gums may bleed and may be sensitive to brushing and flossing.  Dark spots or blotches (chloasma,  mask of pregnancy) may develop on your face. This will likely fade after the baby is born.  Your menstrual periods will stop.  You may have a loss of appetite.  You may develop cravings for certain kinds of food.  You may have changes in your emotions from day to day, such as being excited to be pregnant or being concerned that something may go wrong with the pregnancy and baby.  You may have more vivid and strange dreams.  You may have changes in your hair. These can include thickening of your hair, rapid growth, and changes in texture. Some women also have hair loss during or after pregnancy, or hair that feels dry or thin. Your hair will most likely return to normal after your baby is born. WHAT TO EXPECT AT YOUR PRENATAL VISITS During a routine prenatal visit:  You will be weighed to make sure you and the baby are growing normally.  Your blood pressure will be taken.  Your abdomen will be measured to track your baby's growth.  The fetal heartbeat will be listened to starting around week 10 or 12 of your pregnancy.  Test results from any previous visits will be discussed. Your health care provider may ask you:  How you are feeling.  If you are feeling the baby move.  If you have had any abnormal symptoms, such as leaking fluid, bleeding, severe headaches, or abdominal cramping.  If you have any questions. Other tests   that may be performed during your first trimester include:  Blood tests to find your blood type and to check for the presence of any previous infections. They will also be used to check for low iron levels (anemia) and Rh antibodies. Later in the pregnancy, blood tests for diabetes will be done along with other tests if problems develop.  Urine tests to check for infections, diabetes, or protein in the urine.  An ultrasound to confirm the proper growth and development of the baby.  An amniocentesis to check for possible genetic problems.  Fetal screens for  spina bifida and Down syndrome.  You may need other tests to make sure you and the baby are doing well. HOME CARE INSTRUCTIONS  Medicines  Follow your health care provider's instructions regarding medicine use. Specific medicines may be either safe or unsafe to take during pregnancy.  Take your prenatal vitamins as directed.  If you develop constipation, try taking a stool softener if your health care provider approves. Diet  Eat regular, well-balanced meals. Choose a variety of foods, such as meat or vegetable-based protein, fish, milk and low-fat dairy products, vegetables, fruits, and whole grain breads and cereals. Your health care provider will help you determine the amount of weight gain that is right for you.  Avoid raw meat and uncooked cheese. These carry germs that can cause birth defects in the baby.  Eating four or five small meals rather than three large meals a day may help relieve nausea and vomiting. If you start to feel nauseous, eating a few soda crackers can be helpful. Drinking liquids between meals instead of during meals also seems to help nausea and vomiting.  If you develop constipation, eat more high-fiber foods, such as fresh vegetables or fruit and whole grains. Drink enough fluids to keep your urine clear or pale yellow. Activity and Exercise  Exercise only as directed by your health care provider. Exercising will help you:  Control your weight.  Stay in shape.  Be prepared for labor and delivery.  Experiencing pain or cramping in the lower abdomen or low back is a good sign that you should stop exercising. Check with your health care provider before continuing normal exercises.  Try to avoid standing for long periods of time. Move your legs often if you must stand in one place for a long time.  Avoid heavy lifting.  Wear low-heeled shoes, and practice good posture.  You may continue to have sex unless your health care provider directs you  otherwise. Relief of Pain or Discomfort  Wear a good support bra for breast tenderness.   Take warm sitz baths to soothe any pain or discomfort caused by hemorrhoids. Use hemorrhoid cream if your health care provider approves.   Rest with your legs elevated if you have leg cramps or low back pain.  If you develop varicose veins in your legs, wear support hose. Elevate your feet for 15 minutes, 3-4 times a day. Limit salt in your diet. Prenatal Care  Schedule your prenatal visits by the twelfth week of pregnancy. They are usually scheduled monthly at first, then more often in the last 2 months before delivery.  Write down your questions. Take them to your prenatal visits.  Keep all your prenatal visits as directed by your health care provider. Safety  Wear your seat belt at all times when driving.  Make a list of emergency phone numbers, including numbers for family, friends, the hospital, and police and fire departments. General Tips    Ask your health care provider for a referral to a local prenatal education class. Begin classes no later than at the beginning of month 6 of your pregnancy.  Ask for help if you have counseling or nutritional needs during pregnancy. Your health care provider can offer advice or refer you to specialists for help with various needs.  Do not use hot tubs, steam rooms, or saunas.  Do not douche or use tampons or scented sanitary pads.  Do not cross your legs for long periods of time.  Avoid cat litter boxes and soil used by cats. These carry germs that can cause birth defects in the baby and possibly loss of the fetus by miscarriage or stillbirth.  Avoid all smoking, herbs, alcohol, and medicines not prescribed by your health care provider. Chemicals in these affect the formation and growth of the baby.  Schedule a dentist appointment. At home, brush your teeth with a soft toothbrush and be gentle when you floss. SEEK MEDICAL CARE IF:   You have  dizziness.  You have mild pelvic cramps, pelvic pressure, or nagging pain in the abdominal area.  You have persistent nausea, vomiting, or diarrhea.  You have a bad smelling vaginal discharge.  You have pain with urination.  You notice increased swelling in your face, hands, legs, or ankles. SEEK IMMEDIATE MEDICAL CARE IF:   You have a fever.  You are leaking fluid from your vagina.  You have spotting or bleeding from your vagina.  You have severe abdominal cramping or pain.  You have rapid weight gain or loss.  You vomit blood or material that looks like coffee grounds.  You are exposed to German measles and have never had them.  You are exposed to fifth disease or chickenpox.  You develop a severe headache.  You have shortness of breath.  You have any kind of trauma, such as from a fall or a car accident. Document Released: 09/02/2001 Document Revised: 01/23/2014 Document Reviewed: 07/19/2013 Exit[employer] Patient Information 2015 [employer]. This information is not intended to replace advice given to you by your health care provider. Make sure you discuss any questions you have with your health care provider.  

## 2014-09-11 NOTE — Progress Notes (Signed)
Marissa Contreras is a 23 y.o. yo [phone] at 4862w4d by uncertain LMP who presents for her initial prenatal visit. Pregnancy  isplanned She reports fatigue, morning sickness, nausea and positive home pregnancy test. She  is notTaking PNV. See flow sheet for details.  PMH, POBH, FH, meds, allergies and Social Hx reviewed.  Prenatal exam: Gen: Well nourished, well developed.  No distress.  Vitals noted. HEENT: Normocephalic, atraumatic.  Neck supple without cervical lymphadenopathy, thyromegaly or thyroid nodules.  fair dentition. CV: RRR no murmur, gallops or rubs Lungs: CTA B.  Normal respiratory effort without wheezes or rales. Abd: soft, NTND. +BS.  Uterus not appreciated above pelvis. GU: Normal external female genitalia without lesions.  Nl vaginal, well rugated without lesions. No vaginal discharge.  Bimanual exam: No adnexal mass or TTP. No CMT.  Uterus size ~9wks Ext: No clubbing, cyanosis or edema. Psych: Normal grooming and dress.  Not depressed or anxious appearing.  Normal thought content and process without flight of ideas or looseness of associations    Assessment/plan: 1) Pregnancy 7162w4d doing well.  Recurrent early losses with normal karyotype on fetus and both parents. Previous term delivery without complications. [facility] if [facility] continues and infertility eval if not. Current pregnancy issues include nausea/vomiting, constipation Dating is not reliable, dating ultrasound scheduled next week Prenatal labs reviewed, notable for all normal. Bleeding and pain precautions reviewed. Importance of prenatal vitamins reviewed, script printed  Genetic screening offered.  Early glucola is not indicated.    Follow up 4 weeks.

## 2014-09-12 LAB — CYTOLOGY - PAP

## 2014-09-19 ENCOUNTER — Ambulatory Visit (HOSPITAL_COMMUNITY)
Admission: RE | Admit: 2014-09-19 | Discharge: 2014-09-19 | Disposition: A | Discharge status: Home / Self Care | Payer: Medicaid Other | Source: Ambulatory Visit | Attending: Family Medicine | Admitting: Family Medicine

## 2014-09-19 ENCOUNTER — Encounter: Payer: Self-pay | Admitting: Family Medicine

## 2014-09-19 ENCOUNTER — Telehealth: Payer: Self-pay | Admitting: *Deleted

## 2014-09-19 DIAGNOSIS — Z36 Encounter for antenatal screening of mother: Secondary | ICD-10-CM | POA: Insufficient documentation

## 2014-09-19 DIAGNOSIS — Z3A10 10 weeks gestation of pregnancy: Secondary | ICD-10-CM | POA: Diagnosis present

## 2014-09-19 DIAGNOSIS — Z3491 Encounter for supervision of normal pregnancy, unspecified, first trimester: Secondary | ICD-10-CM

## 2014-09-19 NOTE — Telephone Encounter (Signed)
Left message on voicemail for patient to call back. 

## 2014-09-19 NOTE — Telephone Encounter (Signed)
-----   Message from Abram SanderElena M Adamo, MD sent at 09/19/2014  2:30 PM EST ----- Please let Marissa Contreras know that her pap smear was normal and she will need her next in 3 years. Please also tell her that the ultrasound looks good and is consistent with a last period on October 15th so that makes July 21st  the official due date which makes her 10 weeks and 5 days today.  Thanks!

## 2014-09-22 NOTE — L&D Delivery Note (Cosign Needed)
Delivery Note Shortly after AROM, pt was noted to be fully dilated at +3 station. After a 2 push 2nd stage, at 6:05 AM a viable female was delivered via  (Presentation: LOA;  ).  APGAR: 9/9, ; weight  .  8# 9 oz.  After 3 m inutes, the cord was clamped and cut. 40 units of pitocin diluted in 1000cc LR was infused rapidly IV.  The placenta separated spontaneously and delivered via CCT and maternal pushing effort.  It was inspected and appears to be intact with a 3 VC.   Anesthesia:  epidural Episiotomy:  none Lacerations:  none Suture Repair: n/a Est. Blood Loss (mL):  300  Mom to postpartum.  Baby to Couplet care / Skin to Skin   Paulina Fusi, SNM, managed 3rd stage labor.  Marissa Contreras,Marissa Contreras 04/13/2015, 6:36 AM

## 2014-09-27 ENCOUNTER — Encounter: Payer: Self-pay | Admitting: Family Medicine

## 2014-09-27 ENCOUNTER — Ambulatory Visit (INDEPENDENT_AMBULATORY_CARE_PROVIDER_SITE_OTHER): Payer: Medicaid Other | Admitting: Family Medicine

## 2014-09-27 VITALS — BP 134/75 | HR 84 | Ht 66.0 in | Wt 159.0 lb

## 2014-09-27 DIAGNOSIS — L298 Other pruritus: Secondary | ICD-10-CM

## 2014-09-27 DIAGNOSIS — N898 Other specified noninflammatory disorders of vagina: Secondary | ICD-10-CM

## 2014-09-27 LAB — POCT WET PREP (WET MOUNT): Clue Cells Wet Prep Whiff POC: NEGATIVE

## 2014-09-27 MED ORDER — FLUCONAZOLE 150 MG PO TABS: 150.0000 mg | ORAL_TABLET | Freq: Once | ORAL | Status: DC

## 2014-09-27 MED ORDER — CLOTRIMAZOLE 1 % VA CREA: 1.0000 | TOPICAL_CREAM | Freq: Every day | VAGINAL | Status: DC

## 2014-09-27 MED ORDER — METRONIDAZOLE 500 MG PO TABS: 500.0000 mg | ORAL_TABLET | Freq: Two times a day (BID) | ORAL | Status: DC

## 2014-09-27 NOTE — Assessment & Plan Note (Signed)
Occasional yeast on wet prep but history and PE more consistent with BV - will treat with clotrimazole for yeast infection - rx for flagyl to take if not resolving with yeast treatment

## 2014-09-27 NOTE — Progress Notes (Signed)
   Subjective:    Patient ID: Marissa Contreras, female    DOB: 03/07/1991, 24 y.o.   MRN: [id number]  HPI Pt presents with vaginal discharge, irritation and burning with urination. She reports she has had BV before and this feels similarly. She is 11w pregnant and reports she gets frequent infections in pregnancy. The discharge is thin and green. The itching is mild and mostly feels like irritation. She has dysuria but feels the burning on the outside and has no frequency or urgency.   Review of Systems No fever, bleeding, LOF, cramping or abdominal pain.    Objective:   Physical Exam  Constitutional: She is oriented to person, place, and time. She appears well-developed and well-nourished. No distress.  HENT:  Head: Normocephalic and atraumatic.  Eyes: Conjunctivae are normal. Right eye exhibits no discharge. Left eye exhibits no discharge. No scleral icterus.  Cardiovascular: Normal rate.   Pulmonary/Chest: Effort normal. No respiratory distress.  Abdominal: She exhibits no distension.  Genitourinary: Uterus normal. No labial fusion. There is no rash, tenderness, lesion or injury on the right labia. There is no rash, tenderness, lesion or injury on the left labia. Cervix exhibits no motion tenderness, no discharge and no friability. There is tenderness in the vagina. No erythema or bleeding in the vagina. No foreign body around the vagina. No signs of injury around the vagina. Vaginal discharge found.  Moderate amount of thin greenish discharge  Neurological: She is alert and oriented to person, place, and time.  Skin: Skin is warm and dry. She is not diaphoretic.  Psychiatric: She has a normal mood and affect. Her behavior is normal.  Nursing note and vitals reviewed.         Assessment & Plan:

## 2014-09-27 NOTE — Patient Instructions (Signed)
Bacterial Vaginosis Bacterial vaginosis is a vaginal infection that occurs when the normal balance of bacteria in the vagina is disrupted. It results from an overgrowth of certain bacteria. This is the most common vaginal infection in women of childbearing age. Treatment is important to prevent complications, especially in pregnant women, as it can cause a premature delivery. CAUSES  Bacterial vaginosis is caused by an increase in harmful bacteria that are normally present in smaller amounts in the vagina. Several different kinds of bacteria can cause bacterial vaginosis. However, the reason that the condition develops is not fully understood. RISK FACTORS Certain activities or behaviors can put you at an increased risk of developing bacterial vaginosis, including:  Having a new sex partner or multiple sex partners.  Douching.  Using an intrauterine device (IUD) for contraception. Women do not get bacterial vaginosis from toilet seats, bedding, swimming pools, or contact with objects around them. SIGNS AND SYMPTOMS  Some women with bacterial vaginosis have no signs or symptoms. Common symptoms include:  Grey vaginal discharge.  A fishlike odor with discharge, especially after sexual intercourse.  Itching or burning of the vagina and vulva.  Burning or pain with urination. DIAGNOSIS  Your health care provider will take a medical history and examine the vagina for signs of bacterial vaginosis. A sample of vaginal fluid may be taken. Your health care provider will look at this sample under a microscope to check for bacteria and abnormal cells. A vaginal pH test may also be done.  TREATMENT  Bacterial vaginosis may be treated with antibiotic medicines. These may be given in the form of a pill or a vaginal cream. A second round of antibiotics may be prescribed if the condition comes back after treatment.  HOME CARE INSTRUCTIONS   Only take over-the-counter or prescription medicines as  directed by your health care provider.  If antibiotic medicine was prescribed, take it as directed. Make sure you finish it even if you start to feel better.  Do not have sex until treatment is completed.  Tell all sexual partners that you have a vaginal infection. They should see their health care provider and be treated if they have problems, such as a mild rash or itching.  Practice safe sex by using condoms and only having one sex partner. SEEK MEDICAL CARE IF:   Your symptoms are not improving after 3 days of treatment.  You have increased discharge or pain.  You have a fever. MAKE SURE YOU:   Understand these instructions.  Will watch your condition.  Will get help right away if you are not doing well or get worse. FOR MORE INFORMATION  Centers for Disease Control and Prevention, Division of STD Prevention: www.cdc.gov/std American Sexual Health Association (ASHA): www.ashastd.org  Document Released: 09/08/2005 Document Revised: 06/29/2013 Document Reviewed: 04/20/2013 ExitCare Patient Information 2015 [employer]. This information is not intended to replace advice given to you by your health care provider. Make sure you discuss any questions you have with your health care provider.  

## 2014-10-10 ENCOUNTER — Other Ambulatory Visit: Payer: Self-pay | Admitting: Family Medicine

## 2014-10-23 ENCOUNTER — Ambulatory Visit (INDEPENDENT_AMBULATORY_CARE_PROVIDER_SITE_OTHER): Payer: Medicaid Other | Admitting: Family Medicine

## 2014-10-23 VITALS — BP 126/79 | HR 116 | Temp 99.0°F | Wt 156.0 lb

## 2014-10-23 DIAGNOSIS — Z3492 Encounter for supervision of normal pregnancy, unspecified, second trimester: Secondary | ICD-10-CM

## 2014-10-23 DIAGNOSIS — Z3481 Encounter for supervision of other normal pregnancy, first trimester: Secondary | ICD-10-CM

## 2014-10-23 NOTE — Progress Notes (Signed)
Marissa Contreras is a 24 y.o. [phone] at 8531w4d by L/10 presents for ROB  Discussed with Patient:  - Reviewed genetics screen (Quad screen ordered, will return for lab appt within 1 week).   - Patient plans on breast feeding. - Routine precautions discussed (depression, infection s/s).   Patient provided with all pertinent phone numbers for emergencies. - RTC for any VB, regular, painful cramps/ctxs occurring at a rate of >2/10 min, fever (100.5 or higher), n/v/d, any pain that is unresolving or worsening. - RTC in 4 weeks for next appt. - Schedule anatomy scan in 3-4 weeks  Problems: Patient Active Problem List   Diagnosis Date Noted  . Vaginal itching 09/27/2014  . Supervision of other normal pregnancy 01/11/2014  . Depression 12/20/2013  . H/O varicella   . Back pain 02/11/2012    To Do: 1. 20 week anatomy scan ordered.  [name] to schedule and call patient in am  [ ]  Vaccines: Flu: declined  Tdap: will do at 28 weeks [ ]  BCM: undecided  Edu: [ X] PTL precautions; [declined ] BF class; [declined ] childbirth class; [ X]  BF counseling

## 2014-10-23 NOTE — Patient Instructions (Signed)
Please schedule a follow up for you in 4 weeks and try to schedule Marissa Contreras's well child check the same day.

## 2014-10-31 ENCOUNTER — Other Ambulatory Visit: Payer: Medicaid Other

## 2014-10-31 DIAGNOSIS — Z3481 Encounter for supervision of other normal pregnancy, first trimester: Secondary | ICD-10-CM

## 2014-10-31 NOTE — Progress Notes (Signed)
[facility]

## 2014-11-01 ENCOUNTER — Telehealth: Payer: Self-pay | Admitting: Family Medicine

## 2014-11-01 NOTE — Telephone Encounter (Signed)
Tried calling mother back at number list; invalid number.  Called home number listed for pt; wrong number and mobile number unable to leave voice mail (voicemail was not set up).  Clovis PuMartin, Jondavid Schreier L, RN

## 2014-11-01 NOTE — Telephone Encounter (Signed)
Mother called and said that her daughter is sick again. She would like to talk to a nurse about this. She also has a sinus infection and she wanted to know what she can take. jw

## 2014-11-14 ENCOUNTER — Ambulatory Visit (HOSPITAL_COMMUNITY)
Admission: RE | Admit: 2014-11-14 | Discharge: 2014-11-14 | Disposition: A | Discharge status: Home / Self Care | Payer: Medicaid Other | Source: Ambulatory Visit | Attending: Family Medicine | Admitting: Family Medicine

## 2014-11-14 DIAGNOSIS — Z3492 Encounter for supervision of normal pregnancy, unspecified, second trimester: Secondary | ICD-10-CM | POA: Diagnosis not present

## 2014-11-14 DIAGNOSIS — Z3689 Encounter for other specified antenatal screening: Secondary | ICD-10-CM | POA: Insufficient documentation

## 2014-11-14 DIAGNOSIS — Z3A18 18 weeks gestation of pregnancy: Secondary | ICD-10-CM | POA: Insufficient documentation

## 2014-11-24 ENCOUNTER — Other Ambulatory Visit: Payer: Self-pay | Admitting: Family Medicine

## 2014-11-24 DIAGNOSIS — IMO0002 Reserved for concepts with insufficient information to code with codable children: Secondary | ICD-10-CM

## 2014-11-24 DIAGNOSIS — Z0489 Encounter for examination and observation for other specified reasons: Secondary | ICD-10-CM

## 2014-11-30 ENCOUNTER — Telehealth: Payer: Self-pay | Admitting: *Deleted

## 2014-11-30 NOTE — Telephone Encounter (Signed)
-----   Message from Abram SanderElena M Adamo, MD sent at 11/24/2014  9:23 AM EST ----- Please schedule her f/u ultrasound between 3/22 and 4/5 and let her and/or her mom know when it is. The first ultrasound was normal but there were several things they could not see well enough.

## 2014-11-30 NOTE — Telephone Encounter (Signed)
Left message on patient mother voicemail with appointment details ([facility] Asked that she call back with any questions.

## 2014-12-01 ENCOUNTER — Telehealth: Payer: Self-pay | Admitting: Family Medicine

## 2014-12-01 NOTE — Telephone Encounter (Signed)
Pt scheduled for follow up appt. Marissa Contreras, [name]

## 2014-12-01 NOTE — Telephone Encounter (Signed)
Please try to contact patient or her mother to attempt to schedule her for overdue OB follow-up. If she can do next Tuesday please put her in my open sameday spot and if not with another red team provider asap.

## 2014-12-05 ENCOUNTER — Ambulatory Visit (INDEPENDENT_AMBULATORY_CARE_PROVIDER_SITE_OTHER): Payer: Medicaid Other | Admitting: Family Medicine

## 2014-12-05 ENCOUNTER — Encounter: Payer: Self-pay | Admitting: Family Medicine

## 2014-12-05 VITALS — BP 128/79 | HR 114 | Temp 98.0°F | Wt 167.0 lb

## 2014-12-05 DIAGNOSIS — M25551 Pain in right hip: Secondary | ICD-10-CM

## 2014-12-05 DIAGNOSIS — Z3482 Encounter for supervision of other normal pregnancy, second trimester: Secondary | ICD-10-CM

## 2014-12-05 DIAGNOSIS — M25559 Pain in unspecified hip: Secondary | ICD-10-CM

## 2014-12-05 DIAGNOSIS — M25552 Pain in left hip: Secondary | ICD-10-CM

## 2014-12-05 MED ORDER — LIDOCAINE 5 % EX PTCH: 1.0000 | MEDICATED_PATCH | CUTANEOUS | Status: DC

## 2014-12-05 MED ORDER — CYCLOBENZAPRINE HCL 5 MG PO TABS: 5.0000 mg | ORAL_TABLET | Freq: Three times a day (TID) | ORAL | Status: DC | PRN

## 2014-12-05 NOTE — Patient Instructions (Signed)
Prenatal Care  WHAT IS PRENATAL CARE?  Prenatal care means health care during your pregnancy, before your baby is born. It is very important to take care of yourself and your baby during your pregnancy by:   Getting early prenatal care. If you know you are pregnant, or think you might be pregnant, call your health care provider as soon as possible. Schedule a visit for a prenatal exam.  Getting regular prenatal care. Follow your health care provider's schedule for blood and other necessary tests. Do not miss appointments.  Doing everything you can to keep yourself and your baby healthy during your pregnancy.  Getting complete care. Prenatal care should include evaluation of the medical, dietary, educational, psychological, and social needs of you and your significant other. The medical and genetic history of your family and the family of your baby's father should be discussed with your health care provider.  Discussing with your health care provider:  Prescription, over-the-counter, and herbal medicines that you take.  Any history of substance abuse, alcohol use, smoking, and illegal drug use.  Any history of domestic abuse and violence.  Immunizations you have received.  Your nutrition and diet.  The amount of exercise you do.  Any environmental and occupational hazards to which you are exposed.  History of sexually transmitted infections for both you and your partner.  Previous pregnancies you have had. WHY IS PRENATAL CARE SO IMPORTANT?  By regularly seeing your health care provider, you help ensure that problems can be identified early so that they can be treated as soon as possible. Other problems might be prevented. Many studies have shown that early and regular prenatal care is important for the health of mothers and their babies.  HOW CAN I TAKE CARE OF MYSELF WHILE I AM PREGNANT?  Here are ways to take care of yourself and your baby:   Start or continue taking your  multivitamin with 400 micrograms (mcg) of folic acid every day.  Get early and regular prenatal care. It is very important to see a health care provider during your pregnancy. Your health care provider will check at each visit to make sure that you and your baby are healthy. If there are any problems, action can be taken right away to help you and your baby.  Eat a healthy diet that includes:  Fruits.  Vegetables.  Foods low in saturated fat.  Whole grains.  Calcium-rich foods, such as milk, yogurt, and hard cheeses.  Drink 6-8 glasses of liquids a day.  Unless your health care provider tells you not to, try to be physically active for 30 minutes, most days of the week. If you are pressed for time, you can get your activity in through 10-minute segments, three times a day.  Do not smoke, drink alcohol, or use drugs. These can cause long-term damage to your baby. Talk with your health care provider about steps to take to stop smoking. Talk with a member of your faith community, a counselor, a trusted friend, or your health care provider if you are concerned about your alcohol or drug use.  Ask your health care provider before taking any medicine, even over-the-counter medicines. Some medicines are not safe to take during pregnancy.  Get plenty of rest and sleep.  Avoid hot tubs and saunas during pregnancy.  Do not have X-rays taken unless absolutely necessary and with the recommendation of your health care provider. A lead shield can be placed on your abdomen to protect your baby when   X-rays are taken in other parts of your body.  Do not empty the cat litter when you are pregnant. It may contain a parasite that causes an infection called toxoplasmosis, which can cause birth defects. Also, use gloves when working in garden areas used by cats.  Do not eat uncooked or undercooked meats or fish.  Do not eat soft, mold-ripened cheeses (Brie, Camembert, and chevre) or soft, blue-veined  cheese (Danish blue and Roquefort).  Stay away from toxic chemicals like:  Insecticides.  Solvents (some cleaners or paint thinners).  Lead.  Mercury.  Sexual intercourse may continue until the end of the pregnancy, unless you have a medical problem or there is a problem with the pregnancy and your health care provider tells you not to.  Do not wear high-heel shoes, especially during the second half of the pregnancy. You can lose your balance and fall.  Do not take long trips, unless absolutely necessary. Be sure to see your health care provider before going on the trip.  Do not sit in one position for more than 2 hours when on a trip.  Take a copy of your medical records when going on a trip. Know where a hospital is located in the city you are visiting, in case of an emergency.  Most dangerous household products will have pregnancy warnings on their labels. Ask your health care provider about products if you are unsure.  Limit or eliminate your caffeine intake from coffee, tea, sodas, medicines, and chocolate.  Many women continue working through pregnancy. Staying active might help you stay healthier. If you have a question about the safety or the hours you work at your particular job, talk with your health care provider.  Get informed:  Read books.  Watch videos.  Go to childbirth classes for you and your significant other.  Talk with experienced moms.  Ask your health care provider about childbirth education classes for you and your partner. Classes can help you and your partner prepare for the birth of your baby.  Ask about a baby doctor (pediatrician) and methods and pain medicine for labor, delivery, and possible cesarean delivery. HOW OFTEN SHOULD I SEE MY HEALTH CARE PROVIDER DURING PREGNANCY?  Your health care provider will give you a schedule for your prenatal visits. You will have visits more often as you get closer to the end of your pregnancy. An average  pregnancy lasts about 40 weeks.  A typical schedule includes visiting your health care provider:   About once each month during your first 6 months of pregnancy.  Every 2 weeks during the next 2 months.  Weekly in the last month, until the delivery date. Your health care provider will probably want to see you more often if:  You are older than 35 years.  Your pregnancy is high risk because you have certain health problems or problems with the pregnancy, such as:  Diabetes.  High blood pressure.  The baby is not growing on schedule, according to the dates of the pregnancy. Your health care provider will do special tests to make sure you and your baby are not having any serious problems. WHAT HAPPENS DURING PRENATAL VISITS?   At your first prenatal visit, your health care provider will do a physical exam and talk to you about your health history and the health history of your partner and your family. Your health care provider will be able to tell you what date to expect your baby to be born on.  Your   first physical exam will include checks of your blood pressure, measurements of your height and weight, and an exam of your pelvic organs. Your health care provider will do a Pap test if you have not had one recently and will do cultures of your cervix to make sure there is no infection.  At each prenatal visit, there will be tests of your blood, urine, blood pressure, weight, and the progress of the baby will be checked.  At your later prenatal visits, your health care provider will check how you are doing and how your baby is developing. You may have a number of tests done as your pregnancy progresses.  Ultrasound exams are often used to check on your baby's growth and health.  You may have more urine and blood tests, as well as special tests, if needed. These may include amniocentesis to examine fluid in the pregnancy sac, stress tests to check how the baby responds to contractions, or a  biophysical profile to measure your baby's well-being. Your health care provider will explain the tests and why they are necessary.  You should be tested for high blood sugar (gestational diabetes) between the 24th and 28th weeks of your pregnancy.  You should discuss with your health care provider your plans to breastfeed or bottle-feed your baby.  Each visit is also a chance for you to learn about staying healthy during pregnancy and to ask questions. Document Released: 09/11/2003 Document Revised: 09/13/2013 Document Reviewed: 11/23/2013 ExitCare Patient Information 2015 [employer]. This information is not intended to replace advice given to you by your health care provider. Make sure you discuss any questions you have with your health care provider.  

## 2014-12-08 DIAGNOSIS — M25552 Pain in left hip: Secondary | ICD-10-CM

## 2014-12-08 DIAGNOSIS — M25551 Pain in right hip: Secondary | ICD-10-CM | POA: Insufficient documentation

## 2014-12-08 NOTE — Assessment & Plan Note (Signed)
Hip pain bilaterally, also had with prior pregnancy and was relieved with flexeril and lidoderm - rx flexeril and lidoderm - f/u in 4 weeks or sooner if not improving with this regimen

## 2014-12-08 NOTE — Progress Notes (Signed)
Opened in error. See OB encounter same day.

## 2014-12-08 NOTE — Progress Notes (Signed)
Marissa Contreras is a 24 y.o. [phone] [id number] at 4364w1d for routine follow up.  She reports hip pain worse with ambulation. No bleeding, LOF, contractions.  See flow sheet for details.  A/P: Pregnancy at 5064w1d.  Doing well.   Pregnancy issues include hip pain Anatomy scan reviewed, poor visualization due to fetal position, repeat scan scheduled later this month Preterm labor precautions reviewed. Follow up 4 weeks.

## 2014-12-08 NOTE — Patient Instructions (Signed)

## 2014-12-19 ENCOUNTER — Other Ambulatory Visit: Payer: Self-pay | Admitting: Family Medicine

## 2014-12-19 ENCOUNTER — Ambulatory Visit (HOSPITAL_COMMUNITY)
Admission: RE | Admit: 2014-12-19 | Discharge: 2014-12-19 | Disposition: A | Discharge status: Home / Self Care | Payer: Medicaid Other | Source: Ambulatory Visit | Attending: Family Medicine | Admitting: Family Medicine

## 2014-12-19 DIAGNOSIS — IMO0002 Reserved for concepts with insufficient information to code with codable children: Secondary | ICD-10-CM

## 2014-12-19 DIAGNOSIS — Z3A23 23 weeks gestation of pregnancy: Secondary | ICD-10-CM | POA: Diagnosis not present

## 2014-12-19 DIAGNOSIS — Z0489 Encounter for examination and observation for other specified reasons: Secondary | ICD-10-CM

## 2014-12-19 DIAGNOSIS — Z36 Encounter for antenatal screening of mother: Secondary | ICD-10-CM | POA: Insufficient documentation

## 2014-12-20 ENCOUNTER — Inpatient Hospital Stay (HOSPITAL_COMMUNITY)
Admission: AD | Admit: 2014-12-20 | Discharge: 2014-12-20 | Disposition: A | Discharge status: Home / Self Care | Payer: Medicaid Other | Source: Ambulatory Visit | Attending: Obstetrics & Gynecology | Admitting: Obstetrics & Gynecology

## 2014-12-20 ENCOUNTER — Encounter: Payer: Self-pay | Admitting: Family Medicine

## 2014-12-20 ENCOUNTER — Telehealth: Payer: Self-pay | Admitting: *Deleted

## 2014-12-20 DIAGNOSIS — O9989 Other specified diseases and conditions complicating pregnancy, childbirth and the puerperium: Secondary | ICD-10-CM | POA: Diagnosis not present

## 2014-12-20 DIAGNOSIS — R109 Unspecified abdominal pain: Secondary | ICD-10-CM | POA: Diagnosis not present

## 2014-12-20 DIAGNOSIS — O26899 Other specified pregnancy related conditions, unspecified trimester: Secondary | ICD-10-CM

## 2014-12-20 DIAGNOSIS — Z87891 Personal history of nicotine dependence: Secondary | ICD-10-CM | POA: Insufficient documentation

## 2014-12-20 DIAGNOSIS — J4520 Mild intermittent asthma, uncomplicated: Secondary | ICD-10-CM | POA: Insufficient documentation

## 2014-12-20 DIAGNOSIS — Z3A23 23 weeks gestation of pregnancy: Secondary | ICD-10-CM | POA: Diagnosis not present

## 2014-12-20 DIAGNOSIS — R1030 Lower abdominal pain, unspecified: Secondary | ICD-10-CM | POA: Diagnosis present

## 2014-12-20 DIAGNOSIS — R102 Pelvic and perineal pain: Secondary | ICD-10-CM | POA: Insufficient documentation

## 2014-12-20 LAB — URINALYSIS, ROUTINE W REFLEX MICROSCOPIC
BILIRUBIN URINE: NEGATIVE
Glucose, UA: NEGATIVE mg/dL
Hgb urine dipstick: NEGATIVE
Ketones, ur: NEGATIVE mg/dL
Leukocytes, UA: NEGATIVE
NITRITE: NEGATIVE
PH: 7 (ref 5.0–8.0)
Protein, ur: NEGATIVE mg/dL
SPECIFIC GRAVITY, URINE: 1.025 (ref 1.005–1.030)
Urobilinogen, UA: 0.2 mg/dL (ref 0.0–1.0)

## 2014-12-20 LAB — OB RESULTS CONSOLE GC/CHLAMYDIA: Gonorrhea: NEGATIVE

## 2014-12-20 LAB — WET PREP, GENITAL
Clue Cells Wet Prep HPF POC: NONE SEEN
Trich, Wet Prep: NONE SEEN
Yeast Wet Prep HPF POC: NONE SEEN

## 2014-12-20 NOTE — Telephone Encounter (Signed)
Left message on voicemail for patient to call back. 

## 2014-12-20 NOTE — Telephone Encounter (Signed)
-----   Message from Abram SanderElena M Adamo, MD sent at 12/20/2014  8:15 AM EDT ----- Please let Marissa Contreras know that her ultrasound yesterday was normal. Her baby looks healthy and is growing normally. Please also schedule her next OB follow-up appointment with me around April 15th.

## 2014-12-20 NOTE — MAU Note (Signed)
Urine in lab 

## 2014-12-20 NOTE — MAU Note (Signed)
Some nausea, denies vomiting, diarrhea or constipation. No urinary symptoms.

## 2014-12-20 NOTE — MAU Provider Note (Signed)
History     CSN: [id number]  Arrival date and time: 12/20/14 1342   First Provider Initiated Contact with Patient 12/20/14 1430      Chief Complaint  Patient presents with  . Abdominal Cramping   Abdominal Cramping This is a new problem. The current episode started today. The onset quality is gradual. The problem occurs constantly. The most recent episode lasted 1 day. The pain is located in the suprapubic region. The pain is at a severity of 5/10. The pain is mild. The quality of the pain is cramping. The abdominal pain does not radiate.    24 y.o. [id number] @[redacted]w[redacted]d  presents to the MAU with complaint of lower abdominal pain when she wakes up in the morning and at other times during the day. She denies vaginal bleeding, LOF, contractions.   Past Medical History  Diagnosis Date  . H/O varicella   . Back pain   . Chronic back pain   . Asthma     Past Surgical History  Procedure Laterality Date  . Tonsillectomy    . Dilation and evacuation N/A 03/08/2014    Procedure: DILATATION AND EVACUATION ;  Surgeon: Purcell NailsAngela Y Roberts, MD;  Location: [facility];  Service: Gynecology;  Laterality: N/A;  with chromosomal studies, sent     Family History  Problem Relation Age of Onset  . Hypertension Father   . Diabetes Father   . Fibromyalgia Mother   . Arthritis Mother   . Depression Mother   . Diabetes Paternal Grandmother   . Cancer Paternal Grandmother   . Stroke Paternal Grandmother     History  Substance Use Topics  . Smoking status: Former Smoker    Types: Cigarettes    Quit date: 11/21/2011  . Smokeless tobacco: Never Used     Comment: e-cig  . Alcohol Use: No    Allergies: No Known Allergies  Prescriptions prior to admission  Medication Sig Dispense Refill Last Dose  . cyclobenzaprine (FLEXERIL) 5 MG tablet Take 1 tablet (5 mg total) by mouth 3 (three) times daily as needed for muscle spasms. 90 tablet 1 12/19/2014 at Unknown time  . Prenatal Vit-Fe Fumarate-FA (PRENATAL  MULTIVITAMIN) TABS tablet Take 1 tablet by mouth daily at 12 noon. 90 tablet 4 Past Week at Unknown time  . clotrimazole (GYNE-LOTRIMIN) 1 % vaginal cream Place 1 Applicatorful vaginally at bedtime. Use for 7 days (Patient not taking: Reported on 12/20/2014) 45 g 0   . FLUoxetine (PROZAC) 40 MG capsule Take 1 capsule (40 mg total) by mouth daily. (Patient not taking: Reported on 12/20/2014) 30 capsule 12 Not Taking  . lidocaine (LIDODERM) 5 % Place 1 patch onto the skin daily. Remove & Discard patch within 12 hours or as directed by MD (Patient not taking: Reported on 12/20/2014) 60 patch 0   . metroNIDAZOLE (FLAGYL) 500 MG tablet Take 1 tablet (500 mg total) by mouth 2 (two) times daily. (Patient not taking: Reported on 12/20/2014) 14 tablet 0   . ondansetron (ZOFRAN ODT) 4 MG disintegrating tablet Take 1-2 tablets (4-8 mg total) by mouth every 8 (eight) hours as needed for nausea or vomiting. (Patient not taking: Reported on 12/20/2014) 100 tablet 1   . polyethylene glycol powder (GLYCOLAX/MIRALAX) powder Take 17 g by mouth 2 (two) times daily as needed. You may increase your dose as needed to as many as 8 caps per day. (Patient not taking: Reported on 12/20/2014) 3350 g 1     Review of Systems  Constitutional: Negative.  HENT: Negative.   Eyes: Negative.   Respiratory: Negative.   Cardiovascular: Negative.   Gastrointestinal: Positive for abdominal pain.  Genitourinary: Negative.   Musculoskeletal: Negative.   Skin: Negative.   Neurological: Negative.   Endo/Heme/Allergies: Negative.   Psychiatric/Behavioral: Negative.    Physical Exam   Blood pressure 134/81, pulse 118, temperature 98.2 F (36.8 C), temperature source Oral, resp. rate 18, height  (1.676 m), weight 78.019 kg (172 lb), last menstrual period 07/06/2014, SpO2 100 %, unknown if currently breastfeeding.  Results for orders placed or performed during the hospital encounter of 12/20/14 (from the past 24 hour(s))  Urinalysis,  Routine w reflex microscopic     Status: None   Collection Time: 12/20/14  2:00 PM  Result Value Ref Range   Color, Urine YELLOW YELLOW   APPearance CLEAR CLEAR   Specific Gravity, Urine 1.025 1.005 - 1.030   pH 7.0 5.0 - 8.0   Glucose, UA NEGATIVE NEGATIVE mg/dL   Hgb urine dipstick NEGATIVE NEGATIVE   Bilirubin Urine NEGATIVE NEGATIVE   Ketones, ur NEGATIVE NEGATIVE mg/dL   Protein, ur NEGATIVE NEGATIVE mg/dL   Urobilinogen, UA 0.2 0.0 - 1.0 mg/dL   Nitrite NEGATIVE NEGATIVE   Leukocytes, UA NEGATIVE NEGATIVE  Wet prep, genital     Status: Abnormal   Collection Time: 12/20/14  3:06 PM  Result Value Ref Range   Yeast Wet Prep HPF POC NONE SEEN NONE SEEN   Trich, Wet Prep NONE SEEN NONE SEEN   Clue Cells Wet Prep HPF POC NONE SEEN NONE SEEN   WBC, Wet Prep HPF POC FEW (A) NONE SEEN   FHT's Positive No uc's noted  Physical Exam  Nursing note and vitals reviewed. Constitutional: She is oriented to person, place, and time. She appears well-developed and well-nourished. No distress.  HENT:  Head: Normocephalic and atraumatic.  Neck: Normal range of motion.  Cardiovascular: Normal rate and regular rhythm.   Respiratory: Effort normal. No respiratory distress.  GI: Soft.  Genitourinary: Vagina normal. Uterus is enlarged. Cervix exhibits no motion tenderness, no discharge and no friability. Right adnexum displays no mass, no tenderness and no fullness. Left adnexum displays no mass, no tenderness and no fullness.  Musculoskeletal: Normal range of motion.  Neurological: She is alert and oriented to person, place, and time.  Skin: Skin is warm and dry.  Psychiatric: She has a normal mood and affect. Her behavior is normal. Judgment and thought content normal.    MAU Course  Procedures  MDM Spec Exam GC/Ch Wet prep UA  Assessment and Plan  Round Ligament Pain  Discharge to home Keep next regular scheduled appt  Clemmons,Lori Grissett 12/20/2014, 4:00 PM

## 2014-12-20 NOTE — MAU Note (Signed)
For about the past wk, whenever she gets out of bed, about 5 min later she gets what feels like period cramps.  Lasts all day, whatever she does it doesn't stop.  Called MD, told to come here.

## 2014-12-20 NOTE — Discharge Instructions (Signed)

## 2014-12-20 NOTE — MAU Note (Signed)
Pt states she is experiencing cramping across lower abdomen. Rates pain 3/10. Has experienced this pain for the past week, with continuous discomfort. No bleeding or discharge.

## 2014-12-21 LAB — GC/CHLAMYDIA PROBE AMP (~~LOC~~) NOT AT ARMC
Chlamydia: NEGATIVE
Neisseria Gonorrhea: NEGATIVE

## 2014-12-21 LAB — HIV ANTIBODY (ROUTINE TESTING W REFLEX): HIV Screen 4th Generation wRfx: NONREACTIVE

## 2015-01-05 ENCOUNTER — Encounter (HOSPITAL_COMMUNITY): Payer: Self-pay

## 2015-01-05 ENCOUNTER — Inpatient Hospital Stay (HOSPITAL_COMMUNITY)
Admission: AD | Admit: 2015-01-05 | Discharge: 2015-01-05 | Disposition: A | Discharge status: Home / Self Care | Payer: Medicaid Other | Source: Ambulatory Visit | Attending: Obstetrics & Gynecology | Admitting: Obstetrics & Gynecology

## 2015-01-05 DIAGNOSIS — M79606 Pain in leg, unspecified: Secondary | ICD-10-CM | POA: Diagnosis not present

## 2015-01-05 DIAGNOSIS — R109 Unspecified abdominal pain: Secondary | ICD-10-CM | POA: Insufficient documentation

## 2015-01-05 DIAGNOSIS — O9989 Other specified diseases and conditions complicating pregnancy, childbirth and the puerperium: Secondary | ICD-10-CM | POA: Diagnosis not present

## 2015-01-05 DIAGNOSIS — Z3A26 26 weeks gestation of pregnancy: Secondary | ICD-10-CM | POA: Diagnosis not present

## 2015-01-05 DIAGNOSIS — Z3A36 36 weeks gestation of pregnancy: Secondary | ICD-10-CM

## 2015-01-05 DIAGNOSIS — M5430 Sciatica, unspecified side: Secondary | ICD-10-CM | POA: Insufficient documentation

## 2015-01-05 DIAGNOSIS — Z87891 Personal history of nicotine dependence: Secondary | ICD-10-CM | POA: Insufficient documentation

## 2015-01-05 LAB — URINALYSIS, ROUTINE W REFLEX MICROSCOPIC
BILIRUBIN URINE: NEGATIVE
Glucose, UA: NEGATIVE mg/dL
HGB URINE DIPSTICK: NEGATIVE
Ketones, ur: NEGATIVE mg/dL
Nitrite: NEGATIVE
PROTEIN: NEGATIVE mg/dL
Specific Gravity, Urine: <1.005 — ABNORMAL LOW (ref 1.005–1.030)
UROBILINOGEN UA: 0.2 mg/dL (ref 0.0–1.0)
pH: 6 (ref 5.0–8.0)

## 2015-01-05 LAB — URINE MICROSCOPIC-ADD ON

## 2015-01-05 NOTE — Discharge Instructions (Signed)

## 2015-01-05 NOTE — MAU Provider Note (Signed)
History     CSN: [id number]  Arrival date and time: 01/05/15 4098   First Provider Initiated Contact with Patient 01/05/15 [id number]      Chief Complaint  Patient presents with  . Abdominal Pain  . Leg Pain    HPI:   Marissa Contreras is a 24 year old G5P1031, currently [redacted]w[redacted]d, who presents to Maternity Admission for increased pelvic pressure and bilateral hip and leg pain. She reports the increased abdominal pressure started this morning and "feels like the baby is going to fall out". She denies any loss of fluids, discharge, or vaginal bleeding. She does report having sexual intercourse yesterday and says she experienced pain during intercourse.  She reports her bilateral hip and leg pain has been present for several years, as she has been diagnosed with chronic back pain and sciatica. She reports a painful sensation going down both legs and into her feet, but says the pain is worse on the left. She also reports decreased sensation on the left. She has been taking Flexeril and Tylenol for the pain since finding out she was pregnant. She was taking Percocet and Flexeril before. She says her pain has increased in severity over the past three days and is now a constant 6/10. She reports changes in position do not help her pain and the medications do not relieve the pain. She last took Flexeril last night.   OB History    Gravida Para Term Preterm AB TAB SAB Ectopic Multiple Living   Past Medical History  Diagnosis Date  . H/O varicella   . Back pain   . Chronic back pain   . Asthma     Past Surgical History  Procedure Laterality Date  . Tonsillectomy    . Dilation and evacuation N/A 03/08/2014    Procedure: DILATATION AND EVACUATION ;  Surgeon: Purcell Nails, MD;  Location: [facility];  Service: Gynecology;  Laterality: N/A;  with chromosomal studies, sent     Family History  Problem Relation Age of Onset  . Hypertension Father   . Diabetes Father   .  Fibromyalgia Mother   . Arthritis Mother   . Depression Mother   . Diabetes Paternal Grandmother   . Cancer Paternal Grandmother   . Stroke Paternal Grandmother     History  Substance Use Topics  . Smoking status: Former Smoker    Types: Cigarettes    Quit date: 11/21/2011  . Smokeless tobacco: Never Used     Comment: e-cig  . Alcohol Use: No    Allergies: No Known Allergies  Prescriptions prior to admission  Medication Sig Dispense Refill Last Dose  . ondansetron (ZOFRAN ODT) 4 MG disintegrating tablet Take 1-2 tablets (4-8 mg total) by mouth every 8 (eight) hours as needed for nausea or vomiting. (Patient not taking: Reported on 12/20/2014) 100 tablet 1     Review of Systems  Constitutional: Negative for fever, chills and malaise/fatigue.  HENT: Negative for congestion, nosebleeds and sore throat.   Eyes: Negative for double vision and pain.  Respiratory: Positive for shortness of breath. Negative for cough, hemoptysis, sputum production and wheezing.   Cardiovascular: Negative for chest pain, palpitations and leg swelling.  Gastrointestinal: Positive for nausea. Negative for vomiting, diarrhea, constipation and blood in stool. Abdominal pain: suprapubic.  Genitourinary: Negative for dysuria, urgency, frequency and hematuria.  Musculoskeletal: Positive for myalgias and back pain. Negative for joint pain.  Neurological: Negative for dizziness, focal weakness, loss of consciousness and headaches.   Physical Exam   Blood pressure 104/65, pulse 112, resp. rate 16, height 5\' 6"  (1.676 m), weight 80.74 kg (178 lb), last menstrual period 07/06/2014, unknown if currently breastfeeding.  Physical Exam  Nursing note and vitals reviewed. Constitutional: She is oriented to person, place, and time. She appears well-developed and well-nourished. No distress.  HENT:  Head: Normocephalic and atraumatic.  Eyes: Pupils are equal, round, and reactive to light. Right eye exhibits no  discharge. Left eye exhibits no discharge.  Neck: Normal range of motion. Neck supple. No thyromegaly present.  Cardiovascular: Normal rate, regular rhythm, normal heart sounds and intact distal pulses.  Exam reveals no gallop and no friction rub.   No murmur heard. Respiratory: Effort normal and breath sounds normal. No stridor. No respiratory distress. She has no wheezes. She has no rales. She exhibits no tenderness.  GI: Soft. Bowel sounds are normal. She exhibits no distension and no mass. There is no tenderness. There is no rebound and no guarding.  Genitourinary:  No visible discharge. Gravid uterus. Cervix closed and thick.  Musculoskeletal: She exhibits tenderness. She exhibits no edema.  Positive straight leg raise bilaterally. Tender to palpation along anterior and posterior pelvis.  Lymphadenopathy:    She has no cervical adenopathy.  Neurological: She is alert and oriented to person, place, and time. She has normal reflexes.  Sensation intact along lower extremities bilaterally.   Skin: Skin is warm and dry. No rash noted. She is not diaphoretic. No erythema. No pallor.    MAU Course  Procedures  MDM:  Fetal heart rate is reassuring. Patient is not having contractions at this time. Unable to obtain fetal fibronectin due to patient having intercourse yesterday. UA unremarkable. GC/Chlaymdia cultures and wet prep obtained at MAU visit on 3/30 and were negative.    Assessment and Plan  A: Abdominal Pressure P:  Most consistent with round ligament pain.  Continue with routine prenatal visits. A: Leg Pain P:  Continue Tylenol and Flexeril as prescribed by PCP.  Patient informed she needs [facility]. Said she did not have any appointments scheduled with them, but will call and schedule one today.      Wandra MannanManning, Brittany, PA-Student 01/05/2015, 9:15 AM   CNM attestation:  I have seen and examined this patient; I agree with above  documentation in the PA student's note.   Marissa Machooni Horen is a 24 y.o. [phone] reporting hip/leg discomfort and pressure. +FM, denies LOF, VB, contractions, vaginal discharge.  PE: BP 126/79 mmHg  Pulse 106  Temp(Src) 98.3 F (36.8 C) (Oral)  Resp 16  Ht 5\' 6"  (1.676 m)  Wt 80.74 kg (178 lb)  BMI 28.74 kg/m2  LMP 07/06/2014 (Within Weeks) Gen: calm comfortable, NAD Resp: normal effort, no distress Abd: gravid Cx: C/L  ROS, labs, PMH reviewed NST 145-150, +accels, no decels No ctx per toco  IUP@26 .1wks Sciatic pain Round lig discomfort  Plan: -  preterm labor precautions - comfort measures rev'd - counseled pt re calling [facility] with concerns prior to coming to MAU; in this conversation discovered that pt doesn't have her next appt scheduled yet and needs to make that  Cam HaiSHAW, Carmen Vallecillo, CNM 10:26 AM  01/05/2015

## 2015-01-12 NOTE — Telephone Encounter (Signed)
Left another message on voicemail for patient to call back and schedule appointment.

## 2015-01-19 ENCOUNTER — Ambulatory Visit (INDEPENDENT_AMBULATORY_CARE_PROVIDER_SITE_OTHER): Payer: Medicaid Other | Admitting: Family Medicine

## 2015-01-19 DIAGNOSIS — Z3483 Encounter for supervision of other normal pregnancy, third trimester: Secondary | ICD-10-CM

## 2015-01-19 LAB — CBC
HCT: 29.5 % — ABNORMAL LOW (ref 36.0–46.0)
Hemoglobin: 9.7 g/dL — ABNORMAL LOW (ref 12.0–15.0)
MCH: 28.2 pg (ref 26.0–34.0)
MCHC: 32.9 g/dL (ref 30.0–36.0)
MCV: 85.8 fL (ref 78.0–100.0)
MPV: 9.1 fL (ref 8.6–12.4)
Platelets: 297 10*3/uL (ref 150–400)
RBC: 3.44 MIL/uL — ABNORMAL LOW (ref 3.87–5.11)
RDW: 13.4 % (ref 11.5–15.5)
WBC: 11 10*3/uL — ABNORMAL HIGH (ref 4.0–10.5)

## 2015-01-19 LAB — POCT URINALYSIS DIPSTICK
BILIRUBIN UA: NEGATIVE
Blood, UA: NEGATIVE
Glucose, UA: NEGATIVE
Ketones, UA: NEGATIVE
Nitrite, UA: NEGATIVE
SPEC GRAV UA: 1.02
UROBILINOGEN UA: 0.2
pH, UA: 7

## 2015-01-19 LAB — GLUCOSE, CAPILLARY
Comment 1: 1
GLUCOSE-CAPILLARY: 88 mg/dL (ref 70–99)

## 2015-01-19 LAB — POCT UA - MICROSCOPIC ONLY

## 2015-01-19 NOTE — Addendum Note (Signed)
Addended by: Jennette BillBUSICK, ROBERT L on: 01/19/2015 05:08 PM   Modules accepted: Orders

## 2015-01-19 NOTE — Patient Instructions (Signed)
Third Trimester of Pregnancy The third trimester is from week 29 through week 42, months 7 through 9. The third trimester is a time when the fetus is growing rapidly. At the end of the ninth month, the fetus is about 20 inches in length and weighs 6-10 pounds.  BODY CHANGES Your body goes through many changes during pregnancy. The changes vary from woman to woman.   Your weight will continue to increase. You can expect to gain 25-35 pounds (11-16 kg) by the end of the pregnancy.  You may begin to get stretch marks on your hips, abdomen, and breasts.  You may urinate more often because the fetus is moving lower into your pelvis and pressing on your bladder.  You may develop or continue to have heartburn as a result of your pregnancy.  You may develop constipation because certain hormones are causing the muscles that push waste through your intestines to slow down.  You may develop hemorrhoids or swollen, bulging veins (varicose veins).  You may have pelvic pain because of the weight gain and pregnancy hormones relaxing your joints between the bones in your pelvis. Backaches may result from overexertion of the muscles supporting your posture.  You may have changes in your hair. These can include thickening of your hair, rapid growth, and changes in texture. Some women also have hair loss during or after pregnancy, or hair that feels dry or thin. Your hair will most likely return to normal after your baby is born.  Your breasts will continue to grow and be tender. A yellow discharge may leak from your breasts called colostrum.  Your belly button may stick out.  You may feel short of breath because of your expanding uterus.  You may notice the fetus "dropping," or moving lower in your abdomen.  You may have a bloody mucus discharge. This usually occurs a few days to a week before labor begins.  Your cervix becomes thin and soft (effaced) near your due date. WHAT TO EXPECT AT YOUR PRENATAL  EXAMS  You will have prenatal exams every 2 weeks until week 36. Then, you will have weekly prenatal exams. During a routine prenatal visit:  You will be weighed to make sure you and the fetus are growing normally.  Your blood pressure is taken.  Your abdomen will be measured to track your baby's growth.  The fetal heartbeat will be listened to.  Any test results from the previous visit will be discussed.  You may have a cervical check near your due date to see if you have effaced. At around 36 weeks, your caregiver will check your cervix. At the same time, your caregiver will also perform a test on the secretions of the vaginal tissue. This test is to determine if a type of bacteria, Group B streptococcus, is present. Your caregiver will explain this further. Your caregiver may ask you:  What your birth plan is.  How you are feeling.  If you are feeling the baby move.  If you have had any abnormal symptoms, such as leaking fluid, bleeding, severe headaches, or abdominal cramping.  If you have any questions. Other tests or screenings that may be performed during your third trimester include:  Blood tests that check for low iron levels (anemia).  Fetal testing to check the health, activity level, and growth of the fetus. Testing is done if you have certain medical conditions or if there are problems during the pregnancy. FALSE LABOR You may feel small, irregular contractions that   eventually go away. These are called Braxton Hicks contractions, or false labor. Contractions may last for hours, days, or even weeks before true labor sets in. If contractions come at regular intervals, intensify, or become painful, it is best to be seen by your caregiver.  SIGNS OF LABOR   Menstrual-like cramps.  Contractions that are 5 minutes apart or less.  Contractions that start on the top of the uterus and spread down to the lower abdomen and back.  A sense of increased pelvic pressure or back  pain.  A watery or bloody mucus discharge that comes from the vagina. If you have any of these signs before the 37th week of pregnancy, call your caregiver right away. You need to go to the hospital to get checked immediately. HOME CARE INSTRUCTIONS   Avoid all smoking, herbs, alcohol, and unprescribed drugs. These chemicals affect the formation and growth of the baby.  Follow your caregiver's instructions regarding medicine use. There are medicines that are either safe or unsafe to take during pregnancy.  Exercise only as directed by your caregiver. Experiencing uterine cramps is a good sign to stop exercising.  Continue to eat regular, healthy meals.  Wear a good support bra for breast tenderness.  Do not use hot tubs, steam rooms, or saunas.  Wear your seat belt at all times when driving.  Avoid raw meat, uncooked cheese, cat litter boxes, and soil used by cats. These carry germs that can cause birth defects in the baby.  Take your prenatal vitamins.  Try taking a stool softener (if your caregiver approves) if you develop constipation. Eat more high-fiber foods, such as fresh vegetables or fruit and whole grains. Drink plenty of fluids to keep your urine clear or pale yellow.  Take warm sitz baths to soothe any pain or discomfort caused by hemorrhoids. Use hemorrhoid cream if your caregiver approves.  If you develop varicose veins, wear support hose. Elevate your feet for 15 minutes, 3-4 times a day. Limit salt in your diet.  Avoid heavy lifting, wear low heal shoes, and practice good posture.  Rest a lot with your legs elevated if you have leg cramps or low back pain.  Visit your dentist if you have not gone during your pregnancy. Use a soft toothbrush to brush your teeth and be gentle when you floss.  A sexual relationship may be continued unless your caregiver directs you otherwise.  Do not travel far distances unless it is absolutely necessary and only with the approval  of your caregiver.  Take prenatal classes to understand, practice, and ask questions about the labor and delivery.  Make a trial run to the hospital.  Pack your hospital bag.  Prepare the baby's nursery.  Continue to go to all your prenatal visits as directed by your caregiver. SEEK MEDICAL CARE IF:  You are unsure if you are in labor or if your water has broken.  You have dizziness.  You have mild pelvic cramps, pelvic pressure, or nagging pain in your abdominal area.  You have persistent nausea, vomiting, or diarrhea.  You have a bad smelling vaginal discharge.  You have pain with urination. SEEK IMMEDIATE MEDICAL CARE IF:   You have a fever.  You are leaking fluid from your vagina.  You have spotting or bleeding from your vagina.  You have severe abdominal cramping or pain.  You have rapid weight loss or gain.  You have shortness of breath with chest pain.  You notice sudden or extreme swelling   of your face, hands, ankles, feet, or legs.  You have not felt your baby move in over an hour.  You have severe headaches that do not go away with medicine.  You have vision changes. Document Released: 09/02/2001 Document Revised: 09/13/2013 Document Reviewed: 11/09/2012 ExitCare Patient Information 2015 [employer]. This information is not intended to replace advice given to you by your health care provider. Make sure you discuss any questions you have with your health care provider.  

## 2015-01-19 NOTE — Progress Notes (Signed)
Marissa Contreras is a 24 y.o. [phone] at 1075w1d for routine follow up.  She reports occasional constipation. See flow sheet for details.  A/P: Pregnancy at [redacted]w[redacted]d.  Doing well.   Pregnancy issues include none  Infant feeding choice breast  Contraception choice undecided Infant circumcision desired not applicable  Tdapwas not given today. 1 hour glucola, CBC, RPR, and HIV were done today.  .   RH status was reviewed and pt does not need Rhogam.  Rhogam was not given today.   Childbirth and education classes were offered. Preterm labor precautions reviewed. Kick counts reviewed. Follow up 2 weeks.

## 2015-01-20 LAB — HIV ANTIBODY (ROUTINE TESTING W REFLEX): HIV 1&2 Ab, 4th Generation: NONREACTIVE

## 2015-01-20 LAB — RPR

## 2015-01-25 ENCOUNTER — Telehealth: Payer: Self-pay | Admitting: *Deleted

## 2015-01-25 NOTE — Telephone Encounter (Signed)
Left message on voicemail for patient to return call. 

## 2015-01-25 NOTE — Telephone Encounter (Signed)
-----   Message from Abram SanderElena M Adamo, MD sent at 01/22/2015  1:33 PM EDT ----- Please let the patient know that her labs are relatively normal. She does not have gestational diabetes. Her urine had some cells and protein in it which is probably normal but we will recheck it in 2 weeks and she should let me know if she has worsening swelling, abdominal pain, or burning with urination. Please also try to get her to make that next ob appointment in 2 weeks if you can. Thanks!

## 2015-01-27 ENCOUNTER — Inpatient Hospital Stay (HOSPITAL_COMMUNITY)
Admission: AD | Admit: 2015-01-27 | Discharge: 2015-01-27 | Disposition: A | Discharge status: Home / Self Care | Payer: Medicaid Other | Source: Ambulatory Visit | Attending: Obstetrics and Gynecology | Admitting: Obstetrics and Gynecology

## 2015-01-27 DIAGNOSIS — Z3490 Encounter for supervision of normal pregnancy, unspecified, unspecified trimester: Secondary | ICD-10-CM

## 2015-01-27 DIAGNOSIS — Z331 Pregnant state, incidental: Secondary | ICD-10-CM

## 2015-01-27 NOTE — MAU Note (Signed)
Vonzella NippleJulie Wenzel, PA in triage room with patient/pt's mother to discuss lab results. No abnormal vaginal discharge or bleeding. Here for test results only.

## 2015-01-27 NOTE — MAU Provider Note (Signed)
[name] is a 24 y.o. [phone] at 7766w2d who presents to MAU today without complaint requesting results from recent prenatal visit at [facility].   BP 115/69 mmHg  Temp(Src) 98.7 F (37.1 C) (Oral)  Resp 16  LMP 07/06/2014 (Within Weeks)  Breastfeeding? No  CONSTITUTIONAL: Well-developed, well-nourished female in no acute distress.  EYES: EOM intact, conjunctivae normal.  CARDIOVASCULAR: Regular heart rate RESPIRATORY: Normal effort NEUROLOGICAL: Alert and oriented to person, place, and time.  SKIN: Skin is warm and dry. No rash noted. Not diaphoretic. No erythema. No pallor. PSYCH: Normal mood and affect. Normal behavior. Normal judgment and thought content.   Result note from Dr. Richarda BladeAdamo read to patient. Patient voiced understanding.   Marissa LowensteinJulie N Laketha Leopard, PA-C 01/27/2015 1:14 PM

## 2015-01-27 NOTE — Discharge Instructions (Signed)
Third Trimester of Pregnancy The third trimester is from week 29 through week 42, months 7 through 9. This trimester is when your unborn baby (fetus) is growing very fast. At the end of the ninth month, the unborn baby is about 20 inches in length. It weighs about 6-10 pounds.  HOME CARE   Avoid all smoking, herbs, and alcohol. Avoid drugs not approved by your doctor.  Only take medicine as told by your doctor. Some medicines are safe and some are not during pregnancy.  Exercise only as told by your doctor. Stop exercising if you start having cramps.  Eat regular, healthy meals.  Wear a good support bra if your breasts are tender.  Do not use hot tubs, steam rooms, or saunas.  Wear your seat belt when driving.  Avoid raw meat, uncooked cheese, and liter boxes and soil used by cats.  Take your prenatal vitamins.  Try taking medicine that helps you poop (stool softener) as needed, and if your doctor approves. Eat more fiber by eating fresh fruit, vegetables, and whole grains. Drink enough fluids to keep your pee (urine) clear or pale yellow.  Take warm water baths (sitz baths) to soothe pain or discomfort caused by hemorrhoids. Use hemorrhoid cream if your doctor approves.  If you have puffy, bulging veins (varicose veins), wear support hose. Raise (elevate) your feet for 15 minutes, 3-4 times a day. Limit salt in your diet.  Avoid heavy lifting, wear low heels, and sit up straight.  Rest with your legs raised if you have leg cramps or low back pain.  Visit your dentist if you have not gone during your pregnancy. Use a soft toothbrush to brush your teeth. Be gentle when you floss.  You can have sex (intercourse) unless your doctor tells you not to.  Do not travel far distances unless you must. Only do so with your doctor's approval.  Take prenatal classes.  Practice driving to the hospital.  Pack your hospital bag.  Prepare the baby's room.  Go to your doctor visits. GET  HELP IF:  You are not sure if you are in labor or if your water has broken.  You are dizzy.  You have mild cramps or pressure in your lower belly (abdominal).  You have a nagging pain in your belly area.  You continue to feel sick to your stomach (nauseous), throw up (vomit), or have watery poop (diarrhea).  You have bad smelling fluid coming from your vagina.  You have pain with peeing (urination). GET HELP RIGHT AWAY IF:   You have a fever.  You are leaking fluid from your vagina.  You are spotting or bleeding from your vagina.  You have severe belly cramping or pain.  You lose or gain weight rapidly.  You have trouble catching your breath and have chest pain.  You notice sudden or extreme puffiness (swelling) of your face, hands, ankles, feet, or legs.  You have not felt the baby move in over an hour.  You have severe headaches that do not go away with medicine.  You have vision changes. Document Released: 12/03/2009 Document Revised: 01/03/2013 Document Reviewed: 11/09/2012 ExitCare Patient Information 2015 [employer]. This information is not intended to replace advice given to you by your health care provider. Make sure you discuss any questions you have with your health care provider.  

## 2015-02-08 ENCOUNTER — Ambulatory Visit (INDEPENDENT_AMBULATORY_CARE_PROVIDER_SITE_OTHER): Payer: Medicaid Other | Admitting: Family Medicine

## 2015-02-08 VITALS — BP 108/62 | HR 125 | Temp 98.1°F | Wt 184.0 lb

## 2015-02-08 DIAGNOSIS — Z3483 Encounter for supervision of other normal pregnancy, third trimester: Secondary | ICD-10-CM

## 2015-02-08 DIAGNOSIS — Z23 Encounter for immunization: Secondary | ICD-10-CM | POA: Diagnosis not present

## 2015-02-08 DIAGNOSIS — J0110 Acute frontal sinusitis, unspecified: Secondary | ICD-10-CM | POA: Insufficient documentation

## 2015-02-08 LAB — POCT URINALYSIS DIPSTICK
Bilirubin, UA: NEGATIVE
Glucose, UA: NEGATIVE
Ketones, UA: NEGATIVE
Leukocytes, UA: NEGATIVE
NITRITE UA: NEGATIVE
PH UA: 6.5
PROTEIN UA: NEGATIVE
RBC UA: NEGATIVE
SPEC GRAV UA: 1.01
UROBILINOGEN UA: 0.2

## 2015-02-08 MED ORDER — AMOXICILLIN-POT CLAVULANATE 875-125 MG PO TABS: 1.0000 | ORAL_TABLET | Freq: Two times a day (BID) | ORAL | Status: DC

## 2015-02-08 NOTE — Assessment & Plan Note (Addendum)
Rhinorrhea, frontal sinus tenderness and facial pain/pressure for 2 weeks - augmentin bid x7 days

## 2015-02-08 NOTE — Addendum Note (Signed)
Addended by: Garen GramsBENTON, Akiah Bauch F on: 02/08/2015 03:21 PM   Modules accepted: Orders

## 2015-02-08 NOTE — Progress Notes (Signed)
Franky Machooni Mcnab is a 24 y.o. G5P1031 at 7140w0d for routine follow up.  She reports hip and back pain which is stable. Normal FM. No bleeding, LOF, ctx.  See flow sheet for details.  A/P: Pregnancy at 3740w0d.  Doing well.   Pregnancy issues include trace protein on last UA, negative today  Infant feeding choice breast Contraception choice undecided Infant circumcision desired not applicable  Tdapwas given today. GBS/GC/CZ testing was not performed today. Plan at 35  Preterm labor precautions reviewed. Safe sleep discussed. Kick counts reviewed. Follow up 2 weeks.

## 2015-02-08 NOTE — Patient Instructions (Signed)

## 2015-02-08 NOTE — Addendum Note (Signed)
Addended by: Abram SanderADAMO, Edin Skarda M on: 02/08/2015 03:23 PM   Modules accepted: Orders

## 2015-02-17 ENCOUNTER — Encounter (HOSPITAL_COMMUNITY): Payer: Self-pay | Admitting: *Deleted

## 2015-02-17 ENCOUNTER — Inpatient Hospital Stay (HOSPITAL_COMMUNITY)
Admission: AD | Admit: 2015-02-17 | Discharge: 2015-02-17 | Disposition: A | Discharge status: Home / Self Care | Payer: Medicaid Other | Source: Ambulatory Visit | Attending: Obstetrics & Gynecology | Admitting: Obstetrics & Gynecology

## 2015-02-17 DIAGNOSIS — Z8709 Personal history of other diseases of the respiratory system: Secondary | ICD-10-CM | POA: Insufficient documentation

## 2015-02-17 DIAGNOSIS — Z87891 Personal history of nicotine dependence: Secondary | ICD-10-CM | POA: Insufficient documentation

## 2015-02-17 DIAGNOSIS — Z833 Family history of diabetes mellitus: Secondary | ICD-10-CM | POA: Diagnosis not present

## 2015-02-17 DIAGNOSIS — K219 Gastro-esophageal reflux disease without esophagitis: Secondary | ICD-10-CM

## 2015-02-17 DIAGNOSIS — Z8249 Family history of ischemic heart disease and other diseases of the circulatory system: Secondary | ICD-10-CM | POA: Diagnosis not present

## 2015-02-17 DIAGNOSIS — O219 Vomiting of pregnancy, unspecified: Secondary | ICD-10-CM | POA: Diagnosis not present

## 2015-02-17 DIAGNOSIS — Z3A32 32 weeks gestation of pregnancy: Secondary | ICD-10-CM | POA: Diagnosis not present

## 2015-02-17 DIAGNOSIS — R42 Dizziness and giddiness: Secondary | ICD-10-CM | POA: Diagnosis present

## 2015-02-17 LAB — URINALYSIS, ROUTINE W REFLEX MICROSCOPIC
Bilirubin Urine: NEGATIVE
Glucose, UA: NEGATIVE mg/dL
Hgb urine dipstick: NEGATIVE
KETONES UR: 15 mg/dL — AB
NITRITE: NEGATIVE
PH: 7 (ref 5.0–8.0)
Protein, ur: 30 mg/dL — AB
Specific Gravity, Urine: 1.025 (ref 1.005–1.030)
Urobilinogen, UA: 1 mg/dL (ref 0.0–1.0)

## 2015-02-17 LAB — URINE MICROSCOPIC-ADD ON

## 2015-02-17 MED ORDER — CALCIUM CARBONATE ANTACID 500 MG PO CHEW: 1.0000 | CHEWABLE_TABLET | Freq: Every day | ORAL | Status: DC

## 2015-02-17 MED ORDER — ONDANSETRON HCL 4 MG PO TABS
4.0000 mg | ORAL_TABLET | Freq: Once | ORAL | Status: AC
  Administered 2015-02-17: 4 mg via ORAL
  Filled 2015-02-17: qty 1

## 2015-02-17 NOTE — Discharge Instructions (Signed)
Gastritis, Adult °Gastritis is soreness and puffiness (inflammation) of the lining of the stomach. If you do not get help, gastritis can cause bleeding and sores (ulcers) in the stomach. °HOME CARE  °· Only take medicine as told by your doctor. °· If you were given antibiotic medicines, take them as told. Finish the medicines even if you start to feel better. °· Drink enough fluids to keep your pee (urine) clear or pale yellow. °· Avoid foods and drinks that make your problems worse. Foods you may want to avoid include: °¨ Caffeine or alcohol. °¨ Chocolate. °¨ Mint. °¨ Garlic and onions. °¨ Spicy foods. °¨ Citrus fruits, including oranges, lemons, or limes. °¨ Food containing tomatoes, including sauce, chili, salsa, and pizza. °¨ Fried and fatty foods. °· Eat small meals throughout the day instead of large meals. °GET HELP RIGHT AWAY IF:  °· You have black or dark red poop (stools). °· You throw up (vomit) blood. It may look like coffee grounds. °· You cannot keep fluids down. °· Your belly (abdominal) pain gets worse. °· You have a fever. °· You do not feel better after 1 week. °· You have any other questions or concerns. °MAKE SURE YOU:  °· Understand these instructions. °· Will watch your condition. °· Will get help right away if you are not doing well or get worse. °Document Released: 02/25/2008 Document Revised: 12/01/2011 Document Reviewed: 10/22/2011 °ExitCare® Patient Information ©2015 [employer]. This information is not intended to replace advice given to you by your health care provider. Make sure you discuss any questions you have with your health care provider. ° °

## 2015-02-17 NOTE — MAU Provider Note (Signed)
History   CSN: [id number]  Arrival date and time: 02/17/15 1827  Chief Complaint  Patient presents with  . Dizziness  . Emesis   HPI  Patient is 24 y.o. [id number] [redacted]w[redacted]d here with complaints of feeling feverish, dizzy, shaky, and nauseated. Patient states that she ate [place] earlier in the day and then on the way home she felt the above symptoms. She reports emesis times 1. States that the episode only last for a few seconds. She denies chest pain. Denies any history of reflux. States that she has not been well hydrated.   +FM, denies LOF, VB, contractions, vaginal discharge.   OB History    Gravida Para Term Preterm AB TAB SAB Ectopic Multiple Living   -Miscarriage x3 (GA for latest was 15wks)   Past Medical History  Diagnosis Date  . H/O varicella   . Back pain   . Chronic back pain   . Asthma     Past Surgical History  Procedure Laterality Date  . Tonsillectomy    . Dilation and evacuation N/A 03/08/2014    Procedure: DILATATION AND EVACUATION ;  Surgeon: Purcell Nails, MD;  Location: [facility];  Service: Gynecology;  Laterality: N/A;  with chromosomal studies, sent     Family History  Problem Relation Age of Onset  . Hypertension Father   . Diabetes Father   . Fibromyalgia Mother   . Arthritis Mother   . Depression Mother   . Diabetes Paternal Grandmother   . Cancer Paternal Grandmother   . Stroke Paternal Grandmother     History  Substance Use Topics  . Smoking status: Former Smoker    Types: Cigarettes    Quit date: 11/21/2011  . Smokeless tobacco: Never Used     Comment: e-cig  . Alcohol Use: No    Allergies: No Known Allergies  Prescriptions prior to admission  Medication Sig Dispense Refill Last Dose  . acetaminophen (TYLENOL) 500 MG tablet Take 500 mg by mouth every 6 (six) hours as needed for mild pain.   Taking  . amoxicillin-clavulanate (AUGMENTIN) 875-125 MG per tablet Take 1 tablet by mouth 2 (two) times daily. 14 tablet  0   . cyclobenzaprine (FLEXERIL) 5 MG tablet Take 5 mg by mouth 3 (three) times daily as needed for muscle spasms.   Taking  . polyethylene glycol powder (GLYCOLAX/MIRALAX) powder   0   . Prenatal Vit-Fe Fumarate-FA (PRENATAL MULTIVITAMIN) TABS tablet Take 1 tablet by mouth daily at 12 noon.   Taking    Review of Systems  Constitutional: Negative for fever and chills.  Eyes: Negative for blurred vision and double vision.  Respiratory: Negative for shortness of breath.   Cardiovascular: Negative for chest pain and leg swelling.  Gastrointestinal: Positive for nausea and vomiting. Negative for constipation.  Genitourinary: Negative for dysuria.  Neurological: Positive for dizziness. Negative for headaches.   Physical Exam   Blood pressure 131/86, pulse 104, temperature 98.3 F (36.8 C), temperature source Oral, resp. rate 17, height  (1.676 m), weight 181 lb (82.101 kg), last menstrual period 07/06/2014.  Physical Exam  Constitutional: She is oriented to person, place, and time. She appears well-developed and well-nourished. No distress.  HENT:  Head: Normocephalic and atraumatic.  Eyes: EOM are normal.  Cardiovascular: Regular rhythm, normal heart sounds and intact distal pulses.  Tachycardia present.   Respiratory: Effort normal and breath sounds normal.  GI: Soft. Bowel  sounds are normal. There is no tenderness.  Musculoskeletal: Normal range of motion. She exhibits no edema or tenderness.  Neurological: She is alert and oriented to person, place, and time.  Skin: Skin is warm and dry.    MAU Course  Procedures - None  MDM -NST reassuring and reactive -SVE with closed/thick/high cervix -zofran x1 -PO hydrated -UA contaminated -Orthostatics unremarkable   Assessment and Plan  Patient is 24 y.o. [id number] reporting emesis x1 likely secondary to reflux causing a vasovagal response. Patient advised to avoid inciting food that caused event.  - Encouraged continued  hydration - Rx for Tums - fetal kick counts reinforced - preterm labor precautions  Caryl AdaJazma Phelps, DO 02/17/2015, 7:08 PM PGY-1, [facility]

## 2015-02-17 NOTE — MAU Note (Signed)
Pt presents to MAU with complaints of dizziness and vomiting today. Denies any bleeding or LOF

## 2015-02-20 IMAGING — US US OB COMP LESS 14 WK
1 series · 14 of 28 positions shown · non-contrast
Comparison: None.

CLINICAL DATA: First trimester pregnancy with uncertain dates.
Unsure of LMP.

EXAM:
OBSTETRIC <14 WK ULTRASOUND
TECHNIQUE: Transabdominal ultrasound was performed for evaluation of the
gestation as well as the maternal uterus and adnexal regions.

[Series 1: us ob comp less 14 wk · 0.21mm/px · 14 of 35 slices shown]
[im 2/35]
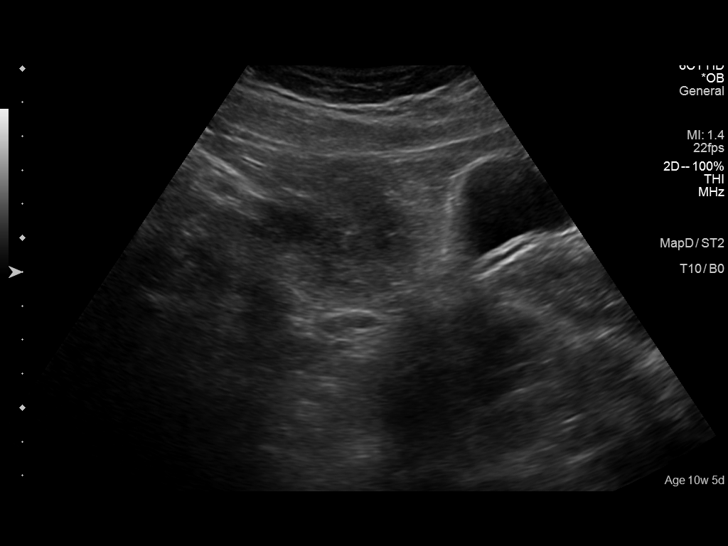
[im 4/35]
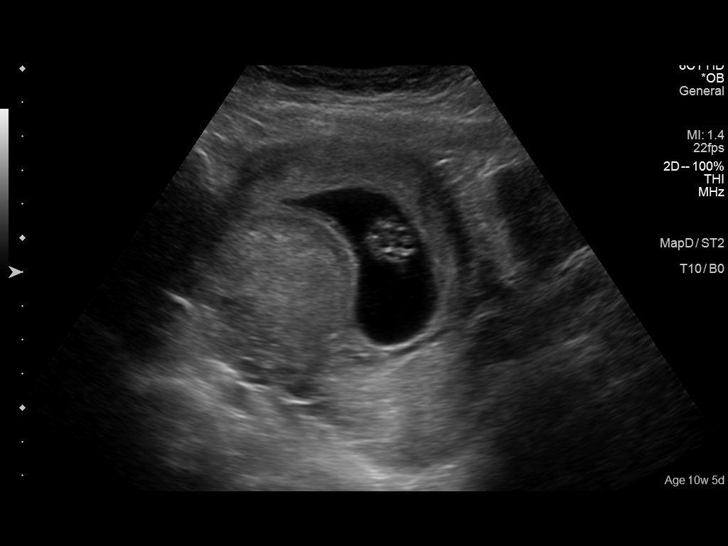
[im 7/35]
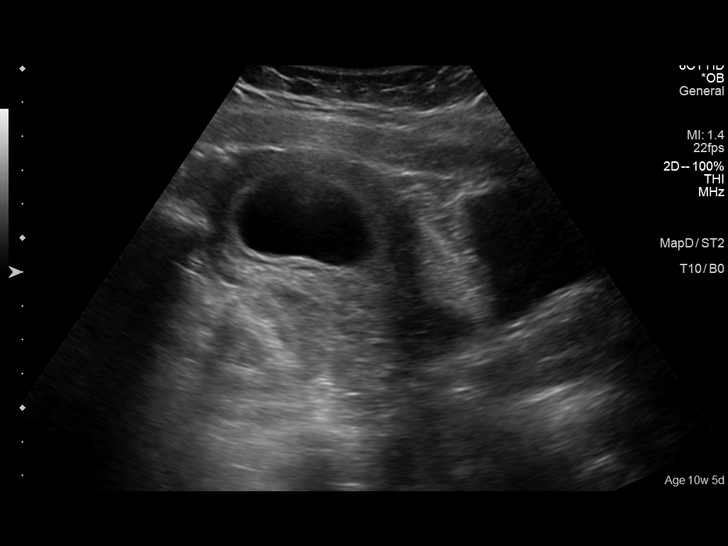
[im 9/35]
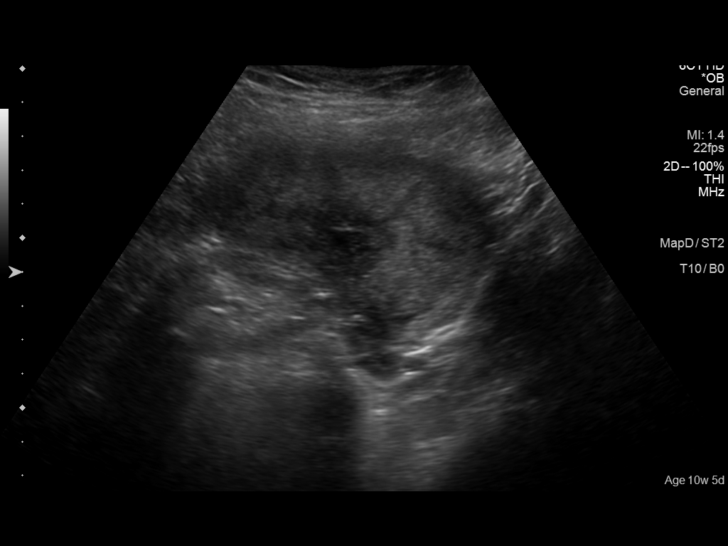
[im 12/35]
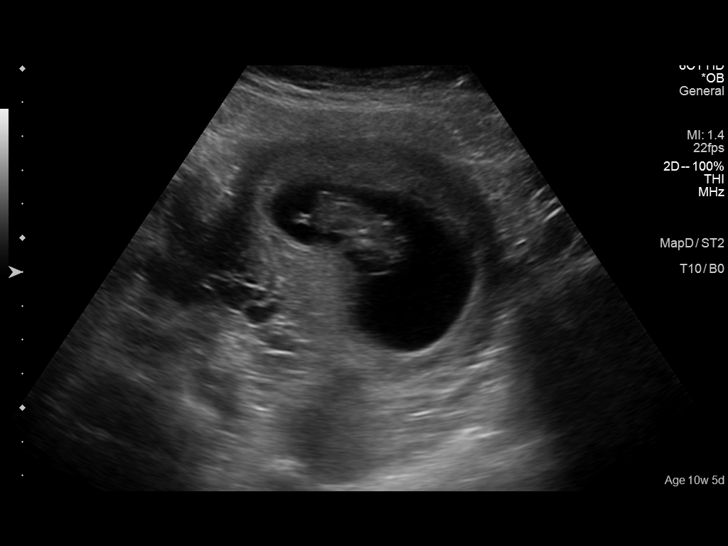
[im 14/35]
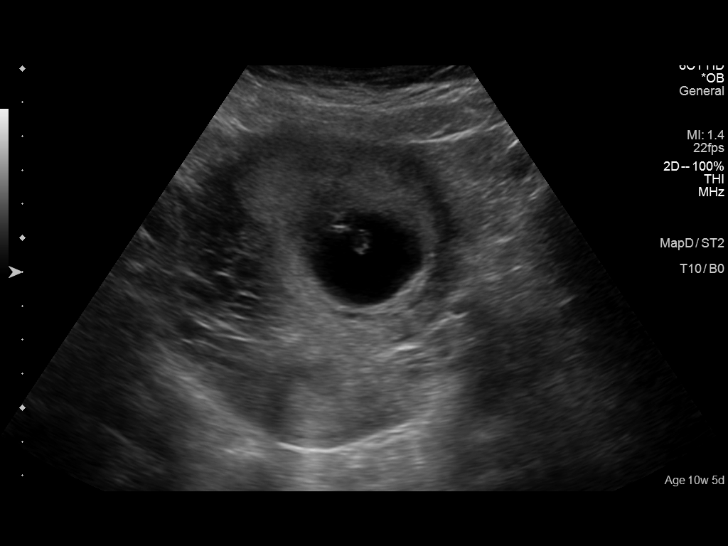
[im 17/35]
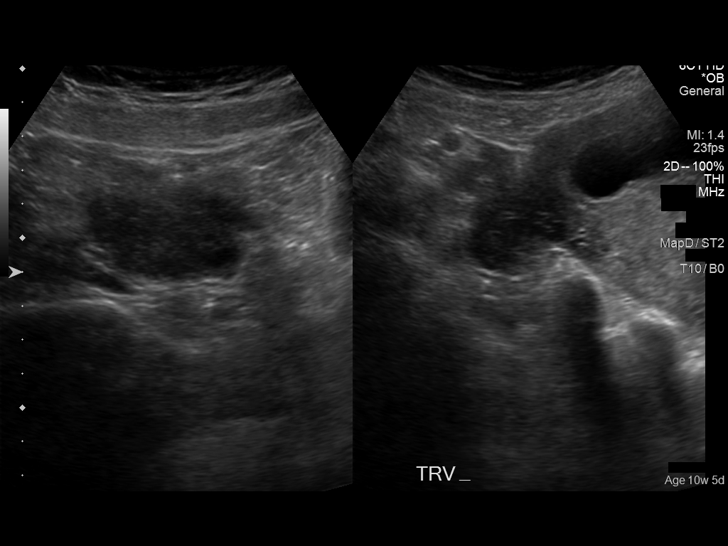
[im 19/35]
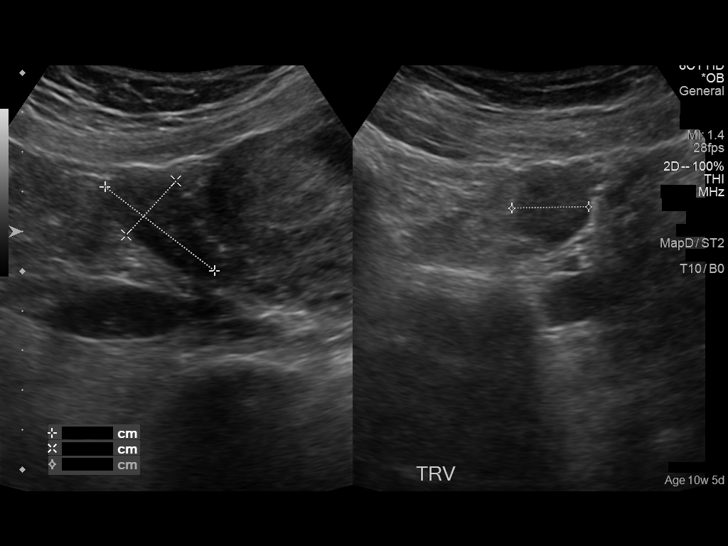
[im 22/35]
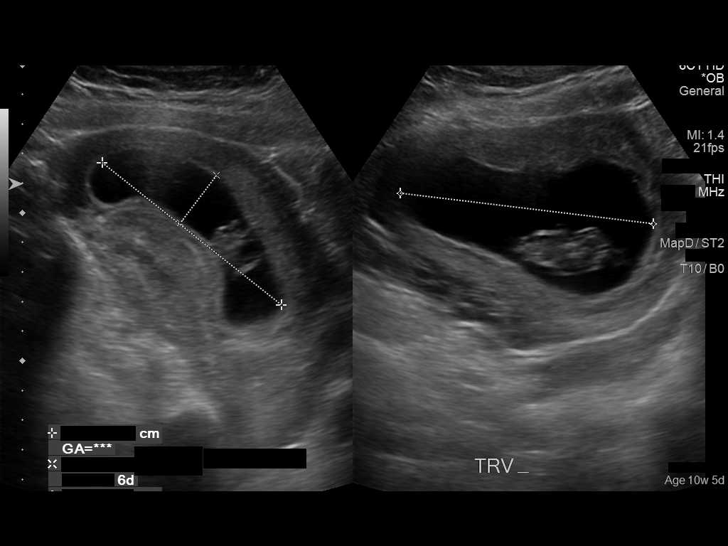
[im 24/35]
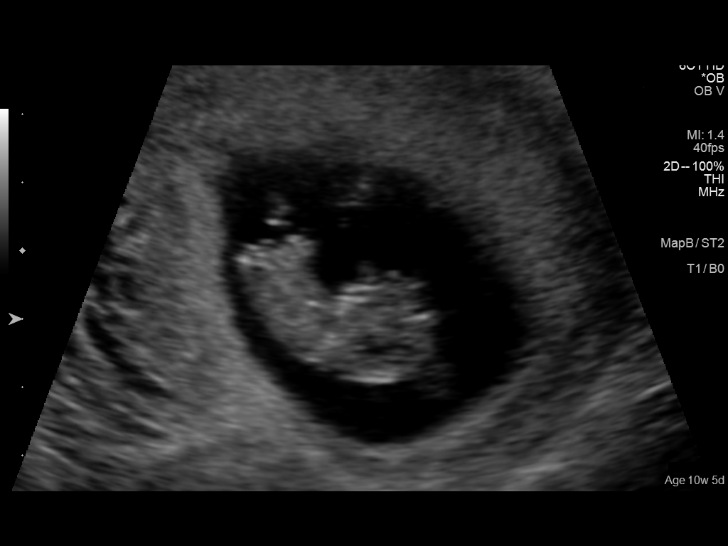
[im 27/35]
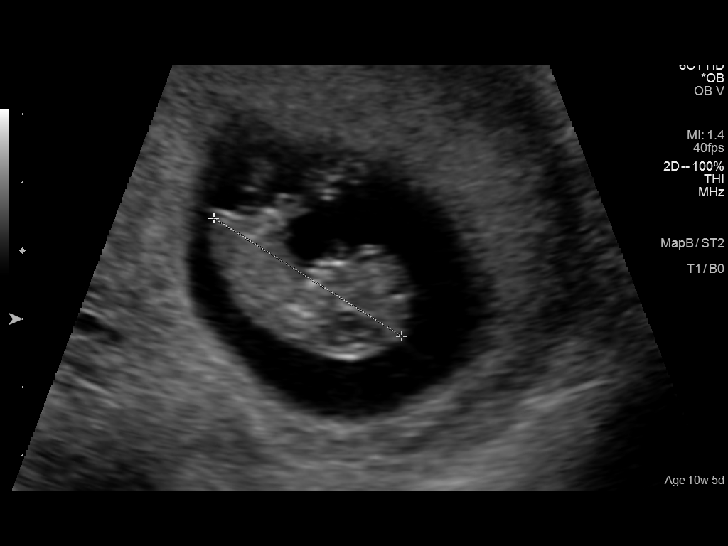
[im 29/35]
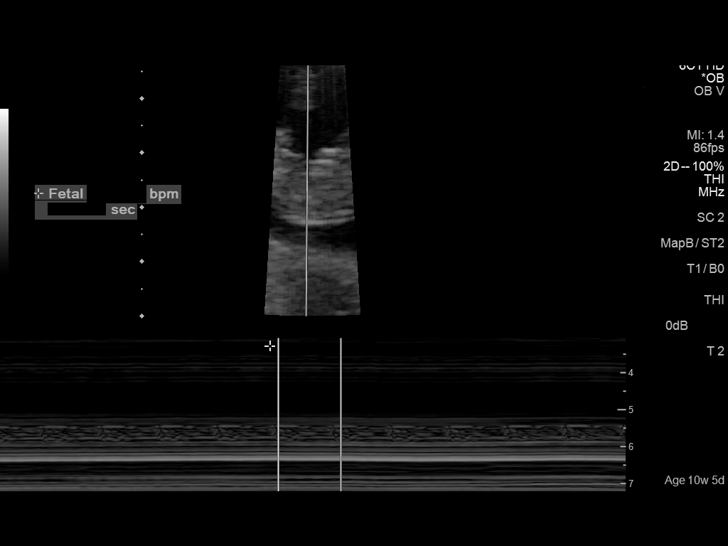
[im 32/35]
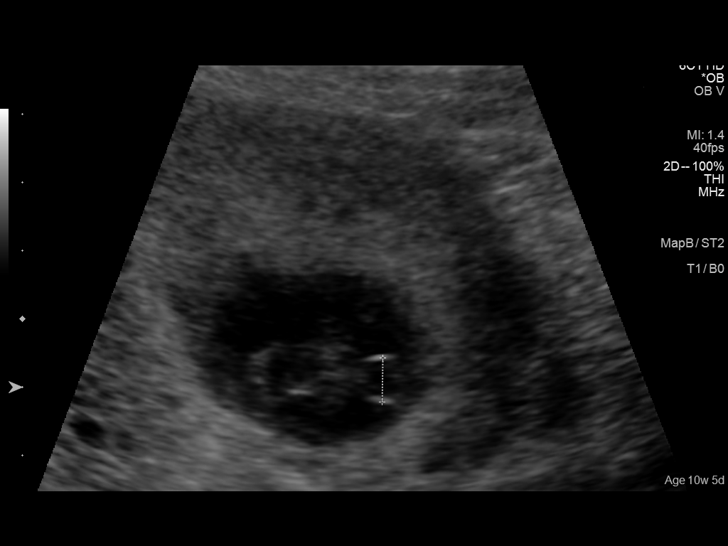
[im 35/35]
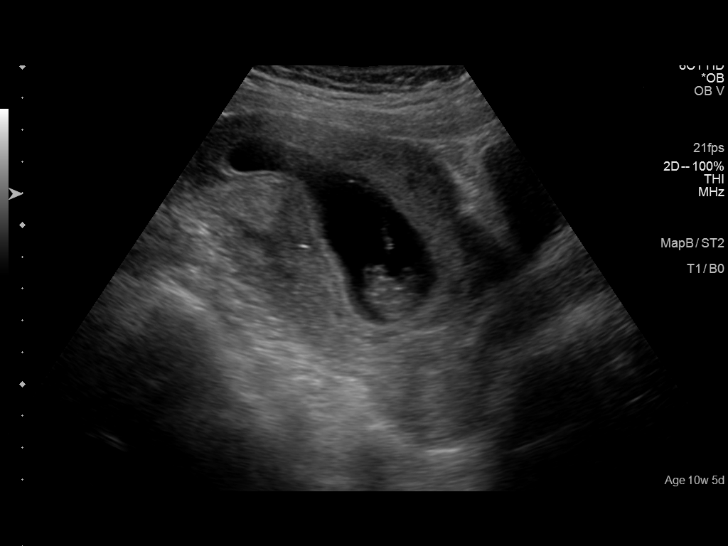

[14 of 28 positions shown; findings below may reference images not displayed]

FINDINGS: Intrauterine gestational sac: Visualized/normal in shape.

Yolk sac:  Visualized

Embryo:  Visualized

Cardiac Activity: Visualized

Heart Rate: 163 bpm

CRL:   3.3  mm   10 w 1 d                  US EDC: 04/16/2015

Maternal uterus/adnexae: Both ovaries are normal in appearance. No
mass or free fluid identified.
IMPRESSION: Single living IUP measuring 10 weeks 1 day with US EDC of
04/16/2015.

No significant maternal uterine or adnexal abnormality identified.

## 2015-03-01 ENCOUNTER — Ambulatory Visit (INDEPENDENT_AMBULATORY_CARE_PROVIDER_SITE_OTHER): Payer: Medicaid Other | Admitting: Family Medicine

## 2015-03-01 VITALS — BP 116/73 | HR 109 | Temp 98.1°F | Wt 184.0 lb

## 2015-03-01 DIAGNOSIS — Z3483 Encounter for supervision of other normal pregnancy, third trimester: Secondary | ICD-10-CM

## 2015-03-01 NOTE — Patient Instructions (Signed)
Great to meet you!  Come back in 2 weeks  Third Trimester of Pregnancy The third trimester is from week 29 through week 42, months 7 through 9. The third trimester is a time when the fetus is growing rapidly. At the end of the ninth month, the fetus is about 20 inches in length and weighs 6-10 pounds.  BODY CHANGES Your body goes through many changes during pregnancy. The changes vary from woman to woman.   Your weight will continue to increase. You can expect to gain 25-35 pounds (11-16 kg) by the end of the pregnancy.  You may begin to get stretch marks on your hips, abdomen, and breasts.  You may urinate more often because the fetus is moving lower into your pelvis and pressing on your bladder.  You may develop or continue to have heartburn as a result of your pregnancy.  You may develop constipation because certain hormones are causing the muscles that push waste through your intestines to slow down.  You may develop hemorrhoids or swollen, bulging veins (varicose veins).  You may have pelvic pain because of the weight gain and pregnancy hormones relaxing your joints between the bones in your pelvis. Backaches may result from overexertion of the muscles supporting your posture.  You may have changes in your hair. These can include thickening of your hair, rapid growth, and changes in texture. Some women also have hair loss during or after pregnancy, or hair that feels dry or thin. Your hair will most likely return to normal after your baby is born.  Your breasts will continue to grow and be tender. A yellow discharge may leak from your breasts called colostrum.  Your belly button may stick out.  You may feel short of breath because of your expanding uterus.  You may notice the fetus "dropping," or moving lower in your abdomen.  You may have a bloody mucus discharge. This usually occurs a few days to a week before labor begins.  Your cervix becomes thin and soft (effaced) near  your due date. WHAT TO EXPECT AT YOUR PRENATAL EXAMS  You will have prenatal exams every 2 weeks until week 36. Then, you will have weekly prenatal exams. During a routine prenatal visit:  You will be weighed to make sure you and the fetus are growing normally.  Your blood pressure is taken.  Your abdomen will be measured to track your baby's growth.  The fetal heartbeat will be listened to.  Any test results from the previous visit will be discussed.  You may have a cervical check near your due date to see if you have effaced. At around 36 weeks, your caregiver will check your cervix. At the same time, your caregiver will also perform a test on the secretions of the vaginal tissue. This test is to determine if a type of bacteria, Group B streptococcus, is present. Your caregiver will explain this further. Your caregiver may ask you:  What your birth plan is.  How you are feeling.  If you are feeling the baby move.  If you have had any abnormal symptoms, such as leaking fluid, bleeding, severe headaches, or abdominal cramping.  If you have any questions. Other tests or screenings that may be performed during your third trimester include:  Blood tests that check for low iron levels (anemia).  Fetal testing to check the health, activity level, and growth of the fetus. Testing is done if you have certain medical conditions or if there are problems during  the pregnancy. FALSE LABOR You may feel small, irregular contractions that eventually go away. These are called Braxton Hicks contractions, or false labor. Contractions may last for hours, days, or even weeks before true labor sets in. If contractions come at regular intervals, intensify, or become painful, it is best to be seen by your caregiver.  SIGNS OF LABOR   Menstrual-like cramps.  Contractions that are 5 minutes apart or less.  Contractions that start on the top of the uterus and spread down to the lower abdomen and  back.  A sense of increased pelvic pressure or back pain.  A watery or bloody mucus discharge that comes from the vagina. If you have any of these signs before the 37th week of pregnancy, call your caregiver right away. You need to go to the hospital to get checked immediately. HOME CARE INSTRUCTIONS   Avoid all smoking, herbs, alcohol, and unprescribed drugs. These chemicals affect the formation and growth of the baby.  Follow your caregiver's instructions regarding medicine use. There are medicines that are either safe or unsafe to take during pregnancy.  Exercise only as directed by your caregiver. Experiencing uterine cramps is a good sign to stop exercising.  Continue to eat regular, healthy meals.  Wear a good support bra for breast tenderness.  Do not use hot tubs, steam rooms, or saunas.  Wear your seat belt at all times when driving.  Avoid raw meat, uncooked cheese, cat litter boxes, and soil used by cats. These carry germs that can cause birth defects in the baby.  Take your prenatal vitamins.  Try taking a stool softener (if your caregiver approves) if you develop constipation. Eat more high-fiber foods, such as fresh vegetables or fruit and whole grains. Drink plenty of fluids to keep your urine clear or pale yellow.  Take warm sitz baths to soothe any pain or discomfort caused by hemorrhoids. Use hemorrhoid cream if your caregiver approves.  If you develop varicose veins, wear support hose. Elevate your feet for 15 minutes, 3-4 times a day. Limit salt in your diet.  Avoid heavy lifting, wear low heal shoes, and practice good posture.  Rest a lot with your legs elevated if you have leg cramps or low back pain.  Visit your dentist if you have not gone during your pregnancy. Use a soft toothbrush to brush your teeth and be gentle when you floss.  A sexual relationship may be continued unless your caregiver directs you otherwise.  Do not travel far distances unless  it is absolutely necessary and only with the approval of your caregiver.  Take prenatal classes to understand, practice, and ask questions about the labor and delivery.  Make a trial run to the hospital.  Pack your hospital bag.  Prepare the baby's nursery.  Continue to go to all your prenatal visits as directed by your caregiver. SEEK MEDICAL CARE IF:  You are unsure if you are in labor or if your water has broken.  You have dizziness.  You have mild pelvic cramps, pelvic pressure, or nagging pain in your abdominal area.  You have persistent nausea, vomiting, or diarrhea.  You have a bad smelling vaginal discharge.  You have pain with urination. SEEK IMMEDIATE MEDICAL CARE IF:   You have a fever.  You are leaking fluid from your vagina.  You have spotting or bleeding from your vagina.  You have severe abdominal cramping or pain.  You have rapid weight loss or gain.  You have shortness of  breath with chest pain.  You notice sudden or extreme swelling of your face, hands, ankles, feet, or legs.  You have not felt your baby move in over an hour.  You have severe headaches that do not go away with medicine.  You have vision changes. Document Released: 09/02/2001 Document Revised: 09/13/2013 Document Reviewed: 11/09/2012 [facility] Patient Information 2015 [place], [place]. This information is not intended to replace advice given to you by your health care provider. Make sure you discuss any questions you have with your health care provider.

## 2015-03-01 NOTE — Progress Notes (Signed)
Marissa Contreras is a 24 y.o. G5P1031 at [redacted]w[redacted]d for routine follow up.  She reports no problems, desires epidural.  See flow sheet for details.  A/P: Pregnancy at [redacted]w[redacted]d.  Doing well.   Pregnancy issues include protein in previous UA but resolved on repeat, no HTN  Infant feeding choicebreast Contraception choiceundecided Infant circumcision desired not applicable   Preterm labor precautions reviewed. Safe sleep discussed. Follow up 2 weeks.

## 2015-03-13 ENCOUNTER — Inpatient Hospital Stay (HOSPITAL_COMMUNITY)
Admission: AD | Admit: 2015-03-13 | Discharge: 2015-03-13 | Disposition: A | Discharge status: Home / Self Care | Payer: Medicaid Other | Source: Ambulatory Visit | Attending: Family Medicine | Admitting: Family Medicine

## 2015-03-13 ENCOUNTER — Encounter (HOSPITAL_COMMUNITY): Payer: Self-pay | Admitting: *Deleted

## 2015-03-13 DIAGNOSIS — Z87891 Personal history of nicotine dependence: Secondary | ICD-10-CM | POA: Diagnosis not present

## 2015-03-13 DIAGNOSIS — O368131 Decreased fetal movements, third trimester, fetus 1: Secondary | ICD-10-CM

## 2015-03-13 DIAGNOSIS — O368130 Decreased fetal movements, third trimester, not applicable or unspecified: Secondary | ICD-10-CM | POA: Insufficient documentation

## 2015-03-13 DIAGNOSIS — O4703 False labor before 37 completed weeks of gestation, third trimester: Secondary | ICD-10-CM

## 2015-03-13 DIAGNOSIS — Z3A35 35 weeks gestation of pregnancy: Secondary | ICD-10-CM | POA: Insufficient documentation

## 2015-03-13 LAB — URINALYSIS, ROUTINE W REFLEX MICROSCOPIC
Bilirubin Urine: NEGATIVE
Glucose, UA: NEGATIVE mg/dL
HGB URINE DIPSTICK: NEGATIVE
Ketones, ur: NEGATIVE mg/dL
Leukocytes, UA: NEGATIVE
Nitrite: NEGATIVE
PROTEIN: NEGATIVE mg/dL
Specific Gravity, Urine: 1.025 (ref 1.005–1.030)
Urobilinogen, UA: 4 mg/dL — ABNORMAL HIGH (ref 0.0–1.0)
pH: 6.5 (ref 5.0–8.0)

## 2015-03-13 NOTE — MAU Provider Note (Signed)
Chief Complaint:  Decreased Fetal Movement   First Provider Initiated Contact with Patient 03/13/15 1706     HPI: Marissa Contreras is a 24 y.o. G5P1031 at [redacted]w[redacted]d who presents to maternity admissions reporting crease fetal movement times a week and pelvic pain and pressure. Hasn't had problems with preterm labor this pregnancy. Has not had a cervical exam recently. Thinks she may be having contractions. Denies vaginal bleeding or leaking of fluid. Gets prenatal care at [facility].  Past Medical History: Past Medical History  Diagnosis Date  . H/O varicella   . Back pain   . Chronic back pain   . Asthma     Past obstetric history: OB History  Gravida Para Term Preterm AB SAB TAB Ectopic Multiple Living  5 1 1  3 3    1     # Outcome Date GA Lbr Len/2nd Weight Sex Delivery Anes PTL Lv  5 Current           4 SAB 2015          3 Term 01/11/13    Genella Mech EPI N Y  2 SAB 2012          1 SAB 2011              Past Surgical History: Past Surgical History  Procedure Laterality Date  . Tonsillectomy    . Dilation and evacuation N/A 03/08/2014    Procedure: DILATATION AND EVACUATION ;  Surgeon: Purcell Nails, MD;  Location: [facility];  Service: Gynecology;  Laterality: N/A;  with chromosomal studies, sent      Family History: Family History  Problem Relation Age of Onset  . Hypertension Father   . Diabetes Father   . Fibromyalgia Mother   . Arthritis Mother   . Depression Mother   . Diabetes Paternal Grandmother   . Cancer Paternal Grandmother   . Stroke Paternal Grandmother     Social History: History  Substance Use Topics  . Smoking status: Former Smoker    Types: Cigarettes    Quit date: 11/21/2011  . Smokeless tobacco: Never Used     Comment: e-cig  . Alcohol Use: No    Allergies: No Known Allergies  Meds:  No prescriptions prior to admission    ROS:  Review of Systems  Constitutional: Negative for fever and chills.  Gastrointestinal: Positive  for abdominal pain.  Genitourinary: Negative for dysuria, urgency, frequency, hematuria and flank pain.       Positive for contractions and pressure on her cervix. Negative for vaginal bleeding or leaking of fluid.    Physical Exam  Blood pressure 134/83, pulse 118, temperature 98.1 F (36.7 C), temperature source Oral, resp. rate 20, height 5\' 4"  (1.626 m), weight 187 lb (84.823 kg), last menstrual period 07/06/2014. GENERAL: Well-developed, well-nourished female in mild distress.  HEART: normal rate RESP: normal effort GI: Abd soft, non-tender, gravid appropriate for gestational age.  MS: Extremities nontender, no edema, normal ROM NEURO: Alert and oriented x 4.  GU: NEFG, physiologic discharge, no blood. Dilation: 2 Effacement (%): 60 Cervical Position: Posterior Station: -3 Presentation: Vertex Exam by:: Ivonne Andrew, CNM  FHT:  Baseline 135 , moderate variability, accelerations present, no decelerations Contractions: irreg, mild   Labs: Results for orders placed or performed during the hospital encounter of 03/13/15 (from the past 24 hour(s))  Urinalysis, Routine w reflex microscopic (not at [facility])     Status: Abnormal   Collection Time: 03/13/15  3:35  PM  Result Value Ref Range   Color, Urine AMBER (A) YELLOW   APPearance CLEAR CLEAR   Specific Gravity, Urine 1.025 1.005 - 1.030   pH 6.5 5.0 - 8.0   Glucose, UA NEGATIVE NEGATIVE mg/dL   Hgb urine dipstick NEGATIVE NEGATIVE   Bilirubin Urine NEGATIVE NEGATIVE   Ketones, ur NEGATIVE NEGATIVE mg/dL   Protein, ur NEGATIVE NEGATIVE mg/dL   Urobilinogen, UA 4.0 (H) 0.0 - 1.0 mg/dL   Nitrite NEGATIVE NEGATIVE   Leukocytes, UA NEGATIVE NEGATIVE    Imaging:  No results found.  MAU Course: UA, NST, Push fluids.  No cervical change while in MAU. Baby active.   Assessment: 1. Preterm contractions, third trimester   2. Decreased fetal movement, third trimester, fetus 1     Plan: Discharge home in stable condition.   Preterm labor precautions and fetal kick counts       Medication List    ASK your doctor about these medications        acetaminophen 500 MG tablet  Commonly known as:  TYLENOL  Take 1,000 mg by mouth every 6 (six) hours as needed for mild pain, moderate pain or headache.     albuterol 108 (90 BASE) MCG/ACT inhaler  Commonly known as:  PROVENTIL HFA;VENTOLIN HFA  Inhale 2 puffs into the lungs every 6 (six) hours as needed for wheezing or shortness of breath.     calcium carbonate 500 MG chewable tablet  Commonly known as:  TUMS  Chew 1 tablet (200 mg of elemental calcium total) by mouth daily.     cyclobenzaprine 5 MG tablet  Commonly known as:  FLEXERIL  Take 5 mg by mouth 3 (three) times daily as needed for muscle spasms.     prenatal multivitamin Tabs tablet  Take 1 tablet by mouth daily at 12 noon.        [place], [place] 03/13/2015 11:53 PM

## 2015-03-13 NOTE — MAU Note (Signed)
Pt presents to MAU with complaints of a decrease in fetal movement for a week  With  "pain in her cervix". Pt denies any vaginal bleeding or LOF

## 2015-03-13 NOTE — Progress Notes (Signed)
Pamelia Hoit NP in to see pt

## 2015-03-13 NOTE — Progress Notes (Signed)
Dr Doroteo Glassman notified of pt's admission and status. Reactive FHR and no ctxs. Will see pt

## 2015-03-13 NOTE — Discharge Instructions (Signed)
Fetal Movement Counts °Patient Name: __________________________________________________ Patient Due Date: ____________________ °Performing a fetal movement count is highly recommended in high-risk pregnancies, but it is good for every pregnant woman to do. Your health care provider may ask you to start counting fetal movements at 28 weeks of the pregnancy. Fetal movements often increase: °· After eating a full meal. °· After physical activity. °· After eating or drinking something sweet or cold. °· At rest. °Pay attention to when you feel the baby is most active. This will help you notice a pattern of your baby's sleep and wake cycles and what factors contribute to an increase in fetal movement. It is important to perform a fetal movement count at the same time each day when your baby is normally most active.  °HOW TO COUNT FETAL MOVEMENTS °1. Find a quiet and comfortable area to sit or lie down on your left side. Lying on your left side provides the best blood and oxygen circulation to your baby. °2. Write down the day and time on a sheet of paper or in a journal. °3. Start counting kicks, flutters, swishes, rolls, or jabs in a 2-hour period. You should feel at least 10 movements within 2 hours. °4. If you do not feel 10 movements in 2 hours, wait 2-3 hours and count again. Look for a change in the pattern or not enough counts in 2 hours. °SEEK MEDICAL CARE IF: °· You feel less than 10 counts in 2 hours, tried twice. °· There is no movement in over an hour. °· The pattern is changing or taking longer each day to reach 10 counts in 2 hours. °· You feel the baby is not moving as he or she usually does. °Date: ____________ Movements: ____________ Start time: ____________ Finish time: ____________  °Date: ____________ Movements: ____________ Start time: ____________ Finish time: ____________ °Date: ____________ Movements: ____________ Start time: ____________ Finish time: ____________ °Date: ____________ Movements:  ____________ Start time: ____________ Finish time: ____________ °Date: ____________ Movements: ____________ Start time: ____________ Finish time: ____________ °Date: ____________ Movements: ____________ Start time: ____________ Finish time: ____________ °Date: ____________ Movements: ____________ Start time: ____________ Finish time: ____________ °Date: ____________ Movements: ____________ Start time: ____________ Finish time: ____________  °Date: ____________ Movements: ____________ Start time: ____________ Finish time: ____________ °Date: ____________ Movements: ____________ Start time: ____________ Finish time: ____________ °Date: ____________ Movements: ____________ Start time: ____________ Finish time: ____________ °Date: ____________ Movements: ____________ Start time: ____________ Finish time: ____________ °Date: ____________ Movements: ____________ Start time: ____________ Finish time: ____________ °Date: ____________ Movements: ____________ Start time: ____________ Finish time: ____________ °Date: ____________ Movements: ____________ Start time: ____________ Finish time: ____________  °Date: ____________ Movements: ____________ Start time: ____________ Finish time: ____________ °Date: ____________ Movements: ____________ Start time: ____________ Finish time: ____________ °Date: ____________ Movements: ____________ Start time: ____________ Finish time: ____________ °Date: ____________ Movements: ____________ Start time: ____________ Finish time: ____________ °Date: ____________ Movements: ____________ Start time: ____________ Finish time: ____________ °Date: ____________ Movements: ____________ Start time: ____________ Finish time: ____________ °Date: ____________ Movements: ____________ Start time: ____________ Finish time: ____________  °Date: ____________ Movements: ____________ Start time: ____________ Finish time: ____________ °Date: ____________ Movements: ____________ Start time: ____________ Finish  time: ____________ °Date: ____________ Movements: ____________ Start time: ____________ Finish time: ____________ °Date: ____________ Movements: ____________ Start time: ____________ Finish time: ____________ °Date: ____________ Movements: ____________ Start time: ____________ Finish time: ____________ °Date: ____________ Movements: ____________ Start time: ____________ Finish time: ____________ °Date: ____________ Movements: ____________ Start time: ____________ Finish time: ____________  °Date: ____________ Movements: ____________ Start time: ____________ Finish   time: ____________ °Date: ____________ Movements: ____________ Start time: ____________ Finish time: ____________ °Date: ____________ Movements: ____________ Start time: ____________ Finish time: ____________ °Date: ____________ Movements: ____________ Start time: ____________ Finish time: ____________ °Date: ____________ Movements: ____________ Start time: ____________ Finish time: ____________ °Date: ____________ Movements: ____________ Start time: ____________ Finish time: ____________ °Date: ____________ Movements: ____________ Start time: ____________ Finish time: ____________  °Date: ____________ Movements: ____________ Start time: ____________ Finish time: ____________ °Date: ____________ Movements: ____________ Start time: ____________ Finish time: ____________ °Date: ____________ Movements: ____________ Start time: ____________ Finish time: ____________ °Date: ____________ Movements: ____________ Start time: ____________ Finish time: ____________ °Date: ____________ Movements: ____________ Start time: ____________ Finish time: ____________ °Date: ____________ Movements: ____________ Start time: ____________ Finish time: ____________ °Date: ____________ Movements: ____________ Start time: ____________ Finish time: ____________  °Date: ____________ Movements: ____________ Start time: ____________ Finish time: ____________ °Date: ____________  Movements: ____________ Start time: ____________ Finish time: ____________ °Date: ____________ Movements: ____________ Start time: ____________ Finish time: ____________ °Date: ____________ Movements: ____________ Start time: ____________ Finish time: ____________ °Date: ____________ Movements: ____________ Start time: ____________ Finish time: ____________ °Date: ____________ Movements: ____________ Start time: ____________ Finish time: ____________ °Date: ____________ Movements: ____________ Start time: ____________ Finish time: ____________  °Date: ____________ Movements: ____________ Start time: ____________ Finish time: ____________ °Date: ____________ Movements: ____________ Start time: ____________ Finish time: ____________ °Date: ____________ Movements: ____________ Start time: ____________ Finish time: ____________ °Date: ____________ Movements: ____________ Start time: ____________ Finish time: ____________ °Date: ____________ Movements: ____________ Start time: ____________ Finish time: ____________ °Date: ____________ Movements: ____________ Start time: ____________ Finish time: ____________ °Document Released: 10/08/2006 Document Revised: 01/23/2014 Document Reviewed: 07/05/2012 °ExitCare® Patient Information ©2015 [employer]. This information is not intended to replace advice given to you by your health care provider. Make sure you discuss any questions you have with your health care provider. °Braxton Hicks Contractions °Contractions of the uterus can occur throughout pregnancy. Contractions are not always a sign that you are in labor.  °WHAT ARE BRAXTON HICKS CONTRACTIONS?  °Contractions that occur before labor are called Braxton Hicks contractions, or false labor. Toward the end of pregnancy (32-34 weeks), these contractions can develop more often and may become more forceful. This is not true labor because these contractions do not result in opening (dilatation) and thinning of the cervix. They  are sometimes difficult to tell apart from true labor because these contractions can be forceful and people have different pain tolerances. You should not feel embarrassed if you go to the hospital with false labor. Sometimes, the only way to tell if you are in true labor is for your health care provider to look for changes in the cervix. °If there are no prenatal problems or other health problems associated with the pregnancy, it is completely safe to be sent home with false labor and await the onset of true labor. °HOW CAN YOU TELL THE DIFFERENCE BETWEEN TRUE AND FALSE LABOR? °False Labor °· The contractions of false labor are usually shorter and not as hard as those of true labor.   °· The contractions are usually irregular.   °· The contractions are often felt in the front of the lower abdomen and in the groin.   °· The contractions may go away when you walk around or change positions while lying down.   °· The contractions get weaker and are shorter lasting as time goes on.   °· The contractions do not usually become progressively stronger, regular, and closer together as with true labor.   °True Labor °5. Contractions in true labor last 30-70 seconds, become   very regular, usually become more intense, and increase in frequency.   °6. The contractions do not go away with walking.   °7. The discomfort is usually felt in the top of the uterus and spreads to the lower abdomen and low back.   °8. True labor can be determined by your health care provider with an exam. This will show that the cervix is dilating and getting thinner.   °WHAT TO REMEMBER °· Keep up with your usual exercises and follow other instructions given by your health care provider.   °· Take medicines as directed by your health care provider.   °· Keep your regular prenatal appointments.   °· Eat and drink lightly if you think you are going into labor.   °· If Braxton Hicks contractions are making you uncomfortable:   °· Change your position from  lying down or resting to walking, or from walking to resting.   °· Sit and rest in a tub of warm water.   °· Drink 2-3 glasses of water. Dehydration may cause these contractions.   °· Do slow and deep breathing several times an hour.   °WHEN SHOULD I SEEK IMMEDIATE MEDICAL CARE? °Seek immediate medical care if: °· Your contractions become stronger, more regular, and closer together.   °· You have fluid leaking or gushing from your vagina.   °· You have a fever.   °· You pass blood-tinged mucus.   °· You have vaginal bleeding.   °· You have continuous abdominal pain.   °· You have low back pain that you never had before.   °· You feel your baby's head pushing down and causing pelvic pressure.   °· Your baby is not moving as much as it used to.   °Document Released: 09/08/2005 Document Revised: 09/13/2013 Document Reviewed: 06/20/2013 °ExitCare® Patient Information ©2015 [employer]. This information is not intended to replace advice given to you by your health care provider. Make sure you discuss any questions you have with your health care provider. ° °

## 2015-03-14 ENCOUNTER — Encounter (HOSPITAL_COMMUNITY): Payer: Self-pay | Admitting: *Deleted

## 2015-03-14 ENCOUNTER — Inpatient Hospital Stay (HOSPITAL_COMMUNITY)
Admission: AD | Admit: 2015-03-14 | Discharge: 2015-03-14 | Disposition: A | Discharge status: Home / Self Care | Payer: Medicaid Other | Source: Ambulatory Visit | Attending: Obstetrics & Gynecology | Admitting: Obstetrics & Gynecology

## 2015-03-14 DIAGNOSIS — Z87891 Personal history of nicotine dependence: Secondary | ICD-10-CM | POA: Diagnosis not present

## 2015-03-14 DIAGNOSIS — Z3A35 35 weeks gestation of pregnancy: Secondary | ICD-10-CM | POA: Insufficient documentation

## 2015-03-14 LAB — URINALYSIS, ROUTINE W REFLEX MICROSCOPIC
Bilirubin Urine: NEGATIVE
Glucose, UA: NEGATIVE mg/dL
HGB URINE DIPSTICK: NEGATIVE
Ketones, ur: 15 mg/dL — AB
LEUKOCYTES UA: NEGATIVE
NITRITE: NEGATIVE
PROTEIN: NEGATIVE mg/dL
SPECIFIC GRAVITY, URINE: 1.02 (ref 1.005–1.030)
UROBILINOGEN UA: 2 mg/dL — AB (ref 0.0–1.0)
pH: 7 (ref 5.0–8.0)

## 2015-03-14 NOTE — MAU Note (Signed)
Pt seen in MAU yesterday for PTL eval, was sent home.  Pt states uc's have continued through the night & she now has back, hip & leg pain also.  Denies bleeding or LOF.  Reports good FM.

## 2015-03-14 NOTE — MAU Provider Note (Signed)
History   CSN: [id number]  Arrival date and time: 03/14/15 5396  Chief Complaint  Patient presents with  . Laboring   HPI Patient is 24 y.o. [id number] [redacted]w[redacted]d here with complaints of back, hip and leg pain with contractions. She believes that she is laboring. Patient was seen in the MAU yesterday for similar symptoms and was told to return if worsened. Patient believes symptoms worse today. She states contractions started up again last night around 11pm and were every 3 minutes up until early this morning. She rates her pain as 6-7. She has tried tylenol and flexeril without relief.   +FM, + contractions, denies LOF, VB, vaginal discharge.   OB History    Gravida Para Term Preterm AB TAB SAB Ectopic Multiple Living   5 1 1  3  3   1     -Prenatal care at [facility]  Past Medical History  Diagnosis Date  . H/O varicella   . Back pain   . Chronic back pain   . Asthma     Past Surgical History  Procedure Laterality Date  . Tonsillectomy    . Dilation and evacuation N/A 03/08/2014    Procedure: DILATATION AND EVACUATION ;  Surgeon: Purcell Nails, MD;  Location: [facility];  Service: Gynecology;  Laterality: N/A;  with chromosomal studies, sent     Family History  Problem Relation Age of Onset  . Hypertension Father   . Diabetes Father   . Fibromyalgia Mother   . Arthritis Mother   . Depression Mother   . Diabetes Paternal Grandmother   . Cancer Paternal Grandmother   . Stroke Paternal Grandmother     History  Substance Use Topics  . Smoking status: Former Smoker    Types: Cigarettes    Quit date: 11/21/2011  . Smokeless tobacco: Never Used     Comment: e-cig  . Alcohol Use: No    Allergies: No Known Allergies  Prescriptions prior to admission  Medication Sig Dispense Refill Last Dose  . acetaminophen (TYLENOL) 500 MG tablet Take 1,000 mg by mouth every 6 (six) hours as needed for mild pain, moderate pain or headache.    Past Month at Unknown time  . albuterol (PROVENTIL  HFA;[name] 108 (90 BASE) MCG/ACT inhaler Inhale 2 puffs into the lungs every 6 (six) hours as needed for wheezing or shortness of breath.   rescue  . calcium carbonate (TUMS) 500 MG chewable tablet Chew 1 tablet (200 mg of elemental calcium total) by mouth daily. 30 tablet 0   . cyclobenzaprine (FLEXERIL) 5 MG tablet Take 5 mg by mouth 3 (three) times daily as needed for muscle spasms.   Past Week at Unknown time  . Prenatal Vit-Fe Fumarate-FA (PRENATAL MULTIVITAMIN) TABS tablet Take 1 tablet by mouth daily at 12 noon.   Past Month at Unknown time    Review of Systems  Constitutional: Negative for fever and chills.  Eyes: Negative for blurred vision and double vision.  Respiratory: Negative for shortness of breath.   Cardiovascular: Negative for chest pain.  Gastrointestinal: Positive for abdominal pain. Negative for nausea and vomiting.  Musculoskeletal: Positive for back pain and joint pain. Negative for falls.  Neurological: Negative for headaches.  Also per HPI  Physical Exam   Blood pressure 125/74, pulse 137, temperature 98.3 F (36.8 C), temperature source Oral, resp. rate 18, last menstrual period 07/06/2014.  Physical Exam  Constitutional: She is oriented to person, place, and time. She appears well-developed and well-nourished. No  distress.  HENT:  Head: Normocephalic and atraumatic.  Eyes: EOM are normal.  Cardiovascular: Normal rate, regular rhythm, normal heart sounds and intact distal pulses.   Respiratory: Effort normal and breath sounds normal.  GI: Soft. Bowel sounds are normal. There is no tenderness.  Musculoskeletal: Normal range of motion. She exhibits tenderness. She exhibits no edema.  Neurological: She is alert and oriented to person, place, and time.  Skin: Skin is warm and dry.  Dilation: 2.5 Effacement (%): 60 Cervical Position: Middle Station: -2 Presentation: Vertex Exam by:: Sarajane Marek, RNC  Results for orders placed or performed during the  hospital encounter of 03/14/15 (from the past 24 hour(s))  Urinalysis, Routine w reflex microscopic (not at [facility])     Status: Abnormal   Collection Time: 03/14/15  8:25 AM  Result Value Ref Range   Color, Urine YELLOW YELLOW   APPearance CLEAR CLEAR   Specific Gravity, Urine 1.020 1.005 - 1.030   pH 7.0 5.0 - 8.0   Glucose, UA NEGATIVE NEGATIVE mg/dL   Hgb urine dipstick NEGATIVE NEGATIVE   Bilirubin Urine NEGATIVE NEGATIVE   Ketones, ur 15 (A) NEGATIVE mg/dL   Protein, ur NEGATIVE NEGATIVE mg/dL   Urobilinogen, UA 2.0 (H) 0.0 - 1.0 mg/dL   Nitrite NEGATIVE NEGATIVE   Leukocytes, UA NEGATIVE NEGATIVE     MAU Course  Procedures - None  MDM: - Baby reactive and no contractions on toco - SVE x2 1 hr apart without change -UA  Assessment and Plan  A: Patient is 24 y.o. [id number] [redacted]w[redacted]d reporting pains and contractions. Patient with h/o chronic back pain. MSK complaints likely chronic but also precipitated by pregnancy. No signs of PTL and no contractions on monitor today.   P: Discharge home - Reviewed findings and my conclusion - Encouraged patient to continue using conservative treatments for pain - fetal kick counts reinforced - preterm labor precautions dicussed - Follow-up with OB provider  Caryl Ada, DO 03/14/2015, 4:38 PM PGY-1, [facility]

## 2015-03-14 NOTE — Discharge Instructions (Signed)
Keep scheduled appt for prenatal care. Drink 8-10 glasses of water per day. Call office for further concerns or return to MAU as needed. Try warm bath or shower, ice or heat (no higher than medium) to back.

## 2015-03-16 ENCOUNTER — Ambulatory Visit (INDEPENDENT_AMBULATORY_CARE_PROVIDER_SITE_OTHER): Payer: Medicaid Other | Admitting: Family Medicine

## 2015-03-16 ENCOUNTER — Other Ambulatory Visit (HOSPITAL_COMMUNITY)
Admission: RE | Admit: 2015-03-16 | Discharge: 2015-03-16 | Disposition: A | Discharge status: Home / Self Care | Payer: Medicaid Other | Source: Ambulatory Visit | Attending: Podiatry | Admitting: Podiatry

## 2015-03-16 VITALS — BP 111/63 | HR 74 | Temp 98.3°F | Wt 184.9 lb

## 2015-03-16 DIAGNOSIS — Z113 Encounter for screening for infections with a predominantly sexual mode of transmission: Secondary | ICD-10-CM | POA: Diagnosis not present

## 2015-03-16 DIAGNOSIS — Z3483 Encounter for supervision of other normal pregnancy, third trimester: Secondary | ICD-10-CM

## 2015-03-16 NOTE — Progress Notes (Signed)
Marissa Contreras is a 25 y.o. [id number] at [redacted]w[redacted]d for routine follow up.  She reports hip, back, and leg pain. Some leg numbness bilaterally, L>R. No bowel/bladder incontinence.   See flow sheet for details.  A/P: Pregnancy at [redacted]w[redacted]d.  Doing well.   Pregnancy issues include some nausea/vomiting. Back pain and numbness.  Infant feeding choice breastfeeding Contraception choice Implanon. Infant circumcision desired not applicable  Tdapwas not given today. GBS/GC/CZ testing was performed today. Cervical changes noted on exam--likely normal gestational changes. May warrant further f/u after delivery.  Preterm labor precautions reviewed. Safe sleep discussed. Kick counts reviewed. Follow up 2 weeks.

## 2015-03-16 NOTE — Patient Instructions (Signed)
Third Trimester of Pregnancy The third trimester is from week 29 through week 42, months 7 through 9. This trimester is when your unborn baby (fetus) is growing very fast. At the end of the ninth month, the unborn baby is about 20 inches in length. It weighs about 6-10 pounds.  HOME CARE   Avoid all smoking, herbs, and alcohol. Avoid drugs not approved by your doctor.  Only take medicine as told by your doctor. Some medicines are safe and some are not during pregnancy.  Exercise only as told by your doctor. Stop exercising if you start having cramps.  Eat regular, healthy meals.  Wear a good support bra if your breasts are tender.  Do not use hot tubs, steam rooms, or saunas.  Wear your seat belt when driving.  Avoid raw meat, uncooked cheese, and liter boxes and soil used by cats.  Take your prenatal vitamins.  Try taking medicine that helps you poop (stool softener) as needed, and if your doctor approves. Eat more fiber by eating fresh fruit, vegetables, and whole grains. Drink enough fluids to keep your pee (urine) clear or pale yellow.  Take warm water baths (sitz baths) to soothe pain or discomfort caused by hemorrhoids. Use hemorrhoid cream if your doctor approves.  If you have puffy, bulging veins (varicose veins), wear support hose. Raise (elevate) your feet for 15 minutes, 3-4 times a day. Limit salt in your diet.  Avoid heavy lifting, wear low heels, and sit up straight.  Rest with your legs raised if you have leg cramps or low back pain.  Visit your dentist if you have not gone during your pregnancy. Use a soft toothbrush to brush your teeth. Be gentle when you floss.  You can have sex (intercourse) unless your doctor tells you not to.  Do not travel far distances unless you must. Only do so with your doctor's approval.  Take prenatal classes.  Practice driving to the hospital.  Pack your hospital bag.  Prepare the baby's room.  Go to your doctor visits. GET  HELP IF:  You are not sure if you are in labor or if your water has broken.  You are dizzy.  You have mild cramps or pressure in your lower belly (abdominal).  You have a nagging pain in your belly area.  You continue to feel sick to your stomach (nauseous), throw up (vomit), or have watery poop (diarrhea).  You have bad smelling fluid coming from your vagina.  You have pain with peeing (urination). GET HELP RIGHT AWAY IF:   You have a fever.  You are leaking fluid from your vagina.  You are spotting or bleeding from your vagina.  You have severe belly cramping or pain.  You lose or gain weight rapidly.  You have trouble catching your breath and have chest pain.  You notice sudden or extreme puffiness (swelling) of your face, hands, ankles, feet, or legs.  You have not felt the baby move in over an hour.  You have severe headaches that do not go away with medicine.  You have vision changes. Document Released: 12/03/2009 Document Revised: 01/03/2013 Document Reviewed: 11/09/2012 ExitCare Patient Information 2015 [employer]. This information is not intended to replace advice given to you by your health care provider. Make sure you discuss any questions you have with your health care provider.  

## 2015-03-17 LAB — STREP B DNA PROBE: GBSP: DETECTED

## 2015-03-19 LAB — CERVICOVAGINAL ANCILLARY ONLY
Chlamydia: NEGATIVE
Neisseria Gonorrhea: NEGATIVE

## 2015-03-23 ENCOUNTER — Inpatient Hospital Stay (HOSPITAL_COMMUNITY)
Admission: AD | Admit: 2015-03-23 | Discharge: 2015-03-23 | Disposition: A | Discharge status: Home / Self Care | Payer: Medicaid Other | Source: Ambulatory Visit | Attending: Obstetrics & Gynecology | Admitting: Obstetrics & Gynecology

## 2015-03-23 ENCOUNTER — Telehealth: Payer: Self-pay | Admitting: Family Medicine

## 2015-03-23 ENCOUNTER — Encounter (HOSPITAL_COMMUNITY): Payer: Self-pay | Admitting: *Deleted

## 2015-03-23 ENCOUNTER — Ambulatory Visit (INDEPENDENT_AMBULATORY_CARE_PROVIDER_SITE_OTHER): Payer: Self-pay | Admitting: Family Medicine

## 2015-03-23 VITALS — BP 122/69 | HR 126 | Temp 97.8°F | Resp 18 | Wt 185.3 lb

## 2015-03-23 DIAGNOSIS — Z3493 Encounter for supervision of normal pregnancy, unspecified, third trimester: Secondary | ICD-10-CM | POA: Insufficient documentation

## 2015-03-23 DIAGNOSIS — Z3A37 37 weeks gestation of pregnancy: Secondary | ICD-10-CM | POA: Insufficient documentation

## 2015-03-23 DIAGNOSIS — Z3483 Encounter for supervision of other normal pregnancy, third trimester: Secondary | ICD-10-CM

## 2015-03-23 NOTE — MAU Note (Signed)
Pt states she is having contractions and back pain.

## 2015-03-23 NOTE — Progress Notes (Signed)
Marissa Contreras is a 24 y.o. [id number] at 1470w1d for routine follow up.  She reports back pain, radiates down legs, worse w/ laying down, better with sitting up. Denies weakness or numbness. No discharge/bleeding.  See flow sheet for details.  A/P: Pregnancy at 7670w1d.  Doing well.   Pregnancy issues include persistent back pain w/ some sciatic qualities but w/o red flags. Some n/v.   Infant feeding choice: Breast Contraception choice: Nexplanon Infant circumcision desired not applicable GBS/GC/CZ results were reviewed today.   Labor precautions reviewed. Kick counts reviewed.  Patient had contacted her PCP earlier today asking for an induction of labor 2/2 pain. It was explained to her that this was not indicated at this time. She did not discuss this w/ me during our visit.  Regarding her pain; I strongly encouraged her to do her best to rest, and when not resting to be active and/or stretching. Active stretching exercises can be very beneficial for this type of back and leg pain. I encouraged her to seek out access to a local pool as this can improve some of the pain brought on be the excess weight due to pregnancy.

## 2015-03-23 NOTE — Discharge Instructions (Signed)
Third Trimester of Pregnancy °The third trimester is from week 29 through week 42, months 7 through 9. The third trimester is a time when the fetus is growing rapidly. At the end of the ninth month, the fetus is about 20 inches in length and weighs 6-10 pounds.  °BODY CHANGES °Your body goes through many changes during pregnancy. The changes vary from woman to woman.  °· Your weight will continue to increase. You can expect to gain 25-35 pounds (11-16 kg) by the end of the pregnancy. °· You may begin to get stretch marks on your hips, abdomen, and breasts. °· You may urinate more often because the fetus is moving lower into your pelvis and pressing on your bladder. °· You may develop or continue to have heartburn as a result of your pregnancy. °· You may develop constipation because certain hormones are causing the muscles that push waste through your intestines to slow down. °· You may develop hemorrhoids or swollen, bulging veins (varicose veins). °· You may have pelvic pain because of the weight gain and pregnancy hormones relaxing your joints between the bones in your pelvis. Backaches may result from overexertion of the muscles supporting your posture. °· You may have changes in your hair. These can include thickening of your hair, rapid growth, and changes in texture. Some women also have hair loss during or after pregnancy, or hair that feels dry or thin. Your hair will most likely return to normal after your baby is born. °· Your breasts will continue to grow and be tender. A yellow discharge may leak from your breasts called colostrum. °· Your belly button may stick out. °· You may feel short of breath because of your expanding uterus. °· You may notice the fetus "dropping," or moving lower in your abdomen. °· You may have a bloody mucus discharge. This usually occurs a few days to a week before labor begins. °· Your cervix becomes thin and soft (effaced) near your due date. °WHAT TO EXPECT AT YOUR PRENATAL  EXAMS  °You will have prenatal exams every 2 weeks until week 36. Then, you will have weekly prenatal exams. During a routine prenatal visit: °· You will be weighed to make sure you and the fetus are growing normally. °· Your blood pressure is taken. °· Your abdomen will be measured to track your baby's growth. °· The fetal heartbeat will be listened to. °· Any test results from the previous visit will be discussed. °· You may have a cervical check near your due date to see if you have effaced. °At around 36 weeks, your caregiver will check your cervix. At the same time, your caregiver will also perform a test on the secretions of the vaginal tissue. This test is to determine if a type of bacteria, Group B streptococcus, is present. Your caregiver will explain this further. °Your caregiver may ask you: °· What your birth plan is. °· How you are feeling. °· If you are feeling the baby move. °· If you have had any abnormal symptoms, such as leaking fluid, bleeding, severe headaches, or abdominal cramping. °· If you have any questions. °Other tests or screenings that may be performed during your third trimester include: °· Blood tests that check for low iron levels (anemia). °· Fetal testing to check the health, activity level, and growth of the fetus. Testing is done if you have certain medical conditions or if there are problems during the pregnancy. °FALSE LABOR °You may feel small, irregular contractions that   eventually go away. These are called Braxton Hicks contractions, or false labor. Contractions may last for hours, days, or even weeks before true labor sets in. If contractions come at regular intervals, intensify, or become painful, it is best to be seen by your caregiver.  °SIGNS OF LABOR  °· Menstrual-like cramps. °· Contractions that are 5 minutes apart or less. °· Contractions that start on the top of the uterus and spread down to the lower abdomen and back. °· A sense of increased pelvic pressure or back  pain. °· A watery or bloody mucus discharge that comes from the vagina. °If you have any of these signs before the 37th week of pregnancy, call your caregiver right away. You need to go to the hospital to get checked immediately. °HOME CARE INSTRUCTIONS  °· Avoid all smoking, herbs, alcohol, and unprescribed drugs. These chemicals affect the formation and growth of the baby. °· Follow your caregiver's instructions regarding medicine use. There are medicines that are either safe or unsafe to take during pregnancy. °· Exercise only as directed by your caregiver. Experiencing uterine cramps is a good sign to stop exercising. °· Continue to eat regular, healthy meals. °· Wear a good support bra for breast tenderness. °· Do not use hot tubs, steam rooms, or saunas. °· Wear your seat belt at all times when driving. °· Avoid raw meat, uncooked cheese, cat litter boxes, and soil used by cats. These carry germs that can cause birth defects in the baby. °· Take your prenatal vitamins. °· Try taking a stool softener (if your caregiver approves) if you develop constipation. Eat more high-fiber foods, such as fresh vegetables or fruit and whole grains. Drink plenty of fluids to keep your urine clear or pale yellow. °· Take warm sitz baths to soothe any pain or discomfort caused by hemorrhoids. Use hemorrhoid cream if your caregiver approves. °· If you develop varicose veins, wear support hose. Elevate your feet for 15 minutes, 3-4 times a day. Limit salt in your diet. °· Avoid heavy lifting, wear low heal shoes, and practice good posture. °· Rest a lot with your legs elevated if you have leg cramps or low back pain. °· Visit your dentist if you have not gone during your pregnancy. Use a soft toothbrush to brush your teeth and be gentle when you floss. °· A sexual relationship may be continued unless your caregiver directs you otherwise. °· Do not travel far distances unless it is absolutely necessary and only with the approval  of your caregiver. °· Take prenatal classes to understand, practice, and ask questions about the labor and delivery. °· Make a trial run to the hospital. °· Pack your hospital bag. °· Prepare the baby's nursery. °· Continue to go to all your prenatal visits as directed by your caregiver. °SEEK MEDICAL CARE IF: °· You are unsure if you are in labor or if your water has broken. °· You have dizziness. °· You have mild pelvic cramps, pelvic pressure, or nagging pain in your abdominal area. °· You have persistent nausea, vomiting, or diarrhea. °· You have a bad smelling vaginal discharge. °· You have pain with urination. °SEEK IMMEDIATE MEDICAL CARE IF:  °· You have a fever. °· You are leaking fluid from your vagina. °· You have spotting or bleeding from your vagina. °· You have severe abdominal cramping or pain. °· You have rapid weight loss or gain. °· You have shortness of breath with chest pain. °· You notice sudden or extreme swelling   of your face, hands, ankles, feet, or legs. °· You have not felt your baby move in over an hour. °· You have severe headaches that do not go away with medicine. °· You have vision changes. °Document Released: 09/02/2001 Document Revised: 09/13/2013 Document Reviewed: 11/09/2012 °[employer]® Patient Information ©2015 [employer]. This information is not intended to replace advice given to you by your health care provider. Make sure you discuss any questions you have with your health care provider. °Fetal Movement Counts °Patient Name: __________________________________________________ Patient Due Date: ____________________ °Performing a fetal movement count is highly recommended in high-risk pregnancies, but it is good for every pregnant woman to do. Your health care provider may ask you to start counting fetal movements at 28 weeks of the pregnancy. Fetal movements often increase: °· After eating a full meal. °· After physical activity. °· After eating or drinking something sweet or  cold. °· At rest. °Pay attention to when you feel the baby is most active. This will help you notice a pattern of your baby's sleep and wake cycles and what factors contribute to an increase in fetal movement. It is important to perform a fetal movement count at the same time each day when your baby is normally most active.  °HOW TO COUNT FETAL MOVEMENTS °· Find a quiet and comfortable area to sit or lie down on your left side. Lying on your left side provides the best blood and oxygen circulation to your baby. °· Write down the day and time on a sheet of paper or in a journal. °· Start counting kicks, flutters, swishes, rolls, or jabs in a 2-hour period. You should feel at least 10 movements within 2 hours. °· If you do not feel 10 movements in 2 hours, wait 2-3 hours and count again. Look for a change in the pattern or not enough counts in 2 hours. °SEEK MEDICAL CARE IF: °· You feel less than 10 counts in 2 hours, tried twice. °· There is no movement in over an hour. °· The pattern is changing or taking longer each day to reach 10 counts in 2 hours. °· You feel the baby is not moving as he or she usually does. °Date: ____________ Movements: ____________ Start time: ____________ Finish time: ____________  °Date: ____________ Movements: ____________ Start time: ____________ Finish time: ____________ °Date: ____________ Movements: ____________ Start time: ____________ Finish time: ____________ °Date: ____________ Movements: ____________ Start time: ____________ Finish time: ____________ °Date: ____________ Movements: ____________ Start time: ____________ Finish time: ____________ °Date: ____________ Movements: ____________ Start time: ____________ Finish time: ____________ °Date: ____________ Movements: ____________ Start time: ____________ Finish time: ____________ °Date: ____________ Movements: ____________ Start time: ____________ Finish time: ____________  °Date: ____________ Movements: ____________ Start time:  ____________ Finish time: ____________ °Date: ____________ Movements: ____________ Start time: ____________ Finish time: ____________ °Date: ____________ Movements: ____________ Start time: ____________ Finish time: ____________ °Date: ____________ Movements: ____________ Start time: ____________ Finish time: ____________ °Date: ____________ Movements: ____________ Start time: ____________ Finish time: ____________ °Date: ____________ Movements: ____________ Start time: ____________ Finish time: ____________ °Date: ____________ Movements: ____________ Start time: ____________ Finish time: ____________  °Date: ____________ Movements: ____________ Start time: ____________ Finish time: ____________ °Date: ____________ Movements: ____________ Start time: ____________ Finish time: ____________ °Date: ____________ Movements: ____________ Start time: ____________ Finish time: ____________ °Date: ____________ Movements: ____________ Start time: ____________ Finish time: ____________ °Date: ____________ Movements: ____________ Start time: ____________ Finish time: ____________ °Date: ____________ Movements: ____________ Start time: ____________ Finish time: ____________ °Date: ____________ Movements: ____________ Start time: ____________ Finish time:   ____________  °Date: ____________ Movements: ____________ Start time: ____________ Finish time: ____________ °Date: ____________ Movements: ____________ Start time: ____________ Finish time: ____________ °Date: ____________ Movements: ____________ Start time: ____________ Finish time: ____________ °Date: ____________ Movements: ____________ Start time: ____________ Finish time: ____________ °Date: ____________ Movements: ____________ Start time: ____________ Finish time: ____________ °Date: ____________ Movements: ____________ Start time: ____________ Finish time: ____________ °Date: ____________ Movements: ____________ Start time: ____________ Finish time: ____________  °Date:  ____________ Movements: ____________ Start time: ____________ Finish time: ____________ °Date: ____________ Movements: ____________ Start time: ____________ Finish time: ____________ °Date: ____________ Movements: ____________ Start time: ____________ Finish time: ____________ °Date: ____________ Movements: ____________ Start time: ____________ Finish time: ____________ °Date: ____________ Movements: ____________ Start time: ____________ Finish time: ____________ °Date: ____________ Movements: ____________ Start time: ____________ Finish time: ____________ °Date: ____________ Movements: ____________ Start time: ____________ Finish time: ____________  °Date: ____________ Movements: ____________ Start time: ____________ Finish time: ____________ °Date: ____________ Movements: ____________ Start time: ____________ Finish time: ____________ °Date: ____________ Movements: ____________ Start time: ____________ Finish time: ____________ °Date: ____________ Movements: ____________ Start time: ____________ Finish time: ____________ °Date: ____________ Movements: ____________ Start time: ____________ Finish time: ____________ °Date: ____________ Movements: ____________ Start time: ____________ Finish time: ____________ °Date: ____________ Movements: ____________ Start time: ____________ Finish time: ____________  °Date: ____________ Movements: ____________ Start time: ____________ Finish time: ____________ °Date: ____________ Movements: ____________ Start time: ____________ Finish time: ____________ °Date: ____________ Movements: ____________ Start time: ____________ Finish time: ____________ °Date: ____________ Movements: ____________ Start time: ____________ Finish time: ____________ °Date: ____________ Movements: ____________ Start time: ____________ Finish time: ____________ °Date: ____________ Movements: ____________ Start time: ____________ Finish time: ____________ °Date: ____________ Movements: ____________ Start  time: ____________ Finish time: ____________  °Date: ____________ Movements: ____________ Start time: ____________ Finish time: ____________ °Date: ____________ Movements: ____________ Start time: ____________ Finish time: ____________ °Date: ____________ Movements: ____________ Start time: ____________ Finish time: ____________ °Date: ____________ Movements: ____________ Start time: ____________ Finish time: ____________ °Date: ____________ Movements: ____________ Start time: ____________ Finish time: ____________ °Date: ____________ Movements: ____________ Start time: ____________ Finish time: ____________ °Document Released: 10/08/2006 Document Revised: 01/23/2014 Document Reviewed: 07/05/2012 °[employer]® Patient Information ©2015 [employer]. This information is not intended to replace advice given to you by your health care provider. Make sure you discuss any questions you have with your health care provider. ° °

## 2015-03-23 NOTE — Patient Instructions (Signed)
Third Trimester of Pregnancy The third trimester is from week 29 through week 42, months 7 through 9. This trimester is when your unborn baby (fetus) is growing very fast. At the end of the ninth month, the unborn baby is about 20 inches in length. It weighs about 6-10 pounds.  HOME CARE   Avoid all smoking, herbs, and alcohol. Avoid drugs not approved by your doctor.  Only take medicine as told by your doctor. Some medicines are safe and some are not during pregnancy.  Exercise only as told by your doctor. Stop exercising if you start having cramps.  Eat regular, healthy meals.  Wear a good support bra if your breasts are tender.  Do not use hot tubs, steam rooms, or saunas.  Wear your seat belt when driving.  Avoid raw meat, uncooked cheese, and liter boxes and soil used by cats.  Take your prenatal vitamins.  Try taking medicine that helps you poop (stool softener) as needed, and if your doctor approves. Eat more fiber by eating fresh fruit, vegetables, and whole grains. Drink enough fluids to keep your pee (urine) clear or pale yellow.  Take warm water baths (sitz baths) to soothe pain or discomfort caused by hemorrhoids. Use hemorrhoid cream if your doctor approves.  If you have puffy, bulging veins (varicose veins), wear support hose. Raise (elevate) your feet for 15 minutes, 3-4 times a day. Limit salt in your diet.  Avoid heavy lifting, wear low heels, and sit up straight.  Rest with your legs raised if you have leg cramps or low back pain.  Visit your dentist if you have not gone during your pregnancy. Use a soft toothbrush to brush your teeth. Be gentle when you floss.  You can have sex (intercourse) unless your doctor tells you not to.  Do not travel far distances unless you must. Only do so with your doctor's approval.  Take prenatal classes.  Practice driving to the hospital.  Pack your hospital bag.  Prepare the baby's room.  Go to your doctor visits. GET  HELP IF:  You are not sure if you are in labor or if your water has broken.  You are dizzy.  You have mild cramps or pressure in your lower belly (abdominal).  You have a nagging pain in your belly area.  You continue to feel sick to your stomach (nauseous), throw up (vomit), or have watery poop (diarrhea).  You have bad smelling fluid coming from your vagina.  You have pain with peeing (urination). GET HELP RIGHT AWAY IF:   You have a fever.  You are leaking fluid from your vagina.  You are spotting or bleeding from your vagina.  You have severe belly cramping or pain.  You lose or gain weight rapidly.  You have trouble catching your breath and have chest pain.  You notice sudden or extreme puffiness (swelling) of your face, hands, ankles, feet, or legs.  You have not felt the baby move in over an hour.  You have severe headaches that do not go away with medicine.  You have vision changes. Document Released: 12/03/2009 Document Revised: 01/03/2013 Document Reviewed: 11/09/2012 ExitCare Patient Information 2015 [employer]. This information is not intended to replace advice given to you by your health care provider. Make sure you discuss any questions you have with your health care provider.  

## 2015-03-23 NOTE — Telephone Encounter (Signed)
Spoke with patient's mother. Patient desires induction due to back pain. Discussed risks of induction in early term baby. No indication to induce at this point. Pt to see Dr. Wende MottMcKeag today in clinic, can discuss treatment options for her back pain at that visit. Will not plan to induce before 41 weeks at this point.

## 2015-03-23 NOTE — Progress Notes (Signed)
Drenda FreezeFran Dishmon CNM notified of pts complaints, VE, no contractions and FHR tracing, orders received

## 2015-03-23 NOTE — Telephone Encounter (Signed)
Mrs. Marissa Contreras want you to contact her to have Marissa Contreras induced today.  She's having a lot of pain and don't want to wait any further.

## 2015-03-28 NOTE — MAU Provider Note (Signed)
History   CSN: [id number]  Arrival date and time: 03/14/15 [id number]  Chief Complaint  Patient presents with  . Laboring   HPI Patient is 24 y.o. [id number] here with complaints of back, hip and leg pain with contractions. She believes that she is laboring. Patient was seen in the MAU yesterday for similar symptoms and was told to return if worsened. Patient believes symptoms worse today. She states contractions started up again last night around 11pm and were every 3 minutes up until early this morning. She rates her pain as 6-7. She has tried tylenol and flexeril without relief.   +FM, + contractions, denies LOF, VB, vaginal discharge.   OB History    Gravida Para Term Preterm AB TAB SAB Ectopic Multiple Living   5 1 1  3  3   1     [facility]  Past Medical History  Diagnosis Date  . H/O varicella   . Back pain   . Chronic back pain   . Asthma     Past Surgical History  Procedure Laterality Date  . Tonsillectomy    . Dilation and evacuation N/A 03/08/2014    Procedure: DILATATION AND EVACUATION ; Surgeon: Purcell NailsAngela Y Roberts, MD; Location: [facility]; Service: Gynecology; Laterality: N/A; with chromosomal studies, sent     Family History  Problem Relation Age of Onset  . Hypertension Father   . Diabetes Father   . Fibromyalgia Mother   . Arthritis Mother   . Depression Mother   . Diabetes Paternal Grandmother   . Cancer Paternal Grandmother   . Stroke Paternal Grandmother     History  Substance Use Topics  . Smoking status: Former Smoker    Types: Cigarettes    Quit date: 11/21/2011  . Smokeless tobacco: Never Used     Comment: e-cig  . Alcohol Use: No    Allergies: No Known Allergies  Prescriptions prior to admission  Medication Sig Dispense Refill Last Dose  . acetaminophen (TYLENOL) 500 MG tablet Take 1,000 mg  by mouth every 6 (six) hours as needed for mild pain, moderate pain or headache.    Past Month at Unknown time  . albuterol (PROVENTIL HFA;[name] 108 (90 BASE) MCG/ACT inhaler Inhale 2 puffs into the lungs every 6 (six) hours as needed for wheezing or shortness of breath.   rescue  . calcium carbonate (TUMS) 500 MG chewable tablet Chew 1 tablet (200 mg of elemental calcium total) by mouth daily. 30 tablet 0   . cyclobenzaprine (FLEXERIL) 5 MG tablet Take 5 mg by mouth 3 (three) times daily as needed for muscle spasms.   Past Week at Unknown time  . Prenatal Vit-Fe Fumarate-FA (PRENATAL MULTIVITAMIN) TABS tablet Take 1 tablet by mouth daily at 12 noon.   Past Month at Unknown time    Review of Systems  Constitutional: Negative for fever and chills.  Eyes: Negative for blurred vision and double vision.  Respiratory: Negative for shortness of breath.  Cardiovascular: Negative for chest pain.  Gastrointestinal: Positive for abdominal pain. Negative for nausea and vomiting.  Musculoskeletal: Positive for back pain and joint pain. Negative for falls.  Neurological: Negative for headaches.  Also per HPI  Physical Exam   Blood pressure 125/74, pulse 137, temperature 98.3 F (36.8 C), temperature source Oral, resp. rate 18, last menstrual period 07/06/2014.  Physical Exam  Constitutional: She is oriented to person, place, and time. She appears well-developed and well-nourished. No distress.  HENT:  Head: Normocephalic  and atraumatic.  Eyes: EOM are normal.  Cardiovascular: Normal rate, regular rhythm, normal heart sounds and intact distal pulses.  Respiratory: Effort normal and breath sounds normal.  GI: Soft. Bowel sounds are normal. There is no tenderness.  Musculoskeletal: Normal range of motion. She exhibits tenderness. She exhibits no edema.  Neurological: She is alert and oriented to person, place, and time.  Skin: Skin is warm and dry.   Dilation: 2.5 Effacement (%): 60 Cervical Position: Middle Station: -2 Presentation: Vertex Exam by:: Sarajane Marek, RNC   Lab Results Last 24 Hours    Results for orders placed or performed during the hospital encounter of 03/14/15 (from the past 24 hour(s))  Urinalysis, Routine w reflex microscopic (not at [facility]) Status: Abnormal   Collection Time: 03/14/15 8:25 AM  Result Value Ref Range   Color, Urine YELLOW YELLOW   APPearance CLEAR CLEAR   Specific Gravity, Urine 1.020 1.005 - 1.030   pH 7.0 5.0 - 8.0   Glucose, UA NEGATIVE NEGATIVE mg/dL   Hgb urine dipstick NEGATIVE NEGATIVE   Bilirubin Urine NEGATIVE NEGATIVE   Ketones, ur 15 (A) NEGATIVE mg/dL   Protein, ur NEGATIVE NEGATIVE mg/dL   Urobilinogen, UA 2.0 (H) 0.0 - 1.0 mg/dL   Nitrite NEGATIVE NEGATIVE   Leukocytes, UA NEGATIVE NEGATIVE       MAU Course  Procedures - None  MDM: - Baby reactive and no contractions on toco - SVE x2 1 hr apart without change -UA  Assessment and Plan  A: Patient is 24 y.o. [id number] [redacted]w[redacted]d reporting pains and contractions. Patient with h/o chronic back pain. MSK complaints likely chronic but also precipitated by pregnancy. No signs of PTL and no contractions on monitor today.   P: Discharge home - Reviewed findings and my conclusion - Encouraged patient to continue using conservative treatments for pain - fetal kick counts reinforced - preterm labor precautions dicussed - Follow-up with OB provider  Caryl Ada, DO 03/14/2015, 4:38 PM PGY-1,  Family Medicine  CNM attestation:  I have seen and examined this patient; I agree with above documentation in the Resident's note.    Clemmons,Lori Greenleaf, CNM 2:39 PM

## 2015-04-05 ENCOUNTER — Inpatient Hospital Stay (HOSPITAL_COMMUNITY)
Admission: AD | Admit: 2015-04-05 | Discharge: 2015-04-05 | Disposition: A | Discharge status: Home / Self Care | Payer: Medicaid Other | Source: Ambulatory Visit | Attending: Obstetrics & Gynecology | Admitting: Obstetrics & Gynecology

## 2015-04-05 ENCOUNTER — Encounter (HOSPITAL_COMMUNITY): Payer: Self-pay | Admitting: *Deleted

## 2015-04-05 DIAGNOSIS — Z3493 Encounter for supervision of normal pregnancy, unspecified, third trimester: Secondary | ICD-10-CM | POA: Diagnosis present

## 2015-04-05 DIAGNOSIS — Z3A39 39 weeks gestation of pregnancy: Secondary | ICD-10-CM | POA: Insufficient documentation

## 2015-04-05 NOTE — Discharge Instructions (Signed)
Braxton Hicks Contractions °Contractions of the uterus can occur throughout pregnancy. Contractions are not always a sign that you are in labor.  °WHAT ARE BRAXTON HICKS CONTRACTIONS?  °Contractions that occur before labor are called Braxton Hicks contractions, or false labor. Toward the end of pregnancy (32-34 weeks), these contractions can develop more often and may become more forceful. This is not true labor because these contractions do not result in opening (dilatation) and thinning of the cervix. They are sometimes difficult to tell apart from true labor because these contractions can be forceful and people have different pain tolerances. You should not feel embarrassed if you go to the hospital with false labor. Sometimes, the only way to tell if you are in true labor is for your health care provider to look for changes in the cervix. °If there are no prenatal problems or other health problems associated with the pregnancy, it is completely safe to be sent home with false labor and await the onset of true labor. °HOW CAN YOU TELL THE DIFFERENCE BETWEEN TRUE AND FALSE LABOR? °False Labor °· The contractions of false labor are usually shorter and not as hard as those of true labor.   °· The contractions are usually irregular.   °· The contractions are often felt in the front of the lower abdomen and in the groin.   °· The contractions may go away when you walk around or change positions while lying down.   °· The contractions get weaker and are shorter lasting as time goes on.   °· The contractions do not usually become progressively stronger, regular, and closer together as with true labor.   °True Labor °1. Contractions in true labor last 30-70 seconds, become very regular, usually become more intense, and increase in frequency.   °2. The contractions do not go away with walking.   °3. The discomfort is usually felt in the top of the uterus and spreads to the lower abdomen and low back.   °4. True labor can  be determined by your health care provider with an exam. This will show that the cervix is dilating and getting thinner.   °WHAT TO REMEMBER °· Keep up with your usual exercises and follow other instructions given by your health care provider.   °· Take medicines as directed by your health care provider.   °· Keep your regular prenatal appointments.   °· Eat and drink lightly if you think you are going into labor.   °· If Braxton Hicks contractions are making you uncomfortable:   °· Change your position from lying down or resting to walking, or from walking to resting.   °· Sit and rest in a tub of warm water.   °· Drink 2-3 glasses of water. Dehydration may cause these contractions.   °· Do slow and deep breathing several times an hour.   °WHEN SHOULD I SEEK IMMEDIATE MEDICAL CARE? °Seek immediate medical care if: °· Your contractions become stronger, more regular, and closer together.   °· You have fluid leaking or gushing from your vagina.   °· You have a fever.   °· You pass blood-tinged mucus.   °· You have vaginal bleeding.   °· You have continuous abdominal pain.   °· You have low back pain that you never had before.   °· You feel your baby's head pushing down and causing pelvic pressure.   °· Your baby is not moving as much as it used to.   °Document Released: 09/08/2005 Document Revised: 09/13/2013 Document Reviewed: 06/20/2013 °[employer]® Patient Information ©2015 [employer]. This information is not intended to replace advice given to you by your health care   provider. Make sure you discuss any questions you have with your health care provider. ° °Fetal Movement Counts °Patient Name: __________________________________________________ Patient Due Date: ____________________ °Performing a fetal movement count is highly recommended in high-risk pregnancies, but it is good for every pregnant woman to do. Your health care provider may ask you to start counting fetal movements at 28 weeks of the pregnancy. Fetal  movements often increase: °· After eating a full meal. °· After physical activity. °· After eating or drinking something sweet or cold. °· At rest. °Pay attention to when you feel the baby is most active. This will help you notice a pattern of your baby's sleep and wake cycles and what factors contribute to an increase in fetal movement. It is important to perform a fetal movement count at the same time each day when your baby is normally most active.  °HOW TO COUNT FETAL MOVEMENTS °5. Find a quiet and comfortable area to sit or lie down on your left side. Lying on your left side provides the best blood and oxygen circulation to your baby. °6. Write down the day and time on a sheet of paper or in a journal. °7. Start counting kicks, flutters, swishes, rolls, or jabs in a 2-hour period. You should feel at least 10 movements within 2 hours. °8. If you do not feel 10 movements in 2 hours, wait 2-3 hours and count again. Look for a change in the pattern or not enough counts in 2 hours. °SEEK MEDICAL CARE IF: °· You feel less than 10 counts in 2 hours, tried twice. °· There is no movement in over an hour. °· The pattern is changing or taking longer each day to reach 10 counts in 2 hours. °· You feel the baby is not moving as he or she usually does. °Date: ____________ Movements: ____________ Start time: ____________ Finish time: ____________  °Date: ____________ Movements: ____________ Start time: ____________ Finish time: ____________ °Date: ____________ Movements: ____________ Start time: ____________ Finish time: ____________ °Date: ____________ Movements: ____________ Start time: ____________ Finish time: ____________ °Date: ____________ Movements: ____________ Start time: ____________ Finish time: ____________ °Date: ____________ Movements: ____________ Start time: ____________ Finish time: ____________ °Date: ____________ Movements: ____________ Start time: ____________ Finish time: ____________ °Date: ____________  Movements: ____________ Start time: ____________ Finish time: ____________  °Date: ____________ Movements: ____________ Start time: ____________ Finish time: ____________ °Date: ____________ Movements: ____________ Start time: ____________ Finish time: ____________ °Date: ____________ Movements: ____________ Start time: ____________ Finish time: ____________ °Date: ____________ Movements: ____________ Start time: ____________ Finish time: ____________ °Date: ____________ Movements: ____________ Start time: ____________ Finish time: ____________ °Date: ____________ Movements: ____________ Start time: ____________ Finish time: ____________ °Date: ____________ Movements: ____________ Start time: ____________ Finish time: ____________  °Date: ____________ Movements: ____________ Start time: ____________ Finish time: ____________ °Date: ____________ Movements: ____________ Start time: ____________ Finish time: ____________ °Date: ____________ Movements: ____________ Start time: ____________ Finish time: ____________ °Date: ____________ Movements: ____________ Start time: ____________ Finish time: ____________ °Date: ____________ Movements: ____________ Start time: ____________ Finish time: ____________ °Date: ____________ Movements: ____________ Start time: ____________ Finish time: ____________ °Date: ____________ Movements: ____________ Start time: ____________ Finish time: ____________  °Date: ____________ Movements: ____________ Start time: ____________ Finish time: ____________ °Date: ____________ Movements: ____________ Start time: ____________ Finish time: ____________ °Date: ____________ Movements: ____________ Start time: ____________ Finish time: ____________ °Date: ____________ Movements: ____________ Start time: ____________ Finish time: ____________ °Date: ____________ Movements: ____________ Start time: ____________ Finish time: ____________ °Date: ____________ Movements: ____________ Start time:  ____________ Finish time: ____________ °Date: ____________ Movements:   ____________ Start time: ____________ Finish time: ____________  °Date: ____________ Movements: ____________ Start time: ____________ Finish time: ____________ °Date: ____________ Movements: ____________ Start time: ____________ Finish time: ____________ °Date: ____________ Movements: ____________ Start time: ____________ Finish time: ____________ °Date: ____________ Movements: ____________ Start time: ____________ Finish time: ____________ °Date: ____________ Movements: ____________ Start time: ____________ Finish time: ____________ °Date: ____________ Movements: ____________ Start time: ____________ Finish time: ____________ °Date: ____________ Movements: ____________ Start time: ____________ Finish time: ____________  °Date: ____________ Movements: ____________ Start time: ____________ Finish time: ____________ °Date: ____________ Movements: ____________ Start time: ____________ Finish time: ____________ °Date: ____________ Movements: ____________ Start time: ____________ Finish time: ____________ °Date: ____________ Movements: ____________ Start time: ____________ Finish time: ____________ °Date: ____________ Movements: ____________ Start time: ____________ Finish time: ____________ °Date: ____________ Movements: ____________ Start time: ____________ Finish time: ____________ °Date: ____________ Movements: ____________ Start time: ____________ Finish time: ____________  °Date: ____________ Movements: ____________ Start time: ____________ Finish time: ____________ °Date: ____________ Movements: ____________ Start time: ____________ Finish time: ____________ °Date: ____________ Movements: ____________ Start time: ____________ Finish time: ____________ °Date: ____________ Movements: ____________ Start time: ____________ Finish time: ____________ °Date: ____________ Movements: ____________ Start time: ____________ Finish time: ____________ °Date:  ____________ Movements: ____________ Start time: ____________ Finish time: ____________ °Date: ____________ Movements: ____________ Start time: ____________ Finish time: ____________  °Date: ____________ Movements: ____________ Start time: ____________ Finish time: ____________ °Date: ____________ Movements: ____________ Start time: ____________ Finish time: ____________ °Date: ____________ Movements: ____________ Start time: ____________ Finish time: ____________ °Date: ____________ Movements: ____________ Start time: ____________ Finish time: ____________ °Date: ____________ Movements: ____________ Start time: ____________ Finish time: ____________ °Date: ____________ Movements: ____________ Start time: ____________ Finish time: ____________ °Document Released: 10/08/2006 Document Revised: 01/23/2014 Document Reviewed: 07/05/2012 °[employer]® Patient Information ©2015 [employer]. This information is not intended to replace advice given to you by your health care provider. Make sure you discuss any questions you have with your health care provider. ° °

## 2015-04-05 NOTE — MAU Note (Signed)
Pt woke up around 0100 drenched in sweat and noticed some blood when she went to the restroom and some minor cramping that comes and goes.  Still come bleeding when she wipes.  Denies LOF.

## 2015-04-06 ENCOUNTER — Encounter (HOSPITAL_COMMUNITY): Payer: Self-pay | Admitting: *Deleted

## 2015-04-06 ENCOUNTER — Inpatient Hospital Stay (HOSPITAL_COMMUNITY)
Admission: AD | Admit: 2015-04-06 | Discharge: 2015-04-06 | Disposition: A | Discharge status: Home / Self Care | Payer: Medicaid Other | Source: Ambulatory Visit | Attending: Family Medicine | Admitting: Family Medicine

## 2015-04-06 DIAGNOSIS — J4520 Mild intermittent asthma, uncomplicated: Secondary | ICD-10-CM | POA: Insufficient documentation

## 2015-04-06 DIAGNOSIS — O99513 Diseases of the respiratory system complicating pregnancy, third trimester: Secondary | ICD-10-CM | POA: Insufficient documentation

## 2015-04-06 DIAGNOSIS — J0110 Acute frontal sinusitis, unspecified: Secondary | ICD-10-CM | POA: Insufficient documentation

## 2015-04-06 DIAGNOSIS — Z87891 Personal history of nicotine dependence: Secondary | ICD-10-CM | POA: Diagnosis not present

## 2015-04-06 DIAGNOSIS — Z3A39 39 weeks gestation of pregnancy: Secondary | ICD-10-CM

## 2015-04-06 DIAGNOSIS — R0789 Other chest pain: Secondary | ICD-10-CM | POA: Diagnosis not present

## 2015-04-06 DIAGNOSIS — R079 Chest pain, unspecified: Secondary | ICD-10-CM | POA: Diagnosis present

## 2015-04-06 DIAGNOSIS — K219 Gastro-esophageal reflux disease without esophagitis: Secondary | ICD-10-CM

## 2015-04-06 DIAGNOSIS — O9989 Other specified diseases and conditions complicating pregnancy, childbirth and the puerperium: Secondary | ICD-10-CM

## 2015-04-06 LAB — URINALYSIS, ROUTINE W REFLEX MICROSCOPIC
Bilirubin Urine: NEGATIVE
Glucose, UA: NEGATIVE mg/dL
Hgb urine dipstick: NEGATIVE
Ketones, ur: 15 mg/dL — AB
Leukocytes, UA: NEGATIVE
Nitrite: NEGATIVE
PROTEIN: NEGATIVE mg/dL
Specific Gravity, Urine: 1.02 (ref 1.005–1.030)
Urobilinogen, UA: 1 mg/dL (ref 0.0–1.0)
pH: 6 (ref 5.0–8.0)

## 2015-04-06 MED ORDER — GI COCKTAIL ~~LOC~~
30.0000 mL | Freq: Once | ORAL | Status: AC
  Administered 2015-04-06: 30 mL via ORAL
  Filled 2015-04-06: qty 30

## 2015-04-06 MED ORDER — ZOLPIDEM TARTRATE 5 MG PO TABS
5.0000 mg | ORAL_TABLET | Freq: Once | ORAL | Status: AC
  Administered 2015-04-06: 5 mg via ORAL
  Filled 2015-04-06: qty 1

## 2015-04-06 MED ORDER — OXYCODONE-ACETAMINOPHEN 5-325 MG PO TABS
1.0000 | ORAL_TABLET | Freq: Once | ORAL | Status: AC
  Administered 2015-04-06: 1 via ORAL
  Filled 2015-04-06: qty 1

## 2015-04-06 NOTE — Discharge Instructions (Signed)
Braxton Hicks Contractions Contractions of the uterus can occur throughout pregnancy. Contractions are not always a sign that you are in labor.  WHAT ARE BRAXTON HICKS CONTRACTIONS?  Contractions that occur before labor are called Braxton Hicks contractions, or false labor. Toward the end of pregnancy (32-34 weeks), these contractions can develop more often and may become more forceful. This is not true labor because these contractions do not result in opening (dilatation) and thinning of the cervix. They are sometimes difficult to tell apart from true labor because these contractions can be forceful and people have different pain tolerances. You should not feel embarrassed if you go to the hospital with false labor. Sometimes, the only way to tell if you are in true labor is for your health care provider to look for changes in the cervix. If there are no prenatal problems or other health problems associated with the pregnancy, it is completely safe to be sent home with false labor and await the onset of true labor. HOW CAN YOU TELL THE DIFFERENCE BETWEEN TRUE AND FALSE LABOR? False Labor  The contractions of false labor are usually shorter and not as hard as those of true labor.   The contractions are usually irregular.   The contractions are often felt in the front of the lower abdomen and in the groin.   The contractions may go away when you walk around or change positions while lying down.   The contractions get weaker and are shorter lasting as time goes on.   The contractions do not usually become progressively stronger, regular, and closer together as with true labor.  True Labor  Contractions in true labor last 30-70 seconds, become very regular, usually become more intense, and increase in frequency.   The contractions do not go away with walking.   The discomfort is usually felt in the top of the uterus and spreads to the lower abdomen and low back.   True labor can be  determined by your health care provider with an exam. This will show that the cervix is dilating and getting thinner.  WHAT TO REMEMBER  Keep up with your usual exercises and follow other instructions given by your health care provider.   Take medicines as directed by your health care provider.   Keep your regular prenatal appointments.   Eat and drink lightly if you think you are going into labor.   If Braxton Hicks contractions are making you uncomfortable:   Change your position from lying down or resting to walking, or from walking to resting.   Sit and rest in a tub of warm water.   Drink 2-3 glasses of water. Dehydration may cause these contractions.   Do slow and deep breathing several times an hour.  WHEN SHOULD I SEEK IMMEDIATE MEDICAL CARE? Seek immediate medical care if:  Your contractions become stronger, more regular, and closer together.   You have fluid leaking or gushing from your vagina.   You have a fever.    You have vaginal bleeding.   You have continuous abdominal pain.   You have low back pain that you never had before.   You feel your baby's head pushing down and causing pelvic pressure.   Your baby is not moving as much as it used to.  Document Released: 09/08/2005 Document Revised: 09/13/2013 Document Reviewed: 06/20/2013 [facility] Patient Information 2015 [place], [place]. This information is not intended to replace advice given to you by your health care provider. Make sure you discuss  any questions you have with your health care provider. Heartburn During Pregnancy  Heartburn is a burning sensation in the chest caused by stomach acid backing up into the esophagus. Heartburn is common in pregnancy because a certain hormone (progesterone) is released when a woman is pregnant. The progesterone hormone may relax the valve that separates the esophagus from the stomach. This allows acid to go up into the esophagus, causing heartburn.  Heartburn may also happen in pregnancy because the enlarging uterus pushes up on the stomach, which pushes more acid into the esophagus. This is especially true in the later stages of pregnancy. Heartburn problems usually go away after giving birth. CAUSES  Heartburn is caused by stomach acid backing up into the esophagus. During pregnancy, this may result from various things, including:   The progesterone hormone.  Changing hormone levels.  The growing uterus pushing stomach acid upward.  Large meals.  Certain foods and drinks.  Exercise.  Increased acid production. SIGNS AND SYMPTOMS   Burning pain in the chest or lower throat.  Bitter taste in the mouth.  Coughing. DIAGNOSIS  Your health care provider will typically diagnose heartburn by taking a careful history of your concern. Blood tests may be done to check for a certain type of bacteria that is associated with heartburn. Sometimes, heartburn is diagnosed by prescribing a heartburn medicine to see if the symptoms improve. In some cases, a procedure called an endoscopy may be done. In this procedure, a tube with a light and a camera on the end (endoscope) is used to examine the esophagus and the stomach. TREATMENT  Treatment will vary depending on the severity of your symptoms. Your health care provider may recommend:  Over-the-counter medicines (antacids, acid reducers) for mild heartburn.  Prescription medicines to decrease stomach acid or to protect your stomach lining.  Certain changes in your diet.  Elevating the head of your bed by putting blocks under the legs. This helps prevent stomach acid from backing up into the esophagus when you are lying down. HOME CARE INSTRUCTIONS   Only take over-the-counter or prescription medicines as directed by your health care provider.  Raise the head of your bed by putting blocks under the legs if instructed to do so by your health care provider. Sleeping with more pillows is not  effective because it only changes the position of your head.  Do not exercise right after eating.  Avoid eating 2-3 hours before bed. Do not lie down right after eating.  Eat small meals throughout the day instead of three large meals.  Identify foods and beverages that make your symptoms worse and avoid them. Foods you may want to avoid include:  Peppers.  Chocolate.  High-fat foods, including fried foods.  Spicy foods.  Garlic and onions.  Citrus fruits, including oranges, grapefruit, lemons, and limes.  Food containing tomatoes or tomato products.  Mint.  Carbonated and caffeinated drinks.  Vinegar. SEEK MEDICAL CARE IF:  You have abdominal pain of any kind.  You feel burning in your upper abdomen or chest, especially after eating or lying down.  You have nausea and vomiting.  Your stomach feels upset after you eat. SEEK IMMEDIATE MEDICAL CARE IF:   You have severe chest pain that goes down your arm or into your jaw or neck.  You feel sweaty, dizzy, or light-headed.  You become short of breath.  You vomit blood.  You have difficulty or pain with swallowing.  You have bloody or black, tarry stools.  You have episodes of heartburn more than 3 times a week, for more than 2 weeks. MAKE SURE YOU:  Understand these instructions.  Will watch your condition.  Will get help right away if you are not doing well or get worse. Document Released: 09/05/2000 Document Revised: 09/13/2013 Document Reviewed: 04/27/2013 [facility] Patient Information 2015 [place], [place]. This information is not intended to replace advice given to you by your health care provider. Make sure you discuss any questions you have with your health care provider.

## 2015-04-06 NOTE — MAU Note (Signed)
Pt has hx of asthma, has felt pressure in her chest, comes & goes very quickly - since 1000 this a.m.  Has not felt SOB or like she was having an asthma attack.  Having lower abd cramping but it is not painful.  Has some bleeding with wiping, was seen in MAU yesterday for bleeding, was 4 cm's.  Denies LOF.  Feels nauseated but has not vomited, no diarrhea.

## 2015-04-06 NOTE — MAU Note (Signed)
Patient refusing to stay in MAU for 20min after medication given.  Philipp DeputyKim Shaw, CNM aware of patient request and okay with discharging home after taking Ambien and Percocet without waiting 20 minutes.

## 2015-04-06 NOTE — MAU Note (Signed)
Pt states her abdomen is now tender & uc's are feeling stronger, starting to have back pain.  SVE per pt request 3-4/50/-2.  Requests to be seen by provider.

## 2015-04-06 NOTE — Progress Notes (Signed)
History   Chief Complaint:  No chief complaint on file.   Braelin Brosch is  24 y.o. [id number], [redacted]w[redacted]d. Patient's last menstrual period was 07/06/2014 (within weeks)..  Her pregnancy status is positive..  She presents complaining of chest heaviness. . Onset is described as sudden and has been present since this morning.  HPI: Chest heaviness started early this morning upon waking up, intermittent episodes that last 3-5 seconds and is located right in the middle of her chest. Describes the sensation as "pressure" but not in any pain. Last year she had similar episodes happened during 1st trimester pregnancy for which she took her albuterol inhaler and the pressure went away. In the past month she has not used her inhaler at all and has only used the inhaler once post D/C done with anesthesia done here at [facility] hospital. Unaware of anything that makes it worse or better. The feeling of pressure in the middle of her chest does not radiate anywhere else. Endorses nausea, and mild headache right now. Denies SOB, dizziness, chest pain, cough, fever, nasal discharge. Her son is recovering from croup which he had 1.5 weeks ago. No other sick contacts around(done at 5:59pm)     OB History    Gravida Para Term Preterm AB TAB SAB Ectopic Multiple Living   5 1 1  3  3   1        Past Medical History  Diagnosis Date  . H/O varicella   . Back pain   . Chronic back pain   . Asthma     Past Surgical History  Procedure Laterality Date  . Tonsillectomy    . Dilation and evacuation N/A 03/08/2014    Procedure: DILATATION AND EVACUATION ;  Surgeon: Purcell Nails, MD;  Location: [facility];  Service: Gynecology;  Laterality: N/A;  with chromosomal studies, sent     Family History  Problem Relation Age of Onset  . Hypertension Father   . Diabetes Father   . Fibromyalgia Mother   . Arthritis Mother   . Depression Mother   . Diabetes Paternal Grandmother   . Cancer Paternal Grandmother   . Stroke  Paternal Grandmother     History  Substance Use Topics  . Smoking status: Former Smoker    Types: Cigarettes    Quit date: 11/21/2011  . Smokeless tobacco: Never Used     Comment: e-cig  . Alcohol Use: No    Allergies: No Known Allergies  Prescriptions prior to admission  Medication Sig Dispense Refill Last Dose  . acetaminophen (TYLENOL) 500 MG tablet Take 1,000 mg by mouth every 6 (six) hours as needed for mild pain, moderate pain or headache.    04/05/2015 at Unknown time  . hydrocortisone cream 1 % Apply 1 application topically 2 (two) times daily.   Past Week at Unknown time  . Prenatal Vit-Fe Fumarate-FA (PRENATAL MULTIVITAMIN) TABS tablet Take 1 tablet by mouth daily at 12 noon.   Past Week at Unknown time  . albuterol (PROVENTIL HFA;[name] 108 (90 BASE) MCG/ACT inhaler Inhale 2 puffs into the lungs every 6 (six) hours as needed for wheezing or shortness of breath.   rescue  . calcium carbonate (TUMS) 500 MG chewable tablet Chew 1 tablet (200 mg of elemental calcium total) by mouth daily. (Patient not taking: Reported on 04/05/2015) 30 tablet 0 Taking     Physical Exam   Blood pressure 128/77, pulse 104, temperature 97.9 F (36.6 C), temperature source Oral, resp. rate 18, last  menstrual period 07/06/2014, SpO2 98 %.  General: General appearance - alert, well appearing, and in no distress Eyes - left eye normal, right eye normal Neck - supple, no significant adenopathy Lymphatics - no palpable lymphadenopathy Chest - clear to auscultation, no wheezes, rales or rhonchi, symmetric air entry, no tachypnea, retractions or cyanosis Heart - normal rate, regular rhythm, normal S1, S2, no murmurs, rubs, clicks or gallops Skin - normal coloration and turgor, no rashes, no suspicious skin lesions noted Focused Gynecological Exam: examination done by Judy(Nurse) and Selena Batten Deziah Renwick(Midwife)      Most Recent Value    Fetal Heart Rate A     Mode  External filed at 04/06/2015  1730    Baseline Rate (A)  140 bpm filed at 04/06/2015 1730    Variability  6-25 BPM filed at 04/06/2015 1730    Accelerations  15 x 15 filed at 04/06/2015 1730    Decelerations  Variable filed at 04/06/2015 1730         Labs: Results for orders placed or performed during the hospital encounter of 04/06/15 (from the past 24 hour(s))  Urinalysis, Routine w reflex microscopic (not at [facility])   Collection Time: 04/06/15  4:08 PM  Result Value Ref Range   Color, Urine YELLOW YELLOW   APPearance CLEAR CLEAR   Specific Gravity, Urine 1.020 1.005 - 1.030   pH 6.0 5.0 - 8.0   Glucose, UA NEGATIVE NEGATIVE mg/dL   Hgb urine dipstick NEGATIVE NEGATIVE   Bilirubin Urine NEGATIVE NEGATIVE   Ketones, ur 15 (A) NEGATIVE mg/dL   Protein, ur NEGATIVE NEGATIVE mg/dL   Urobilinogen, UA 1.0 0.0 - 1.0 mg/dL   Nitrite NEGATIVE NEGATIVE   Leukocytes, UA NEGATIVE NEGATIVE      Assessment:  Zoe Creasman is a 24 year old, G5P1031 that is [redacted]w[redacted]d, with a history of smoking and asthma, that presents with chest tightness that started this morning. Her asthma would be classified as intermittent and has not used her albuterol inhaler in about a year. Denies any chest pain, SOB, dizziness and no wheezing or posterior calf tenderness noted on physical exam. Concerns for DVT, MI, Asthma exacerbation or PE are very low. Her diagnosis is more consistent with dyspepsia vs. Pressure from growing fetus.  Plan: GI cocktail" (liquid antacid, viscous lidocaine, and an anticholinergic) to see if chest pressure feeling subsides, if so can be discharge on labor precautions. Discuss if any sudden chest pain, shortness of breath, palpitation, intense headache w/aura go to ED or return to MAU.   Reassessment 7:05pm(04/06/2015)- Patient complains of new onset diffuse abdominal pain, pelvic pain, lower back pain, and increased frequency of contractions. Is in acute distress and does not want to be seen by midwife. Mother of  patient expressed concerns about long distance drive to [place] 45 mins away and does not want her daughter to give birth in a [place]. Mom wants to stay until 8pm and see if her daughter's cervix dilates any further.   Plan  I could not confirm validity of her increasing frequency on contraction by fetal monitoring because it was removed due to past plan to discharge patient. At this time I would like to place the patient back on fetal monitor and watch for any changes in contractions. If progressing, then possible L&D admit may be warranted but otherwise reexamination can be done and if no changes are found she can be discharged.     Patient Active Problem List   Diagnosis Date Noted  .  Sinusitis, acute frontal 02/08/2015  . Mild intermittent asthma 12/20/2014  . Supervision of other normal pregnancy 01/11/2014  . H/O varicella      Discharge Medications:   Medication List    ASK your doctor about these medications        acetaminophen 500 MG tablet  Commonly known as:  TYLENOL  Take 1,000 mg by mouth every 6 (six) hours as needed for mild pain, moderate pain or headache.     albuterol 108 (90 BASE) MCG/ACT inhaler  Commonly known as:  PROVENTIL HFA;VENTOLIN HFA  Inhale 2 puffs into the lungs every 6 (six) hours as needed for wheezing or shortness of breath.     calcium carbonate 500 MG chewable tablet  Commonly known as:  TUMS  Chew 1 tablet (200 mg of elemental calcium total) by mouth daily.     hydrocortisone cream 1 %  Apply 1 application topically 2 (two) times daily.     prenatal multivitamin Tabs tablet  Take 1 tablet by mouth daily at 12 noon.         Theodora BlowStephen Frempong Jr. 04/06/2015, 5:59 PM   CNM attestation:   Franky Machooni Vosler is a 24 y.o. [phone] reporting chest pressure and ctx +FM, denies LOF, VB, vaginal discharge.  PE: BP 122/70 mmHg  Pulse 107  Temp(Src) 97.9 F (36.6 C) (Oral)  Resp 16  SpO2 98%  LMP 07/06/2014 (Within Weeks) Gen: calm  comfortable, NAD Resp: normal effort, no distress, lungs clear Abd: gravid  ROS, labs, PMH reviewed NST reactive 130-140s, +accels, no decels No ctx per toco Cx 3+/50/-2vtx by me (likely unchanged since last exam of 4cm)  Plan: Given GI cocktail which helped moderately w/ chest discomfort D/C home w/ labor precautions F/U as scheduled w/ [facility] for next visit, or sooner w/ labor/ROM/bldg  Cam HaiSHAW, Hezzie Karim, CNM 3:20 PM

## 2015-04-12 ENCOUNTER — Encounter (HOSPITAL_COMMUNITY): Payer: Self-pay

## 2015-04-12 ENCOUNTER — Inpatient Hospital Stay (HOSPITAL_COMMUNITY): Payer: Medicaid Other | Admitting: Anesthesiology

## 2015-04-12 ENCOUNTER — Inpatient Hospital Stay (HOSPITAL_COMMUNITY)
Admission: AD | Admit: 2015-04-12 | Discharge: 2015-04-14 | DRG: 774 | Disposition: A | Discharge status: Home / Self Care | Payer: Medicaid Other | Source: Ambulatory Visit | Attending: Family Medicine | Admitting: Family Medicine

## 2015-04-12 DIAGNOSIS — Z823 Family history of stroke: Secondary | ICD-10-CM

## 2015-04-12 DIAGNOSIS — Z87891 Personal history of nicotine dependence: Secondary | ICD-10-CM | POA: Diagnosis not present

## 2015-04-12 DIAGNOSIS — J45909 Unspecified asthma, uncomplicated: Secondary | ICD-10-CM | POA: Diagnosis present

## 2015-04-12 DIAGNOSIS — O4693 Antepartum hemorrhage, unspecified, third trimester: Secondary | ICD-10-CM | POA: Diagnosis present

## 2015-04-12 DIAGNOSIS — Z8249 Family history of ischemic heart disease and other diseases of the circulatory system: Secondary | ICD-10-CM

## 2015-04-12 DIAGNOSIS — O4593 Premature separation of placenta, unspecified, third trimester: Secondary | ICD-10-CM | POA: Diagnosis present

## 2015-04-12 DIAGNOSIS — Z3A40 40 weeks gestation of pregnancy: Secondary | ICD-10-CM | POA: Diagnosis present

## 2015-04-12 DIAGNOSIS — O9952 Diseases of the respiratory system complicating childbirth: Secondary | ICD-10-CM | POA: Diagnosis present

## 2015-04-12 DIAGNOSIS — Z833 Family history of diabetes mellitus: Secondary | ICD-10-CM | POA: Diagnosis not present

## 2015-04-12 DIAGNOSIS — O99824 Streptococcus B carrier state complicating childbirth: Secondary | ICD-10-CM | POA: Diagnosis present

## 2015-04-12 LAB — CBC
HCT: 29.2 % — ABNORMAL LOW (ref 36.0–46.0)
Hemoglobin: 9 g/dL — ABNORMAL LOW (ref 12.0–15.0)
MCH: 23.9 pg — ABNORMAL LOW (ref 26.0–34.0)
MCHC: 30.8 g/dL (ref 30.0–36.0)
MCV: 77.5 fL — AB (ref 78.0–100.0)
PLATELETS: 250 10*3/uL (ref 150–400)
RBC: 3.77 MIL/uL — AB (ref 3.87–5.11)
RDW: 16.2 % — AB (ref 11.5–15.5)
WBC: 10.6 10*3/uL — ABNORMAL HIGH (ref 4.0–10.5)

## 2015-04-12 LAB — TYPE AND SCREEN
ABO/RH(D): A POS
Antibody Screen: NEGATIVE

## 2015-04-12 MED ORDER — LACTATED RINGERS IV SOLN
INTRAVENOUS | Status: DC
  Administered 2015-04-13: 01:00:00 via INTRAVENOUS

## 2015-04-12 MED ORDER — FENTANYL 2.5 MCG/ML BUPIVACAINE 1/10 % EPIDURAL INFUSION (WH - ANES)
14.0000 mL/h | INTRAMUSCULAR | Status: DC | PRN
  Administered 2015-04-12 – 2015-04-13 (×2): 14 mL/h via EPIDURAL
  Filled 2015-04-12 (×2): qty 125

## 2015-04-12 MED ORDER — PHENYLEPHRINE 40 MCG/ML (10ML) SYRINGE FOR IV PUSH (FOR BLOOD PRESSURE SUPPORT)
80.0000 ug | PREFILLED_SYRINGE | INTRAVENOUS | Status: DC | PRN
Start: 2015-04-12 — End: 2015-04-13
  Filled 2015-04-12: qty 2
  Filled 2015-04-12: qty 20

## 2015-04-12 MED ORDER — EPHEDRINE 5 MG/ML INJ
10.0000 mg | INTRAVENOUS | Status: DC | PRN
Start: 2015-04-12 — End: 2015-04-13
  Filled 2015-04-12: qty 2

## 2015-04-12 MED ORDER — PENICILLIN G POTASSIUM 5000000 UNITS IJ SOLR
5.0000 10*6.[IU] | Freq: Once | INTRAMUSCULAR | Status: AC
  Administered 2015-04-12: 5 10*6.[IU] via INTRAVENOUS
  Filled 2015-04-12: qty 5

## 2015-04-12 MED ORDER — ONDANSETRON HCL 4 MG/2ML IJ SOLN
4.0000 mg | Freq: Four times a day (QID) | INTRAMUSCULAR | Status: DC | PRN
  Administered 2015-04-13: 4 mg via INTRAVENOUS
  Filled 2015-04-12: qty 2

## 2015-04-12 MED ORDER — LACTATED RINGERS IV SOLN: 500.0000 mL | INTRAVENOUS | Status: DC | PRN

## 2015-04-12 MED ORDER — DEXTROSE 5 % IV SOLN
2.5000 10*6.[IU] | Freq: Four times a day (QID) | INTRAVENOUS | Status: DC
  Administered 2015-04-12 – 2015-04-13 (×2): 2.5 10*6.[IU] via INTRAVENOUS
  Filled 2015-04-12 (×4): qty 2.5

## 2015-04-12 MED ORDER — OXYCODONE-ACETAMINOPHEN 5-325 MG PO TABS: 2.0000 | ORAL_TABLET | ORAL | Status: DC | PRN

## 2015-04-12 MED ORDER — FENTANYL 2.5 MCG/ML BUPIVACAINE 1/10 % EPIDURAL INFUSION (WH - ANES): 14.0000 mL/h | INTRAMUSCULAR | Status: DC | PRN

## 2015-04-12 MED ORDER — LIDOCAINE HCL (PF) 1 % IJ SOLN
INTRAMUSCULAR | Status: DC | PRN
  Administered 2015-04-12: 4 mL
  Administered 2015-04-12: 6 mL
  Administered 2015-04-13: 5 mL
  Administered 2015-04-13: 3 mL

## 2015-04-12 MED ORDER — OXYCODONE-ACETAMINOPHEN 5-325 MG PO TABS: 1.0000 | ORAL_TABLET | ORAL | Status: DC | PRN

## 2015-04-12 MED ORDER — DIPHENHYDRAMINE HCL 50 MG/ML IJ SOLN: 12.5000 mg | INTRAMUSCULAR | Status: DC | PRN

## 2015-04-12 MED ORDER — OXYTOCIN 40 UNITS IN LACTATED RINGERS INFUSION - SIMPLE MED
62.5000 mL/h | INTRAVENOUS | Status: DC
Start: 2015-04-12 — End: 2015-04-13

## 2015-04-12 MED ORDER — OXYTOCIN 40 UNITS IN LACTATED RINGERS INFUSION - SIMPLE MED
1.0000 m[IU]/min | INTRAVENOUS | Status: DC
  Administered 2015-04-12: 2 m[IU]/min via INTRAVENOUS
  Filled 2015-04-12: qty 1000

## 2015-04-12 MED ORDER — ACETAMINOPHEN 325 MG PO TABS: 650.0000 mg | ORAL_TABLET | ORAL | Status: DC | PRN

## 2015-04-12 MED ORDER — TERBUTALINE SULFATE 1 MG/ML IJ SOLN: 0.2500 mg | Freq: Once | INTRAMUSCULAR | Status: AC | PRN

## 2015-04-12 MED ORDER — LIDOCAINE HCL (PF) 1 % IJ SOLN
30.0000 mL | INTRAMUSCULAR | Status: DC | PRN
  Filled 2015-04-12: qty 30

## 2015-04-12 MED ORDER — OXYTOCIN BOLUS FROM INFUSION
500.0000 mL | INTRAVENOUS | Status: DC
  Administered 2015-04-13: 500 mL via INTRAVENOUS

## 2015-04-12 MED ORDER — CITRIC ACID-SODIUM CITRATE 334-500 MG/5ML PO SOLN: 30.0000 mL | ORAL | Status: DC | PRN

## 2015-04-12 NOTE — MAU Note (Signed)
Pt stated she saw blood when she wiped and in the toilet when she went to the BR. Denies pain and reprots god fetal movement

## 2015-04-12 NOTE — MAU Note (Signed)
Report called to Rainey RN on BS. Will go to [phone] 

## 2015-04-12 NOTE — Progress Notes (Signed)
   Marissa Contreras is a 24 y.o. [phone] at [redacted]w[redacted]d  admitted for induction of labor due to vaginal bleeding.  Subjective:  Comfortable with epidural.  No bleeding  Objective: Filed Vitals:   04/12/15 2310 04/12/15 2315 04/12/15 2320 04/12/15 2325  BP: 123/75 133/80 117/71 135/77  Pulse: 96 96 92 101  Temp:      TempSrc:      Resp: Height:      Weight:      SpO2:          FHT:  FHR: 140 bpm, variability: moderate,  accelerations:  Present,  decelerations:  Absent UC:   regular, every 3-4 minutes SVE:   Dilation: 5.5 Effacement (%): 90 Station: 0 Exam by:: C.Okoroji RN Pitocin @ 10 mu/min  Labs: Lab Results  Component Value Date   WBC 10.6* 04/12/2015   HGB 9.0* 04/12/2015   HCT 29.2* 04/12/2015   MCV 77.5* 04/12/2015   PLT 250 04/12/2015    Assessment / Plan: Induction of labor due to vaginal bleeding, resolved,  progressing well on pitocin  Labor: Progressing normally Fetal Wellbeing:  Category I Pain Control:  Epidural Anticipated MOD:  NSVD  CRESENZO-DISHMAN,Athalee Esterline 04/12/2015, 11:47 PM

## 2015-04-12 NOTE — Anesthesia Preprocedure Evaluation (Signed)
Anesthesia Evaluation  Patient identified by MRN, date of birth, ID band Patient awake    Reviewed: Allergy & Precautions, H&P , Patient's Chart, lab work & pertinent test results  Airway Mallampati: II  TM Distance: >3 FB Neck ROM: full    Dental  (+) Teeth Intact   Pulmonary asthma , former smoker,  breath sounds clear to auscultation        Cardiovascular Rhythm:regular Rate:Normal     Neuro/Psych    GI/Hepatic   Endo/Other    Renal/GU      Musculoskeletal   Abdominal   Peds  Hematology  (+) anemia ,   Anesthesia Other Findings       Reproductive/Obstetrics (+) Pregnancy                             Anesthesia Physical Anesthesia Plan  ASA: II  Anesthesia Plan: Epidural   Post-op Pain Management:    Induction:   Airway Management Planned:   Additional Equipment:   Intra-op Plan:   Post-operative Plan:   Informed Consent: I have reviewed the patients History and Physical, chart, labs and discussed the procedure including the risks, benefits and alternatives for the proposed anesthesia with the patient or authorized representative who has indicated his/her understanding and acceptance.   Dental Advisory Given  Plan Discussed with:   Anesthesia Plan Comments: (Labs checked- platelets confirmed with RN in room. Fetal heart tracing, per RN, reported to be stable enough for sitting procedure. Discussed epidural, and patient consents to the procedure:  included risk of possible headache,backache, failed block, allergic reaction, and nerve injury. This patient was asked if she had any questions or concerns before the procedure started.)        Anesthesia Quick Evaluation

## 2015-04-12 NOTE — H&P (Signed)
Marissa Contreras is a 24 y.o. [id number] at [redacted]w[redacted]d presenting with c/o vaginal bleeding. Pt noted bright red blood in the toilet and on toilet paper around 1330 this afternoon after wiping. Reports it was more than a menstrual amount, and was sudden and painless. Bleeding progressively lessened after initial onset and has not filled a pad. Pt also c/o approximately 6h of cramping every night x 1 week. She had intercourse 2/3 days ago and was last evaluated by an OB three weeks ago. Pt reports family dog hit her in the stomach 5 days ago, but denies VB or ctx after the event.  Pt reports intermittent prenatal care due to difficulty with scheduling. She has taken tylenol prn throughout pregnancy and was taking PNV until one week ago. Denies substance abuse.  Denies HA, chest pain, SOB, new swelling, and RUQ pain. Denies fever, urinary frequency, and dysuria.  +FM, yellow mucus discharge x 1 week, intermittent cramping pain in her pelvic region. Denies LOF.  Maternal Medical History:  Reason for admission: Vaginal bleeding.  Nausea.  Contractions: Onset was 1 week ago.   Frequency: rare.   Perceived severity is mild.    Fetal activity: Perceived fetal activity is normal.      OB History    Gravida Para Term Preterm AB TAB SAB Ectopic Multiple Living   5 1 1  3  3   1      Past Medical History  Diagnosis Date  . H/O varicella   . Back pain   . Chronic back pain   . Asthma    Past Surgical History  Procedure Laterality Date  . Tonsillectomy    . Dilation and evacuation N/A 03/08/2014    Procedure: DILATATION AND EVACUATION ;  Surgeon: Purcell Nails, MD;  Location: [facility];  Service: Gynecology;  Laterality: N/A;  with chromosomal studies, sent    Family History: family history includes Arthritis in her mother; Cancer in her paternal grandmother; Depression in her mother; Diabetes in her father and paternal grandmother; Fibromyalgia in her mother; Hypertension in her father; Stroke in her  paternal grandmother. Social History:  reports that she quit smoking about 3 years ago. Her smoking use included Cigarettes. She has never used smokeless tobacco. She reports that she does not drink alcohol or use illicit drugs.   [facility] (Dr. Beverely Low - pager [phone])  Genetic Screen declined   Anatomic [place] Normal at 18w but limited views, repeat at 23 weeks normal female  Glucose Screen 1hr - 88  GBS positive  Feeding Preference breast  Contraception Nexplanon  Circumcision n/a  Pap 09/11/14      Prenatal Transfer Tool  Maternal Diabetes: No Genetic Screening: Declined Maternal Ultrasounds/Referrals: Normal Fetal Ultrasounds or other Referrals:  None Maternal Substance Abuse:  No Significant Maternal Medications:  None Significant Maternal Lab Results:  Lab values include: Group B Strep positive Other Comments:    Review of Systems  Constitutional: Negative for fever and chills.  Eyes: Negative for blurred vision and double vision.  Respiratory: Negative for shortness of breath.   Cardiovascular: Negative for chest pain, palpitations and leg swelling.  Gastrointestinal: Positive for abdominal pain. Negative for nausea and vomiting.  Genitourinary: Negative for dysuria, urgency and frequency.  Neurological: Positive for dizziness. Negative for headaches.       Dizziness not beyond baseline    Dilation: 3 Effacement (%): 70 Station: -2 Exam by:: Tiwanda Threats CNM Blood pressure 155/95, pulse 119, resp. rate 18, last  menstrual period 07/06/2014, SpO2 98 %. Exam Physical Exam  Constitutional: She is oriented to person, place, and time. She appears well-developed and well-nourished. No distress.  HENT:  Head: Normocephalic and atraumatic.  Eyes: EOM are normal.  Neck: Normal range of motion.  Cardiovascular: Normal rate, regular rhythm and intact distal pulses.  Exam reveals no gallop and no friction rub.   No murmur heard. Respiratory:  Effort normal and breath sounds normal. No respiratory distress. She has no wheezes. She has no rales.  GI: There is no tenderness.  gravid  Musculoskeletal:  Trace nonpitting edema  Neurological: She is alert and oriented to person, place, and time.  Skin: Skin is warm and dry. She is not diaphoretic.  Psychiatric: She has a normal mood and affect. Her behavior is normal. Thought content normal.    Prenatal labs: ABO, Rh: A/POS/-- (12/14 1401) Antibody: NEG (12/14 1401) Rubella: 1.73 (12/14 1401) RPR: NON REAC (04/29 1540)  HBsAg: NEGATIVE (12/14 1401)  HIV: NONREACTIVE (04/29 1540)  GBS: Detected (06/24 1418)   Assessment/Plan: Marissa Contreras is a 24 y.o. [phone] at [redacted]w[redacted]d presenting with one episode of vaginal bleeding.   1. SIUP at [redacted]w[redacted]d -IOL per Dr. Erin Fulling  2. Third trimester bleeding  -History and exam reassuring for likely bloody show.    Parks Ranger 04/12/2015, 3:48 PM  Seen and examined by me Please see my H&P for official documentation Aviva Signs, CNM

## 2015-04-12 NOTE — Anesthesia Procedure Notes (Addendum)
Epidural Patient location during procedure: OB  Preanesthetic Checklist Completed: patient identified, site marked, surgical consent, pre-op evaluation, timeout performed, IV checked, risks and benefits discussed and monitors and equipment checked  Epidural Patient position: sitting Prep: site prepped and draped and DuraPrep Patient monitoring: continuous pulse ox and blood pressure Approach: midline Location: L3-L4 Injection technique: LOR air  Needle:  Needle type: Tuohy  Needle gauge: 17 G Needle length: 9 cm and 9 Needle insertion depth: 7 cm Catheter type: closed end flexible Catheter size: 19 Gauge Catheter at skin depth: 14 cm Test dose: negative  Assessment Events: blood not aspirated, injection not painful, no injection resistance, negative IV test and no paresthesia  Additional Notes Dosing of Epidural:  1st dose, through catheter ............................................Marland Kitchen  Xylocaine 40 mg  2nd dose, through catheter, after waiting 3 minutes........Marland KitchenXylocaine 60 mg    ( 1% Xylo charted as a single dose in Epic Meds for ease of charting; actual dosing was fractionated as above, for saftey's sake)  As each dose occurred, patient was free of IV sx; and patient exhibited no evidence of SA injection.  Patient is more comfortable after epidural dosed. Please see RN's note for documentation of vital signs,and FHR which are stable.  Patient reminded not to try to ambulate with numb legs, and that an RN must be present when she attempts to get up.      Epidural Patient location during procedure: OB  Preanesthetic Checklist Completed: patient identified, site marked, surgical consent, pre-op evaluation, timeout performed, IV checked, risks and benefits discussed and monitors and equipment checked  Epidural Patient position: sitting Prep: site prepped and draped and DuraPrep Patient monitoring: continuous pulse ox and blood pressure Approach:  midline Location: L2-L3 Injection technique: LOR air  Needle:  Needle type: Tuohy  Needle gauge: 17 G Needle length: 9 cm and 9 Needle insertion depth: 5 cm cm Catheter type: closed end flexible Catheter size: 19 Gauge Catheter at skin depth: 11 cm Test dose: negative  Assessment Events: blood not aspirated, injection not painful, no injection resistance, negative IV test and no paresthesia  Additional Notes Dosing of Epidural:  1st dose, through catheter ............................................Marland Kitchen Xylocaine 30 mg  2nd dose, through catheter, after waiting 3 minutes.......Marland KitchenXylocaine 50 mg   ( 1% Xylo charted as a single dose in Epic Meds for ease of charting; actual dosing was fractionated as above, for saftey's sake)  As each dose occurred, patient was free of IV sx; and patient exhibited no evidence of SA injection.  Patient is more comfortable after epidural dosed. Please see RN's note for documentation of vital signs,and FHR which are stable.  Patient reminded not to try to ambulate with numb legs, and that an RN must be present the 1st time she attempts to get up.

## 2015-04-12 NOTE — H&P (Signed)
This is a 24 y.o. [id number] at [redacted]w[redacted]d presenting with c/o vaginal bleeding. She states it was a "large amount" of bright red blood in the toilet and on toilet paper around 1330. Reports it was more than a menstrual amount, and was sudden and painless. Bleeding progressively lessened after initial onset and has not filled a pad.   She also c/o approximately 6h of cramping every night x 1 week. She had intercourse 2/3 days ago and was last evaluated by an OB three weeks ago. Pt reports family dog hit her in the stomach 5 days ago, but denies VB or ctx after the event.   She has been followed by Dr Richarda Blade at family practice.  She has missed appointments due to difficulty with scheduling. Denies substance abuse.    Denies HA, chest pain, SOB, new swelling, and RUQ pain. Denies fever, urinary frequency, and dysuria.  +FM, yellow mucus discharge x 1 week, intermittent cramping pain in her pelvic region. Denies LOF.  Maternal Medical History:  Reason for admission: Vaginal bleeding.  Nausea.  Contractions: Onset was 1 week ago.   Frequency: rare.   Perceived severity is mild.    Fetal activity: Perceived fetal activity is normal.      OB History    Gravida Para Term Preterm AB TAB SAB Ectopic Multiple Living   Past Medical History  Diagnosis Date  . H/O varicella   . Back pain   . Chronic back pain   . Asthma    Past Surgical History  Procedure Laterality Date  . Tonsillectomy    . Dilation and evacuation N/A 03/08/2014    Procedure: DILATATION AND EVACUATION ;  Surgeon: Purcell Nails, MD;  Location: [facility];  Service: Gynecology;  Laterality: N/A;  with chromosomal studies, sent    Family History: family history includes Arthritis in her mother; Cancer in her paternal grandmother; Depression in her mother; Diabetes in her father and paternal grandmother; Fibromyalgia in her mother; Hypertension in her father; Stroke in her paternal grandmother. Social History:   reports that she quit smoking about 3 years ago. Her smoking use included Cigarettes. She has never used smokeless tobacco. She reports that she does not drink alcohol or use illicit drugs.  Medical, Surgical, Family and Social histories reviewed and are listed above.  Medications and allergies reviewed.   [facility] (Dr. Beverely Low - pager [phone])  Genetic Screen declined   Anatomic [place] Normal at 18w but limited views, repeat at 23 weeks normal female  Glucose Screen 1hr - 88  GBS positive  Feeding Preference breast  Contraception Nexplanon  Circumcision n/a  Pap 09/11/14      Prenatal Transfer Tool  Maternal Diabetes: No Genetic Screening: Declined Maternal Ultrasounds/Referrals: Normal Fetal Ultrasounds or other Referrals:  None Maternal Substance Abuse:  No Significant Maternal Medications:  None Significant Maternal Lab Results:  Lab values include: Group B Strep positive Other Comments:    Review of Systems  Constitutional: Negative for fever and chills.  Eyes: Negative for blurred vision and double vision.  Respiratory: Negative for shortness of breath.   Cardiovascular: Negative for chest pain, palpitations and leg swelling.  Gastrointestinal: Positive for abdominal pain. Negative for nausea and vomiting.  Genitourinary: Negative for dysuria, urgency and frequency.  Neurological: Positive for dizziness. Negative for headaches.       Dizziness not beyond baseline    Dilation:  3 Effacement (%): 70 Station: -2 Exam by:: Dhani Dannemiller CNM Blood pressure 155/95, pulse 119, resp. rate 18, last menstrual period 07/06/2014, SpO2 98 %. Filed Vitals:   04/12/15 1805 04/12/15 1830 04/12/15 1905 04/12/15 1930  BP: 131/91 125/85 134/93 135/85  Pulse: 97 97 98 103  Temp:  98.5 F (36.9 C)  97.9 F (36.6 C)  TempSrc:  Oral  Oral  Resp: 20 20 18 18   Height:      Weight:      SpO2:        Physical Exam  Constitutional: She appears  well-developed and well-nourished. No distress.  Cardiovascular: Normal rate, regular rhythm and intact distal pulses.  Exam reveals no gallop and no friction rub.   No murmur heard. Respiratory: Effort normal and breath sounds normal. No respiratory distress. She has no wheezes. She has no rales.  GI: There is no tenderness. Abdomen is gravid in contour.  Size = dates. Musculoskeletal: Trace nonpitting edema  Neurological: She is alert and oriented to person, place, and time.  Skin: Skin is warm and dry. She is not diaphoretic.  Psychiatric: She has a normal mood and affect. Her behavior is normal. Thought content normal.    Prenatal labs: ABO, Rh: A/POS/-- (12/14 1401) Antibody: NEG (12/14 1401) Rubella: 1.73 (12/14 1401) RPR: NON REAC (04/29 1540)  HBsAg: NEGATIVE (12/14 1401)  HIV: NONREACTIVE (04/29 1540)  GBS: Detected (06/24 1418)   Assessment/Plan:  1. SIUP at [redacted]w[redacted]d 2. Third trimester bleeding, most likely cervical friability with possible cervical polyp.  No evidence for abruption.  P:  Admit to Birthing suites per consult Dr Erin Fulling       Since she is full term, with a favorable cervix and having third trimester bleeding, will admit for induction of labor with Pitocin       Plan Penicillin for GBS prophylaxis       Dr Richarda Blade will manage her labor   Aviva Signs, CNM

## 2015-04-13 ENCOUNTER — Encounter (HOSPITAL_COMMUNITY): Payer: Self-pay | Admitting: Emergency Medicine

## 2015-04-13 DIAGNOSIS — O4593 Premature separation of placenta, unspecified, third trimester: Secondary | ICD-10-CM

## 2015-04-13 DIAGNOSIS — Z3A40 40 weeks gestation of pregnancy: Secondary | ICD-10-CM

## 2015-04-13 DIAGNOSIS — O9952 Diseases of the respiratory system complicating childbirth: Secondary | ICD-10-CM

## 2015-04-13 DIAGNOSIS — O99824 Streptococcus B carrier state complicating childbirth: Secondary | ICD-10-CM

## 2015-04-13 LAB — HIV ANTIBODY (ROUTINE TESTING W REFLEX): HIV Screen 4th Generation wRfx: NONREACTIVE

## 2015-04-13 LAB — RPR: RPR: NONREACTIVE

## 2015-04-13 MED ORDER — MEASLES, MUMPS & RUBELLA VAC ~~LOC~~ INJ
0.5000 mL | INJECTION | Freq: Once | SUBCUTANEOUS | Status: DC
  Filled 2015-04-13: qty 0.5

## 2015-04-13 MED ORDER — FLEET ENEMA 7-19 GM/118ML RE ENEM: 1.0000 | ENEMA | Freq: Every day | RECTAL | Status: DC | PRN

## 2015-04-13 MED ORDER — TETANUS-DIPHTH-ACELL PERTUSSIS 5-2.5-18.5 LF-MCG/0.5 IM SUSP: 0.5000 mL | Freq: Once | INTRAMUSCULAR | Status: DC

## 2015-04-13 MED ORDER — FERROUS SULFATE 325 (65 FE) MG PO TABS
325.0000 mg | ORAL_TABLET | Freq: Two times a day (BID) | ORAL | Status: DC
  Administered 2015-04-14: 325 mg via ORAL
  Filled 2015-04-13 (×2): qty 1

## 2015-04-13 MED ORDER — ZOLPIDEM TARTRATE 5 MG PO TABS: 5.0000 mg | ORAL_TABLET | Freq: Every evening | ORAL | Status: DC | PRN

## 2015-04-13 MED ORDER — OXYTOCIN 40 UNITS IN LACTATED RINGERS INFUSION - SIMPLE MED: 62.5000 mL/h | INTRAVENOUS | Status: DC | PRN

## 2015-04-13 MED ORDER — LANOLIN HYDROUS EX OINT: TOPICAL_OINTMENT | CUTANEOUS | Status: DC | PRN

## 2015-04-13 MED ORDER — ONDANSETRON HCL 4 MG/2ML IJ SOLN: 4.0000 mg | INTRAMUSCULAR | Status: DC | PRN

## 2015-04-13 MED ORDER — ACETAMINOPHEN 325 MG PO TABS: 650.0000 mg | ORAL_TABLET | ORAL | Status: DC | PRN

## 2015-04-13 MED ORDER — ONDANSETRON HCL 4 MG PO TABS
4.0000 mg | ORAL_TABLET | ORAL | Status: DC | PRN
  Administered 2015-04-13: 4 mg via ORAL
  Filled 2015-04-13: qty 1

## 2015-04-13 MED ORDER — DIPHENHYDRAMINE HCL 25 MG PO CAPS: 25.0000 mg | ORAL_CAPSULE | Freq: Four times a day (QID) | ORAL | Status: DC | PRN

## 2015-04-13 MED ORDER — METHYLERGONOVINE MALEATE 0.2 MG/ML IJ SOLN: 0.2000 mg | INTRAMUSCULAR | Status: DC | PRN

## 2015-04-13 MED ORDER — PENICILLIN G POTASSIUM 5000000 UNITS IJ SOLR
2.5000 10*6.[IU] | INTRAVENOUS | Status: DC
  Filled 2015-04-13 (×3): qty 2.5

## 2015-04-13 MED ORDER — SENNOSIDES-DOCUSATE SODIUM 8.6-50 MG PO TABS
2.0000 | ORAL_TABLET | ORAL | Status: DC
  Administered 2015-04-14: 2 via ORAL
  Filled 2015-04-13: qty 2

## 2015-04-13 MED ORDER — WITCH HAZEL-GLYCERIN EX PADS: 1.0000 "application " | MEDICATED_PAD | CUTANEOUS | Status: DC | PRN

## 2015-04-13 MED ORDER — METHYLERGONOVINE MALEATE 0.2 MG PO TABS: 0.2000 mg | ORAL_TABLET | ORAL | Status: DC | PRN

## 2015-04-13 MED ORDER — DIBUCAINE 1 % RE OINT: 1.0000 "application " | TOPICAL_OINTMENT | RECTAL | Status: DC | PRN

## 2015-04-13 MED ORDER — PRENATAL MULTIVITAMIN CH
1.0000 | ORAL_TABLET | Freq: Every day | ORAL | Status: DC
  Administered 2015-04-13 – 2015-04-14 (×2): 1 via ORAL
  Filled 2015-04-13 (×2): qty 1

## 2015-04-13 MED ORDER — BISACODYL 10 MG RE SUPP
10.0000 mg | Freq: Every day | RECTAL | Status: DC | PRN
Start: 2015-04-13 — End: 2015-04-14

## 2015-04-13 MED ORDER — BENZOCAINE-MENTHOL 20-0.5 % EX AERO
1.0000 "application " | INHALATION_SPRAY | CUTANEOUS | Status: DC | PRN
  Administered 2015-04-13: 1 via TOPICAL
  Filled 2015-04-13: qty 56

## 2015-04-13 MED ORDER — IBUPROFEN 600 MG PO TABS
600.0000 mg | ORAL_TABLET | Freq: Four times a day (QID) | ORAL | Status: DC
  Administered 2015-04-13 – 2015-04-14 (×5): 600 mg via ORAL
  Filled 2015-04-13 (×6): qty 1

## 2015-04-13 MED ORDER — OXYCODONE-ACETAMINOPHEN 5-325 MG PO TABS: 2.0000 | ORAL_TABLET | ORAL | Status: DC | PRN

## 2015-04-13 MED ORDER — SIMETHICONE 80 MG PO CHEW: 80.0000 mg | CHEWABLE_TABLET | ORAL | Status: DC | PRN

## 2015-04-13 MED ORDER — OXYCODONE-ACETAMINOPHEN 5-325 MG PO TABS
1.0000 | ORAL_TABLET | ORAL | Status: DC | PRN
  Administered 2015-04-13 – 2015-04-14 (×3): 1 via ORAL
  Filled 2015-04-13 (×3): qty 1

## 2015-04-13 NOTE — Lactation Note (Signed)
This note was copied from the chart of Marissa Jennifer Payes. Lactation Consultation Note  Patient Name: Marissa Contreras [id number] Date: 04/13/2015 Reason for consult: Initial assessment  With this 24 year old mom of a term baby. Mom has a 59 year old, who she brest fed for 6 weeks - stopped due to painful latch.  I assisted mom with latching this baby in cross cradle hold, and she latched well, with rhythmic, strong suckles and visible swallows. Mom has huge breasts, that sin in her lap. I placed a blanket roll under her breast prior to latching baby. Mom was able to lay her baby in her lap, without a pillow, and breast feed, in cross cradle . Mom reports comfortable latch after a minute . Marland KitchenLactation services and breast feeding teaching done from the baby and Me book.    Maternal Data Formula Feeding for Exclusion: No Has patient been taught Hand Expression?: Yes Does the patient have breastfeeding experience prior to this delivery?: Yes  Feeding Feeding Type: Breast Fed  LATCH Score/Interventions Latch: Grasps breast easily, tongue down, lips flanged, rhythmical sucking. Intervention(s): Adjust position;Assist with latch;Breast compression  Audible Swallowing: Spontaneous and intermittent (visible swallows - colostrum expressed easily)  Type of Nipple: Everted at rest and after stimulation  Comfort (Breast/Nipple): Soft / non-tender     Hold (Positioning): Assistance needed to correctly position infant at breast and maintain latch. Intervention(s): Breastfeeding basics reviewed;Support Pillows;Position options;Skin to skin  LATCH Score: 9  Lactation Tools Discussed/Used     Consult Status Consult Status: Follow-up Date: 04/14/15 Follow-up type: In-patient    Marissa Contreras 04/13/2015, 2:35 PM

## 2015-04-13 NOTE — Progress Notes (Signed)
   Marissa Contreras is a 24 y.o. [id number] at 104w1d  admitted for induction for vaginal bleeding.  Subjective: Patient resting comfortably on her right side. No complaints at this time.   Objective: Filed Vitals:   04/13/15 0105 04/13/15 0130 04/13/15 0200 04/13/15 0230  BP: 112/63 114/68 112/90 127/77  Pulse: 95 90 102 102  Temp:      TempSrc:      Resp: 18     Height:      Weight:      SpO2:          FHT:  FHR: 125 bpm, variability: moderate,  accelerations:  Present,  decelerations:  Absent UC:   irregular, every 2-4 minutes SVE:   Dilation: 5.5 Effacement (%): 90 Station: 0 Exam by:: C.Okoroji RN Pitocin @ 16 mu/min  Labs: Lab Results  Component Value Date   WBC 10.6* 04/12/2015   HGB 9.0* 04/12/2015   HCT 29.2* 04/12/2015   MCV 77.5* 04/12/2015   PLT 250 04/12/2015    Assessment / Plan: Induction of labor due to vaginal bleeding, progressing well on pitocin  Labor: Progressing on Pitocin, will continue to increase then AROM Fetal Wellbeing:  Category I Pain Control:  Epidural Anticipated MOD:  NSVD  Marylene Land, SNM 04/13/2015, 3:34 AM

## 2015-04-13 NOTE — Progress Notes (Signed)
UR chart review completed.  

## 2015-04-13 NOTE — Progress Notes (Signed)
Post Partum Day 0 Subjective: no complaints  Objective: Blood pressure 133/69, pulse 99, temperature 98.3 F (36.8 C), temperature source Oral, resp. rate 18, height  (1.676 m), weight 85.73 kg (189 lb), last menstrual period 07/06/2014, SpO2 99 %, unknown if currently breastfeeding.  Physical Exam:  General: alert, cooperative and no distress Lochia: appropriate Uterine Fundus: firm Incision: n/a DVT Evaluation: No evidence of DVT seen on physical exam. No cords or calf tenderness. No significant calf/ankle edema.   Recent Labs  04/12/15 1608  HGB 9.0*  HCT 29.2*    Assessment/Plan: Plan for discharge tomorrow and Breastfeeding   LOS: 1 day   Marissa Contreras 04/13/2015, 9:30 AM

## 2015-04-13 NOTE — Progress Notes (Signed)
   Marissa Contreras is a 24 y.o. [phone] at [redacted]w[redacted]d  admitted for induction of labor due to vagianl bleeding.  Subjective: Comfortable with epidural  Objective: Filed Vitals:   04/13/15 0330 04/13/15 0400 04/13/15 0430 04/13/15 0442  BP: 129/76 128/72 113/64   Pulse: 108 104 100   Temp:    99.1 F (37.3 C)  TempSrc:    Oral  Resp:      Height:      Weight:      SpO2:          FHT:  FHR: 130 bpm, variability: moderate,  accelerations:  Present,  decelerations:  Absent UC:   irregular, every 2-5 minutes SVE:   Dilation: 6 Effacement (%): 90 Station: -1 Exam by:: C.Okoroji RN/K Hospital doctor Pitocin @ 20 mu/min AROM with clear fluid and IUPC placed Small amount of bright red bleeding/mucus.   Labs: Lab Results  Component Value Date   WBC 10.6* 04/12/2015   HGB 9.0* 04/12/2015   HCT 29.2* 04/12/2015   MCV 77.5* 04/12/2015   PLT 250 04/12/2015    Assessment / Plan: IOL for VB, probable small abruption.  Keep eye on bleeding/FHT. Increase pitocin prn inadequate labor  Labor: Progressing normally Fetal Wellbeing:  Category I Pain Control:  Epidural Anticipated MOD:  NSVD  CRESENZO-DISHMAN,Janmarie Smoot 04/13/2015, 5:22 AM

## 2015-04-13 NOTE — Anesthesia Postprocedure Evaluation (Signed)
  Anesthesia Post-op Note  Patient: Marissa Contreras  Procedure(s) Performed: * No procedures listed *  Patient Location: Mother/Baby  Anesthesia Type:Epidural  Level of Consciousness: awake, alert , oriented and patient cooperative  Airway and Oxygen Therapy: Patient Spontanous Breathing  Post-op Pain: none  Post-op Assessment: Post-op Vital signs reviewed, Patient's Cardiovascular Status Stable, Respiratory Function Stable, Patent Airway, No headache, No backache and Patient able to bend at knees              Post-op Vital Signs: Reviewed and stable  Last Vitals:  Filed Vitals:   04/13/15 1207  BP: 119/79  Pulse: 89  Temp: 36.8 C  Resp: 18    Complications: No apparent anesthesia complications

## 2015-04-14 MED ORDER — IBUPROFEN 600 MG PO TABS: 600.0000 mg | ORAL_TABLET | Freq: Four times a day (QID) | ORAL | Status: DC

## 2015-04-14 NOTE — Discharge Instructions (Signed)

## 2015-04-14 NOTE — Discharge Summary (Signed)
Obstetric Discharge Summary Reason for Admission: onset of labor and 3rd trimester bleeding  Prenatal Procedures: none Intrapartum Procedures: spontaneous vaginal delivery Postpartum Procedures: none Complications-Operative and Postpartum: none HEMOGLOBIN  Date Value Ref Range Status  04/12/2015 9.0* 12.0 - 15.0 g/dL Final   HCT  Date Value Ref Range Status  04/12/2015 29.2* 36.0 - 46.0 % Final    Physical Exam:  General: alert, cooperative and no distress Lochia: appropriate Uterine Fundus: firm Incision: n/a DVT Evaluation: No evidence of DVT seen on physical exam. No cords or calf tenderness. No significant calf/ankle edema.  Discharge Diagnoses: Term Pregnancy-delivered  Discharge Information: Date: 04/14/2015 Activity: pelvic rest Diet: routine Medications: PNV and Ibuprofen Condition: stable Instructions: refer to practice specific booklet Discharge to: home Follow-up Information    Follow up with Beverely Low, MD On 05/09/2015.   Specialty:  Family Medicine   Why:  Postpartum appointment scheduled at [facility] information:   [address] [place] [address] [phone]       Newborn Data: Live born female  Birth Weight: 8 lb 9.4 oz (3895 g) APGAR: 8, 10  Home with mother  Planning nexplanon for [facility] to be placed outpatient.  Beverely Low 04/14/2015, 11:04 AM

## 2015-04-14 NOTE — Lactation Note (Signed)
This note was copied from the chart of Marissa Contreras. Lactation Consultation Note  Patient Name: Marissa Chauna Osoria [id number] Date: 04/14/2015 Reason for consult: Follow-up assessment  Baby is for D/C today and has been consistent at the breast , voids and stools adequate for age. Bili WNL. Per mom baby cluster fed all night and I didn't sleep much. Per mom small blister on right nipple. LC assessed breast tissue with moms permission, blister not present may have reaborbed.  Nipples appear healthy pink , intact skin . LC reviewed basics - prior to latch - breast massage , hand express, Pre-pump if needed with hand pump, latch with firm support and showed mom hoe to obtain depth at the breast.  And breast compressions with latch and during feeding . Multiply swallows noted. Baby fed 10 mins , and fell asleep.  Baby relaxed and fell asleep after mom released suction. LC showed mom how to use hand pump, comfort gels , and shells. Sore nipple and engorgement prevention reviewed.  Mother informed of post-discharge support and given phone number to the lactation department, including services for phone call assistance; out-patient appointments; and breastfeeding support group. List of other breastfeeding resources in the community given in the handout. Encouraged mother to call for problems or concerns related to breastfeeding.    Maternal Data Has patient been taught Hand Expression?: Yes (right breast with several drops of colostrum )  Feeding Feeding Type: Breast Fed Length of feed: 10 min  LATCH Score/Interventions Latch: Grasps breast easily, tongue down, lips flanged, rhythmical sucking. Intervention(s): Adjust position;Assist with latch;Breast massage;Breast compression  Audible Swallowing: Spontaneous and intermittent (increased with breast compressions )  Type of Nipple: Everted at rest and after stimulation  Comfort (Breast/Nipple): Soft / non-tender     Hold (Positioning):  Assistance needed to correctly position infant at breast and maintain latch. Intervention(s): Breastfeeding basics reviewed;Support Pillows;Position options;Skin to skin  LATCH Score: 9  Lactation Tools Discussed/Used Tools: Shells;Pump;Comfort gels Shell Type: Inverted Breast pump type: Manual WIC Program: No Pump Review: Setup, frequency, and cleaning Initiated by:: MAI  Date initiated:: 04/14/15   Consult Status Consult Status: Complete Date: 04/14/15 Follow-up type: In-patient    Kathrin Greathouse 04/14/2015, 2:39 PM

## 2015-04-27 ENCOUNTER — Other Ambulatory Visit: Payer: Self-pay | Admitting: Family Medicine

## 2015-04-27 DIAGNOSIS — Z832 Family history of diseases of the blood and blood-forming organs and certain disorders involving the immune mechanism: Secondary | ICD-10-CM

## 2015-05-09 ENCOUNTER — Ambulatory Visit (INDEPENDENT_AMBULATORY_CARE_PROVIDER_SITE_OTHER): Payer: Medicaid Other | Admitting: Family Medicine

## 2015-05-09 ENCOUNTER — Encounter: Payer: Self-pay | Admitting: Family Medicine

## 2015-05-09 VITALS — BP 117/65 | HR 70 | Temp 98.3°F | Ht 66.0 in | Wt 164.0 lb

## 2015-05-09 DIAGNOSIS — Z30019 Encounter for initial prescription of contraceptives, unspecified: Secondary | ICD-10-CM | POA: Diagnosis not present

## 2015-05-09 DIAGNOSIS — Z3042 Encounter for surveillance of injectable contraceptive: Secondary | ICD-10-CM | POA: Diagnosis present

## 2015-05-09 DIAGNOSIS — Z30018 Encounter for initial prescription of other contraceptives: Secondary | ICD-10-CM

## 2015-05-09 DIAGNOSIS — Z30017 Encounter for initial prescription of implantable subdermal contraceptive: Secondary | ICD-10-CM

## 2015-05-09 MED ORDER — ETONOGESTREL 68 MG ~~LOC~~ IMPL
68.0000 mg | DRUG_IMPLANT | Freq: Once | SUBCUTANEOUS | Status: AC
Start: 2015-05-09 — End: 2015-05-09
  Administered 2015-05-09: 68 mg via SUBCUTANEOUS

## 2015-05-09 NOTE — Patient Instructions (Signed)
Etonogestrel implant What is this medicine? ETONOGESTREL (et oh noe JES trel) is a contraceptive (birth control) device. It is used to prevent pregnancy. It can be used for up to 3 years. This medicine may be used for other purposes; ask your health care provider or pharmacist if you have questions. COMMON BRAND NAME(S): Implanon, Nexplanon What should I tell my health care provider before I take this medicine? They need to know if you have any of these conditions: -abnormal vaginal bleeding -blood vessel disease or blood clots -cancer of the breast, cervix, or liver -depression -diabetes -gallbladder disease -headaches -heart disease or recent heart attack -high blood pressure -high cholesterol -kidney disease -liver disease -renal disease -seizures -tobacco smoker -an unusual or allergic reaction to etonogestrel, other hormones, anesthetics or antiseptics, medicines, foods, dyes, or preservatives -pregnant or trying to get pregnant -breast-feeding How should I use this medicine? This device is inserted just under the skin on the inner side of your upper arm by a health care professional. Talk to your pediatrician regarding the use of this medicine in children. Special care may be needed. Overdosage: If you think you've taken too much of this medicine contact a poison control center or emergency room at once. Overdosage: If you think you have taken too much of this medicine contact a poison control center or emergency room at once. NOTE: This medicine is only for you. Do not share this medicine with others. What if I miss a dose? This does not apply. What may interact with this medicine? Do not take this medicine with any of the following medications: -amprenavir -bosentan -fosamprenavir This medicine may also interact with the following medications: -barbiturate medicines for inducing sleep or treating seizures -certain medicines for fungal infections like ketoconazole and  itraconazole -griseofulvin -medicines to treat seizures like carbamazepine, felbamate, oxcarbazepine, phenytoin, topiramate -modafinil -phenylbutazone -rifampin -some medicines to treat HIV infection like atazanavir, indinavir, lopinavir, nelfinavir, tipranavir, ritonavir -St. John's wort This list may not describe all possible interactions. Give your health care provider a list of all the medicines, herbs, non-prescription drugs, or dietary supplements you use. Also tell them if you smoke, drink alcohol, or use illegal drugs. Some items may interact with your medicine. What should I watch for while using this medicine? This product does not protect you against HIV infection (AIDS) or other sexually transmitted diseases. You should be able to feel the implant by pressing your fingertips over the skin where it was inserted. Tell your doctor if you cannot feel the implant. What side effects may I notice from receiving this medicine? Side effects that you should report to your doctor or health care professional as soon as possible: -allergic reactions like skin rash, itching or hives, swelling of the face, lips, or tongue -breast lumps -changes in vision -confusion, trouble speaking or understanding -dark urine -depressed mood -general ill feeling or flu-like symptoms -light-colored stools -loss of appetite, nausea -right upper belly pain -severe headaches -severe pain, swelling, or tenderness in the abdomen -shortness of breath, chest pain, swelling in a leg -signs of pregnancy -sudden numbness or weakness of the face, arm or leg -trouble walking, dizziness, loss of balance or coordination -unusual vaginal bleeding, discharge -unusually weak or tired -yellowing of the eyes or skin Side effects that usually do not require medical attention (Report these to your doctor or health care professional if they continue or are bothersome.): -acne -breast pain -changes in  weight -cough -fever or chills -headache -irregular menstrual bleeding -itching, burning, and   vaginal discharge -pain or difficulty passing urine -sore throat This list may not describe all possible side effects. Call your doctor for medical advice about side effects. You may report side effects to FDA at 1-800-FDA-1088. Where should I keep my medicine? This drug is given in a hospital or clinic and will not be stored at home. NOTE: This sheet is a summary. It may not cover all possible information. If you have questions about this medicine, talk to your doctor, pharmacist, or health care provider.  2015, Elsevier/Gold Standard. (2012-03-15 15:37:45)  

## 2015-05-10 NOTE — Progress Notes (Signed)
Patient ID: Marissa Contreras, female   DOB: 10/07/1990, 24 y.o.   MRN: [id number]  Subjective:    Marissa Contreras is a 24 y.o. [phone] Caucasian female who presents for a postpartum visit. She is 3 weeks postpartum following a spontaneous vaginal delivery. I have fully reviewed the prenatal and intrapartum course. The delivery was at 40 gestational weeks. Outcome: spontaneous vaginal delivery. Anesthesia: epidural. Postpartum course has been uncomplicated. Baby's course has been complicated by delay in regaining birth weight and abnormal newborn screen. Baby is feeding by both breast and bottle. Bleeding no bleeding. Bowel function is normal. Bladder function is normal. Patient is not sexually active. Contraception method is Nexplanon.   The following portions of the patient's history were reviewed and updated as appropriate: allergies, current medications, past medical history, past surgical history and problem list.  Review of Systems Pertinent items are noted in HPI.   Filed Vitals:   05/09/15 1633  BP: 117/65  Pulse: 70  Temp: 98.3 F (36.8 C)  TempSrc: Oral  Height:  (1.676 m)  Weight: 164 lb (74.39 kg)    Objective:     General:  alert, cooperative and no distress   Breasts:  deferred, no complaints  Lungs: clear to auscultation bilaterally  Heart:  regular rate and rhythm  Abdomen: soft, nontender   Vulva: normal  Vagina: normal vagina  Cervix:  closed  Corpus: Well-involuted  Adnexa:  Non-palpable          Assessment:   normal postpartum exam 3 wks s/p NSVD Contraception counseling   Plan:   Contraception: Nexplanon inserted today Follow up in: 1 year for well woman exam or as needed.   PROCEDURE NOTE: NEXPLANON  INSERTION Patient given informed consent and signed copy in the chart.  Pregnancy test was not done given expectation of false positive so soon after delivery  Appropriate time out was taken. Left arm was prepped and draped in the usual sterile fashion.  Appropriate measurement was made for insertion of nexplanon and landmarks identified, insertion site marked. Two cc of 1%lidocaine  was used for local anesthesia. Once anesthesia obtained, nexplanon was inserted in typical fashion. No complications. Pressure bandage applied to decrease bruising. Patient given follow up instructions should she experience redness, swelling at sight or fever in the next 24 hours. Patient given Nexplanon pocket card.

## 2015-06-18 ENCOUNTER — Ambulatory Visit: Payer: Self-pay

## 2015-06-18 NOTE — Lactation Note (Addendum)
This note was copied from the chart of Marissa Contreras. Lactation Consultation Note  Spoke with Haley's mother,Meko Brod to get a lactation history.  Supplementation with formula was started one week after birth related to poor weight gain.  Baby was stooling 10 x in 24 hours and the stools were dark, runny and contained mucous.  She was eating every 6 hours because she was too tired to eat. Mom reports that she incrementally cut dairy,soy, and wheat from her diet and the baby's stools eventually transitioned to yellow.  Baby was fully taken off of formula at one month and exclusively BF.  Rolly Salter is feed 10 times in 24 hours.  Mom hand expresses after every feeding and also post-pumps but stopped recently related to nipple trauma. Mom reported to me that [facility] usually feeds on one breast while she pumps the other side.  Rolly Salter only takes the supplement of expressed BM 1-2 times in a 24 hour period.  Pre and post weights today reveal that she eats just over 1 oz while at the breast. She has had 3.7 oz from [id number].  Feedings take about 35-40 minutes.  Rolly Salter spends a lot of time at the breast and is not transferring much milk.  Mom reports that Rolly Salter is chewing at the breast and her inpatient lactation notes state that she needed support to stay on the breast because she could not maintain depth.  These are 2 indicators that oral restrictions may be present.  Recommendation is for [facility] to eat on both breasts at each feeding and for mom to post pump and try to feed the milk back to [place]. Evaluate for posterior or submucosal tongue tie. This assessment was communicate to Dr. Loni Muse.After speaking with her it was decided for [facility] to be fed with a special needs feeder.  Patient Name: Marissa Contreras [id number] Date: 06/18/2015     Maternal Data    Feeding Feeding Type: Breast Fed  LATCH Score/Interventions                      Lactation Tools Discussed/Used     Consult  Status      Soyla Dryer 06/18/2015, 1:51 PM

## 2015-06-19 ENCOUNTER — Telehealth: Payer: Self-pay | Admitting: Family Medicine

## 2015-06-19 ENCOUNTER — Other Ambulatory Visit: Payer: Self-pay | Admitting: Family Medicine

## 2015-06-19 ENCOUNTER — Ambulatory Visit: Payer: Self-pay

## 2015-06-19 ENCOUNTER — Other Ambulatory Visit (INDEPENDENT_AMBULATORY_CARE_PROVIDER_SITE_OTHER): Payer: Medicaid Other

## 2015-06-19 DIAGNOSIS — O924 Hypogalactia: Secondary | ICD-10-CM

## 2015-06-19 LAB — POCT URINE PREGNANCY: PREG TEST UR: NEGATIVE

## 2015-06-19 NOTE — Lactation Note (Signed)
This note was copied from the chart of Marissa Contreras. Lactation Consultation Note: I phoned mother back to ask if she thought that she could be pregnant. She states that she is unsure that it could be possible. Advised mother to do pregnancy test when possible.   Patient Name: Marissa Contreras [id number] Date: 06/19/2015     Maternal Data    Feeding Feeding Type: Bottle Fed - Breast Milk Length of feed: 24 min  LATCH Score/Interventions                      Lactation Tools Discussed/Used     Consult Status      Michel Bickers 06/19/2015, 7:04 PM

## 2015-06-19 NOTE — Lactation Note (Signed)
This note was copied from the chart of Marissa Contreras. Called and spoke with [facility] nurse, Maralyn Sago. She was busy and is planning to call me back. Called and spoke with Aldine Contes, [facility] who is going to Peds this evening for consult.

## 2015-06-19 NOTE — Telephone Encounter (Signed)
The lactation specialist suggested pt take a pregnancy test because of her milk being low. Mother asked for a blood test to be done at the hospital.   Please advise

## 2015-06-19 NOTE — Lactation Note (Signed)
This note was copied from the chart of Marissa Contreras. Lactation Consultation Note: Dr Chanetta Marshall returned phone call and tried to contact [facility] to give an update on infant status. I was seeing outpatients and was unable to return her call. I ask Noralee Stain to follow up with a phone call to Dr Chanetta Marshall .   Patient Name: Marissa Contreras [id number] Date: 06/19/2015     Maternal Data    Feeding Feeding Type: Bottle Fed - Breast Milk Length of feed: 24 min  LATCH Score/Interventions                      Lactation Tools Discussed/Used     Consult Status      Michel Bickers 06/19/2015, 7:07 PM

## 2015-06-19 NOTE — Lactation Note (Signed)
This note was copied from the chart of [facility]. LC phoned mother of patient in room 6114.  Mother states that she pumped her breast at 3:30 this am and at 9;00 am with 30 ml of ebm obtained with each pumping. Mother states that she is giving a bottle to infant now with 3 ounces. Mother states that she has been breastfeeding on one breast while pumping on the alternate breast. Mother states she has a Freemie pump at home with flanges that are too small and have been causing pain. Mother states she stopped pumping her breast several days ago. Mother states that she plans to order new flanges when she gets paid.  Mother is active with WIC in [place]. She states that [facility] plans to give her an electric pump .  I discussed dieticians  plan for mother to offer fortified breastmilk after breastfeeding. Mother informed me that she doesn't want to use formula due to infant having reaction of diarrhea, spitting and very gassy .   Discussed importance of consistent pumping with hospital grade pump.  Advised mother to breastfeed , supplement infant with EBM/Formula. Suggested trying to get infant to take at least 3-4 ounces every 2-3 hours.  Mother to post pump for 20 mins after each feeding. Suggested that mother double pump with good breast massage.   Advised to focus on infants feeding and do good breast compression when breastfeeding.  Mother advised to have nurses to follow up with pre and post weights.  Mother receptive to all teaching.

## 2015-06-19 NOTE — Lactation Note (Signed)
This note was copied from the chart of Marissa Contreras. Lactation Consultation Note: I phoned Haley's nurse, Sarah, to update my conversation with the mother. We discussed at length the plan that mother was to follow and advised to do pre and post weights with each feeding.   Patient Name: Marissa Contreras [id number] Date: 06/19/2015     Maternal Data    Feeding Feeding Type: Bottle Fed - Breast Milk Length of feed: 24 min  LATCH Score/Interventions                      Lactation Tools Discussed/Used     Consult Status      Michel Bickers 06/19/2015, 7:10 PM

## 2015-06-19 NOTE — Lactation Note (Signed)
This note was copied from the chart of Marissa Contreras. Lactation Consultation Note: Dr Loni Muse phoned [facility] office and left a message regarding plan for patient. I phoned back on pager with no return page at this time.    Patient Name: Marissa Contreras [id number] Date: 06/19/2015     Maternal Data    Feeding Feeding Type: Bottle Fed - Breast Milk Length of feed: 24 min  LATCH Score/Interventions                      Lactation Tools Discussed/Used     Consult Status      Michel Bickers 06/19/2015, 6:43 PM

## 2015-06-19 NOTE — Lactation Note (Signed)
This note was copied from the chart of Marissa Contreras. Lactation Consultation Note  Paged Loni Muse, Med Student. No response received. Called [phone] and spoke with Sunday Corn, resident. Dr. Nancy Marus said that the mother seems to be having trouble understanding how often to feed the infant and that the baby is not staying on the breast well and is sleeping a lot. Dr. Nancy Marus reports the baby has been receiving some pumped breast milk although amounts not documented in the chart. Doctors plan to reevaluate today and decide on supplementing with formula. Up until now mom is declining formula and milk fortifier. Mom voiced concerns to doctors that she has tried 3 different formulas in the past for 2 days each and baby had diarrhea. Baby had 12 + BF documented in last 24 hours with 3 stools (green, brown, and yellow) and 2 voids. Pre-post weights show <1 oz-3 oz gains post BF.  Patient Name: Marissa Contreras [id number] Date: 06/19/2015     Maternal Data    Feeding Feeding Type: Breast Milk Length of feed: 25 min  LATCH Score/Interventions                      Lactation Tools Discussed/Used     Consult Status      Ed Blalock 06/19/2015, 5:15 PM

## 2015-06-19 NOTE — Lactation Note (Signed)
This note was copied from the chart of Marissa Contreras. Lactation Consultation:  Physically mom's breasts look and feel like milk production should be abundant.  She has large well developed breasts with palpable glandular tissue.  Nipples are intact though they are tender at times.  Flange size #21 on the left and #24 on the right.  They tissue moves freely but she was advised that if she needed to lubricate with vaseline or lanolin, which she has it was acceptable.  Mom expressed 45 ml of milk.  Milk transitioned from Foremilk to hind milk.   It was explained to Parents that goals needed to be feed the baby and increase her milk supply.  Her perception is that [facility] breast feeds well and mentioned that her LATCH score of 9 post-partum.  It was explained to her that babies can start out feeding well because the mother's body can work so well it delivers the milk to the baby.  Parents agreed to re-introduce formula to [place].  There is some concern about tolerance by parents so Alimentum was agreeable with them. Oral exam:  Upper labial frenum is wide and inserts at the alveolar ridge. Anterior bubble palate noted. Tongue lateralizes well but there is posterior ongue humping . Parents also report that when [facility] cries the tongue elevates laterally but not the anterior portion.  These findings are suggestive of oral restrictions.  Parents were given resources of [email or web address] and [facility] to educate themselves on oral restrictions.   The expressed BM was fed to [facility] using a Special Needs Feeder.  Cheeks squeezes were used to help with the intraoral vacuum.  Even with this maneuver she tired while eating and quivering was noted in the jaw from fatigue.  Mom was shown how to compress the chamber to make it easier for [facility] to transfer.  Perhaps oral muscle strength will increase with this device.  Plan for Rolly Salter is:  Feed 3 oz every 2 hours via Special Needs Feeder Compress the chamber if it is  easier for [facility]. Watch for long jaw movements. Always feed breast milk first and then make up the rest of the volume with Alimentum Pump every 2 hours. Power pumping if mom desires. Pump 10 minutes rest 10 minutes and repeat cycle for 60 minutes Mom can sleep for 4 hours overnight and grandma can feed expressed breast milk if available or formula

## 2015-07-17 ENCOUNTER — Encounter: Payer: Self-pay | Admitting: Family Medicine

## 2015-10-21 ENCOUNTER — Emergency Department (HOSPITAL_COMMUNITY)
Admission: EM | Admit: 2015-10-21 | Discharge: 2015-10-21 | Disposition: A | Discharge status: Home / Self Care | Payer: Medicaid Other | Attending: Emergency Medicine | Admitting: Emergency Medicine

## 2015-10-21 ENCOUNTER — Emergency Department (HOSPITAL_COMMUNITY): Payer: Medicaid Other

## 2015-10-21 ENCOUNTER — Encounter (HOSPITAL_COMMUNITY): Payer: Self-pay | Admitting: *Deleted

## 2015-10-21 DIAGNOSIS — Z7952 Long term (current) use of systemic steroids: Secondary | ICD-10-CM | POA: Insufficient documentation

## 2015-10-21 DIAGNOSIS — J45909 Unspecified asthma, uncomplicated: Secondary | ICD-10-CM | POA: Insufficient documentation

## 2015-10-21 DIAGNOSIS — Z79899 Other long term (current) drug therapy: Secondary | ICD-10-CM | POA: Insufficient documentation

## 2015-10-21 DIAGNOSIS — Z8619 Personal history of other infectious and parasitic diseases: Secondary | ICD-10-CM | POA: Insufficient documentation

## 2015-10-21 DIAGNOSIS — M5442 Lumbago with sciatica, left side: Secondary | ICD-10-CM

## 2015-10-21 DIAGNOSIS — Z3202 Encounter for pregnancy test, result negative: Secondary | ICD-10-CM | POA: Insufficient documentation

## 2015-10-21 DIAGNOSIS — G8929 Other chronic pain: Secondary | ICD-10-CM | POA: Insufficient documentation

## 2015-10-21 DIAGNOSIS — Z87891 Personal history of nicotine dependence: Secondary | ICD-10-CM | POA: Insufficient documentation

## 2015-10-21 LAB — URINALYSIS, ROUTINE W REFLEX MICROSCOPIC
Bilirubin Urine: NEGATIVE
Glucose, UA: NEGATIVE mg/dL
HGB URINE DIPSTICK: NEGATIVE
Ketones, ur: NEGATIVE mg/dL
Leukocytes, UA: NEGATIVE
Nitrite: NEGATIVE
PH: 6 (ref 5.0–8.0)
PROTEIN: NEGATIVE mg/dL
SPECIFIC GRAVITY, URINE: 1.025 (ref 1.005–1.030)

## 2015-10-21 LAB — POC URINE PREG, ED: Preg Test, Ur: NEGATIVE

## 2015-10-21 MED ORDER — IBUPROFEN 600 MG PO TABS: 600.0000 mg | ORAL_TABLET | Freq: Three times a day (TID) | ORAL | Status: DC

## 2015-10-21 MED ORDER — HYDROCODONE-ACETAMINOPHEN 5-325 MG PO TABS
1.0000 | ORAL_TABLET | Freq: Once | ORAL | Status: AC
  Administered 2015-10-21: 1 via ORAL
  Filled 2015-10-21: qty 1

## 2015-10-21 MED ORDER — HYDROCODONE-ACETAMINOPHEN 5-325 MG PO TABS: ORAL_TABLET | ORAL | Status: DC

## 2015-10-21 MED ORDER — IBUPROFEN 800 MG PO TABS
800.0000 mg | ORAL_TABLET | Freq: Once | ORAL | Status: AC
  Administered 2015-10-21: 800 mg via ORAL
  Filled 2015-10-21: qty 1

## 2015-10-21 MED ORDER — CYCLOBENZAPRINE HCL 10 MG PO TABS: 10.0000 mg | ORAL_TABLET | Freq: Three times a day (TID) | ORAL | Status: DC | PRN

## 2015-10-21 NOTE — Discharge Instructions (Signed)
°  Sciatica °Sciatica is pain, weakness, numbness, or tingling along your sciatic nerve. The nerve starts in the lower back and runs down the back of each leg. Nerve damage or certain conditions pinch or put pressure on the sciatic nerve. This causes the pain, weakness, and other discomforts of sciatica. °HOME CARE  °· Only take medicine as told by your doctor. °· Apply ice to the affected area for 20 minutes. Do this 3-4 times a day for the first 48-72 hours. Then try heat in the same way. °· Exercise, stretch, or do your usual activities if these do not make your pain worse. °· Go to physical therapy as told by your doctor. °· Keep all doctor visits as told. °· Do not wear high heels or shoes that are not supportive. °· Get a firm mattress if your mattress is too soft to lessen pain and discomfort. °GET HELP RIGHT AWAY IF:  °· You cannot control when you poop (bowel movement) or pee (urinate). °· You have more weakness in your lower back, lower belly (pelvis), butt (buttocks), or legs. °· You have redness or puffiness (swelling) of your back. °· You have a burning feeling when you pee. °· You have pain that gets worse when you lie down. °· You have pain that wakes you from your sleep. °· Your pain is worse than past pain. °· Your pain lasts longer than 4 weeks. °· You are suddenly losing weight without reason. °MAKE SURE YOU:  °· Understand these instructions. °· Will watch this condition. °· Will get help right away if you are not doing well or get worse. °  °This information is not intended to replace advice given to you by your health care provider. Make sure you discuss any questions you have with your health care provider. °  °Document Released: 06/17/2008 Document Revised: 05/30/2015 Document Reviewed: 01/18/2012 °Elsevier Interactive Patient Education ©2016 Elsevier Inc. ° ° °

## 2015-10-21 NOTE — ED Provider Notes (Signed)
CSN: [id number]     Arrival date & time 10/21/15  1424 History  By signing my name below, I, Marissa Contreras, attest that this documentation has been prepared under the direction and in the presence of Marissa Sumler, PA-C. Electronically Signed: Doreatha Contreras, ED Scribe. 10/21/2015. 3:09 PM.    Chief Complaint  Patient presents with  . Back Pain   The history is provided by the patient. No language interpreter was used.    HPI Comments: Marissa Contreras is a 25 y.o. female with h/o chronic back pain, sciatica who presents to the Emergency Department complaining of moderate, progressively worsening acute on chronic left lower back pain with radiation into the left leg onset last week. Pt states she woke up with the pain. Pt denies recent trauma, injury, heavy lifting, falls, twisting, bending. Pt states that pain is worsened with extension of the back and legs, and relieved with rest. She states this pain is similar to h/o sciatica, but more severe. She reports that she has taken Tylenol, ibuprofen with no relief. Pt states her last XR was 19 months ago. She denies bowel or bladder incontinence, dysuria, difficulty urinating, fever, chills, emesis, nausea, numbness, paresthesia, focal weakness, abdominal pain, arthralgias.     Past Medical History  Diagnosis Date  . H/O varicella   . Back pain   . Chronic back pain   . Asthma    Past Surgical History  Procedure Laterality Date  . Tonsillectomy    . Dilation and evacuation N/A 03/08/2014    Procedure: DILATATION AND EVACUATION ;  Surgeon: Purcell Nails, MD;  Location: [facility];  Service: Gynecology;  Laterality: N/A;  with chromosomal studies, sent    Family History  Problem Relation Age of Onset  . Hypertension Father   . Diabetes Father   . Fibromyalgia Mother   . Arthritis Mother   . Depression Mother   . Diabetes Paternal Grandmother   . Cancer Paternal Grandmother   . Stroke Paternal Grandmother    Social History  Substance Use Topics   . Smoking status: Former Smoker    Types: Cigarettes    Quit date: 11/21/2011  . Smokeless tobacco: Never Used     Comment: e-cig  . Alcohol Use: No   OB History    Gravida Para Term Preterm AB TAB SAB Ectopic Multiple Living   0 2     Review of Systems  Constitutional: Negative for fever and chills.  Gastrointestinal: Negative for nausea, vomiting and abdominal pain.  Genitourinary: Negative for dysuria and difficulty urinating.  Musculoskeletal: Positive for myalgias and back pain. Negative for arthralgias.  Neurological: Negative for weakness and numbness.   Allergies  Review of patient's allergies indicates no known allergies.  Home Medications   Prior to Admission medications   Medication Sig Start Date End Date Taking? Authorizing Provider  acetaminophen (TYLENOL) 500 MG tablet Take 1,000 mg by mouth every 6 (six) hours as needed for mild pain, moderate pain or headache.     Historical Provider, MD  albuterol (PROVENTIL HFA;[name] 108 (90 BASE) MCG/ACT inhaler Inhale 2 puffs into the lungs every 6 (six) hours as needed for wheezing or shortness of breath.    Historical Provider, MD  hydrocortisone cream 1 % Apply 1 application topically 2 (two) times daily.    Historical Provider, MD  ibuprofen (ADVIL,MOTRIN) 600 MG tablet Take 1 tablet (600 mg total) by mouth every 6 (six) hours. 04/14/15  Marissa Sander, MD  Prenatal Vit-Fe Fumarate-FA (PRENATAL MULTIVITAMIN) TABS tablet Take 1 tablet by mouth daily at 12 noon.    Historical Provider, MD   BP 124/74 mmHg  Pulse 89  Temp(Src) 98.6 F (37 C) (Tympanic)  Resp 16  Ht  (1.676 m)  Wt 162 lb (73.483 kg)  BMI 26.16 kg/m2  SpO2 99%  LMP 06/21/2015 Physical Exam  Constitutional: She is oriented to person, place, and time. She appears well-developed and well-nourished.  HENT:  Head: Normocephalic and atraumatic.  Eyes: Conjunctivae and EOM are normal. Pupils are equal, round, and reactive to light.   Neck: Normal range of motion. Neck supple.  Cardiovascular: Normal rate, regular rhythm and normal heart sounds.  Exam reveals no gallop and no friction rub.   No murmur heard. Pulmonary/Chest: Effort normal and breath sounds normal. No respiratory distress. She has no wheezes. She has no rales.  Abdominal: Soft. Bowel sounds are normal. She exhibits no distension. There is no tenderness.  Musculoskeletal: Normal range of motion. She exhibits tenderness.  Tender to palpation at the lower lumbar spine and left paraspinal muscles. SLR positive bilaterally at 15 degrees.   Neurological: She is alert and oriented to person, place, and time.  Motor and sensation equal and intact bilaterally. No gait abnormality. NVI. 5/5 strength in bilateral upper and lower extremities.   Skin: Skin is warm and dry.  Psychiatric: She has a normal mood and affect. Her behavior is normal.  Nursing note and vitals reviewed.   ED Course  Procedures (including critical care time) DIAGNOSTIC STUDIES: Oxygen Saturation is 99% on RA, normal by my interpretation.    COORDINATION OF CARE: 3:05 PM Discussed treatment plan with pt at bedside which includes XR, UA and pt agreed to plan.   Labs Review Labs Reviewed  URINALYSIS, ROUTINE W REFLEX MICROSCOPIC (NOT AT [facility])  POC URINE PREG, ED    Imaging Review Dg Lumbar Spine Complete  10/21/2015  CLINICAL DATA:  Low back pain.  No known injury. EXAM: LUMBAR SPINE - COMPLETE 4+ VIEW COMPARISON:  None. FINDINGS: There is no evidence of lumbar spine fracture. Alignment is normal. Intervertebral disc spaces are maintained. No other significant bone abnormality identified. IMPRESSION: Negative. Electronically Signed   By: Myles Rosenthal M.D.   On: 10/21/2015 16:07   I have personally reviewed and evaluated these images and lab results as part of my medical decision-making.  MDM   Final diagnoses:  Left-sided low back pain with left-sided sciatica    Patient with acute  on chronic lower back pain.  No neurological deficits and normal neuro exam.  Patient is ambulatory.  No loss of bowel or bladder control.  No concern for cauda equina. No red flags. No fever, night sweats, weight loss, h/o cancer, IVDA, no recent procedure to back. No urinary symptoms suggestive of UTI. UA negative. Supportive care and return precaution discussed. Appears safe for discharge at this time. Follow up as indicated in discharge paperwork.    I personally performed the services described in this documentation, which was scribed in my presence. The recorded information has been reviewed and is accurate.   Pauline Aus, PA-C 10/22/15 1628  Vanetta Mulders, MD 10/23/15 [phone]

## 2015-10-21 NOTE — ED Notes (Signed)
Pt c/o worsening back pain over past week. States that L4-L5 are "stuck" and has had PT for it and had to d/c due to pregnancy 3 yrs ago.

## 2015-11-28 ENCOUNTER — Encounter (HOSPITAL_COMMUNITY): Payer: Self-pay | Admitting: Emergency Medicine

## 2015-11-28 ENCOUNTER — Emergency Department (HOSPITAL_COMMUNITY)
Admission: EM | Admit: 2015-11-28 | Discharge: 2015-11-28 | Disposition: A | Discharge status: Home / Self Care | Payer: Medicaid Other | Attending: Emergency Medicine | Admitting: Emergency Medicine

## 2015-11-28 DIAGNOSIS — R51 Headache: Secondary | ICD-10-CM | POA: Insufficient documentation

## 2015-11-28 DIAGNOSIS — Z87891 Personal history of nicotine dependence: Secondary | ICD-10-CM | POA: Insufficient documentation

## 2015-11-28 DIAGNOSIS — G44229 Chronic tension-type headache, not intractable: Secondary | ICD-10-CM

## 2015-11-28 DIAGNOSIS — J45909 Unspecified asthma, uncomplicated: Secondary | ICD-10-CM | POA: Insufficient documentation

## 2015-11-28 MED ORDER — ISOMETHEPTENE-DICHLORAL-APAP 65-100-325 MG PO CAPS: 1.0000 | ORAL_CAPSULE | Freq: Once | ORAL | Status: DC

## 2015-11-28 MED ORDER — HYDROCODONE-ACETAMINOPHEN 5-325 MG PO TABS: ORAL_TABLET | ORAL | Status: DC

## 2015-11-28 MED ORDER — KETOROLAC TROMETHAMINE 60 MG/2ML IM SOLN
60.0000 mg | Freq: Once | INTRAMUSCULAR | Status: AC
  Administered 2015-11-28: 60 mg via INTRAMUSCULAR
  Filled 2015-11-28: qty 2

## 2015-11-28 NOTE — ED Notes (Signed)
[facility], [facility] not available.

## 2015-11-28 NOTE — Discharge Instructions (Signed)

## 2015-11-28 NOTE — ED Notes (Signed)
Pt c/o headaches x 5 years. She states she cannot stay awake and denies any injury.

## 2015-12-01 NOTE — ED Provider Notes (Signed)
CSN: [id number]     Arrival date & time 11/28/15  1854 History   First MD Initiated Contact with Patient 11/28/15 1921     Chief Complaint  Patient presents with  . Headache     (Consider location/radiation/quality/duration/timing/severity/associated sxs/prior Treatment) HPI   Marissa Contreras is a 25 y.o. female who presents to the Emergency Department complaining of recurrent headaches for 5 years.  She states that she has headaches nearly every day and describes the headache as a throbbing sensation around her head.  Headaches intermittent and not associated with fever, neck pain or stiffness, numbness or weakness or photophobia.  She states that she has small children at home and feels tired and sleepy all the time.  She has not seen here PMD for these symptoms.  Taken tylenol without relief.  Past Medical History  Diagnosis Date  . H/O varicella   . Back pain   . Chronic back pain   . Asthma    Past Surgical History  Procedure Laterality Date  . Tonsillectomy    . Dilation and evacuation N/A 03/08/2014    Procedure: DILATATION AND EVACUATION ;  Surgeon: Purcell NailsAngela Y Roberts, MD;  Location: [facility];  Service: Gynecology;  Laterality: N/A;  with chromosomal studies, sent    Family History  Problem Relation Age of Onset  . Hypertension Father   . Diabetes Father   . Fibromyalgia Mother   . Arthritis Mother   . Depression Mother   . Diabetes Paternal Grandmother   . Cancer Paternal Grandmother   . Stroke Paternal Grandmother    Social History  Substance Use Topics  . Smoking status: Former Smoker    Types: Cigarettes    Quit date: 11/21/2011  . Smokeless tobacco: Never Used     Comment: e-cig  . Alcohol Use: No   OB History    Gravida Para Term Preterm AB TAB SAB Ectopic Multiple Living   5 2 2  3  3   0 2     Review of Systems  Constitutional: Negative for fever, activity change and appetite change.  HENT: Negative for facial swelling and trouble swallowing.   Eyes:  Positive for photophobia. Negative for pain and visual disturbance.  Respiratory: Negative for shortness of breath.   Cardiovascular: Negative for chest pain.  Gastrointestinal: Negative for nausea and vomiting.  Musculoskeletal: Negative for neck pain and neck stiffness.  Skin: Negative for rash and wound.  Neurological: Positive for headaches. Negative for dizziness, facial asymmetry, speech difficulty, weakness and numbness.  Psychiatric/Behavioral: Negative for confusion and decreased concentration.  All other systems reviewed and are negative.     Allergies  Review of patient's allergies indicates no known allergies.  Home Medications   Prior to Admission medications   Medication Sig Start Date End Date Taking? Authorizing Provider  acetaminophen (TYLENOL) 500 MG tablet Take 1,000 mg by mouth every 6 (six) hours as needed for mild pain, moderate pain or headache.     Historical Provider, MD  Biotin 300 MCG TABS Take 300 mcg by mouth daily.    Historical Provider, MD  cyclobenzaprine (FLEXERIL) 10 MG tablet Take 1 tablet (10 mg total) by mouth 3 (three) times daily as needed. 10/21/15   Keeyon Privitera, PA-C  etonogestrel (IMPLANON) 68 MG IMPL implant 1 each by Subdermal route once.    Historical Provider, MD  HYDROcodone-acetaminophen (NORCO/VICODIN) 5-325 MG tablet Take one tab po q 4-6 hrs prn pain 11/28/15   Rilyn Upshaw, PA-C  ibuprofen (ADVIL,MOTRIN) 600  MG tablet Take 1 tablet (600 mg total) by mouth 3 (three) times daily. 10/21/15   Amour Cutrone, PA-C   BP 115/78 mmHg  Pulse 77  Temp(Src) 98.2 F (36.8 C) (Oral)  Resp 16  Ht 5' (1.524 m)  Wt 73.029 kg  BMI 31.44 kg/m2  SpO2 100% Physical Exam  Constitutional: She is oriented to person, place, and time. She appears well-developed and well-nourished. No distress.  HENT:  Head: Normocephalic and atraumatic.  Mouth/Throat: Oropharynx is clear and moist.  Eyes: EOM are normal. Pupils are equal, round, and reactive to  light.  Neck: Normal range of motion, full passive range of motion without pain and phonation normal. Neck supple. No spinous process tenderness and no muscular tenderness present. No rigidity. No Kernig's sign noted.  Cardiovascular: Normal rate, regular rhythm, normal heart sounds and intact distal pulses.   No murmur heard. Pulmonary/Chest: Effort normal and breath sounds normal. No respiratory distress.  Musculoskeletal: Normal range of motion.  Neurological: She is alert and oriented to person, place, and time. She has normal strength. No cranial nerve deficit or sensory deficit. She exhibits normal muscle tone. Coordination and gait normal. GCS eye subscore is 4. GCS verbal subscore is 5. GCS motor subscore is 6.  Reflex Scores:      Tricep reflexes are 2+ on the right side and 2+ on the left side.      Bicep reflexes are 2+ on the right side and 2+ on the left side. Skin: Skin is warm and dry.  Psychiatric: She has a normal mood and affect.  Nursing note and vitals reviewed.   ED Course  Procedures (including critical care time) Labs Review Labs Reviewed - No data to display  Imaging Review No results found. I have personally reviewed and evaluated these images and lab results as part of my medical decision-making.   EKG Interpretation None      MDM   Final diagnoses:  Chronic tension-type headache, not intractable    Vitals stable,  Pt is non-toxic appearing.  No focal neuro deficits, no nuchal rigidity.  Headache of gradual onset that is similar to previous.  Patient agrees to close f/u with PMD or to return here if the symptoms worsen.  Symptoms improved after toradol.  Pt agrees to PMD f/u, stable for d/c    Pauline Aus, PA-C 12/01/15 2239  Bethann Berkshire, MD 12/04/15 0900

## 2016-04-04 ENCOUNTER — Encounter (HOSPITAL_COMMUNITY): Payer: Self-pay | Admitting: *Deleted

## 2016-04-04 ENCOUNTER — Emergency Department (HOSPITAL_COMMUNITY)
Admission: EM | Admit: 2016-04-04 | Discharge: 2016-04-04 | Disposition: A | Discharge status: Home / Self Care | Payer: Medicaid Other | Attending: Emergency Medicine | Admitting: Emergency Medicine

## 2016-04-04 DIAGNOSIS — M79672 Pain in left foot: Secondary | ICD-10-CM | POA: Insufficient documentation

## 2016-04-04 DIAGNOSIS — M25549 Pain in joints of unspecified hand: Secondary | ICD-10-CM

## 2016-04-04 DIAGNOSIS — Z87891 Personal history of nicotine dependence: Secondary | ICD-10-CM | POA: Insufficient documentation

## 2016-04-04 DIAGNOSIS — M25541 Pain in joints of right hand: Secondary | ICD-10-CM | POA: Insufficient documentation

## 2016-04-04 DIAGNOSIS — M79671 Pain in right foot: Secondary | ICD-10-CM | POA: Insufficient documentation

## 2016-04-04 DIAGNOSIS — J45909 Unspecified asthma, uncomplicated: Secondary | ICD-10-CM | POA: Insufficient documentation

## 2016-04-04 DIAGNOSIS — M25542 Pain in joints of left hand: Secondary | ICD-10-CM | POA: Insufficient documentation

## 2016-04-04 MED ORDER — PREDNISONE 20 MG PO TABS: 40.0000 mg | ORAL_TABLET | Freq: Every day | ORAL | Status: DC

## 2016-04-04 MED ORDER — PREDNISONE 20 MG PO TABS
40.0000 mg | ORAL_TABLET | Freq: Once | ORAL | Status: AC
  Administered 2016-04-04: 40 mg via ORAL
  Filled 2016-04-04: qty 2

## 2016-04-04 MED ORDER — NAPROXEN 500 MG PO TABS: 500.0000 mg | ORAL_TABLET | Freq: Two times a day (BID) | ORAL | Status: DC

## 2016-04-04 NOTE — Discharge Instructions (Signed)
Joint Pain  Joint pain can be caused by many things. The joint can be bruised, infected, weak from aging, or sore from exercise. The pain will probably go away if you follow your doctor's instructions for home care. If your joint pain continues, more tests may be needed to help find the cause of your condition.  HOME CARE  Watch your condition for any changes. Follow these instructions as told to lessen the pain that you are feeling:  · Take medicines only as told by your doctor.  · Rest the sore joint for as long as told by your doctor. If your doctor tells you to, raise (elevate) the painful joint above the level of your heart while you are sitting or lying down.  · Do not do things that cause pain or make the pain worse.  · If told, put ice on the painful area:    Put ice in a plastic bag.    Place a towel between your skin and the bag.    Leave the ice on for 20 minutes, 2-3 times per day.  · Wear an elastic bandage, splint, or sling as told by your doctor. Loosen the bandage or splint if your fingers or toes lose feeling (become numb) and tingle, or if they turn cold and blue.  · Begin exercising or stretching the joint as told by your doctor. Ask your doctor what types of exercise are safe for you.  · Keep all follow-up visits as told by your doctor. This is important.  GET HELP IF:  · Your pain gets worse and medicine does not help it.  · Your joint pain does not get better in 3 days.  · You have more bruising or swelling.  · You have a fever.  · You lose 10 pounds (4.5 kg) or more without trying.  GET HELP RIGHT AWAY IF:  · You are not able to move the joint.  · Your fingers or toes become numb or they turn cold and blue.     This information is not intended to replace advice given to you by your health care provider. Make sure you discuss any questions you have with your health care provider.     Document Released: 08/27/2009 Document Revised: 09/29/2014 Document Reviewed: 06/20/2014  Elsevier Interactive  Patient Education ©2016 [employer]

## 2016-04-04 NOTE — ED Notes (Addendum)
Pt reports "joint pain" x 1 week. Pt states this happens off and on and only happens for a "few" days. Pt states she has not been seen by her PCP for this. Pt reports taking 800 mg of ibuprofen every 4 hours with minimal relief.

## 2016-04-04 NOTE — ED Notes (Signed)
Several day history of joint pain without relief of OTC meds- joints are not visably swollen hot and pt moves ad lib

## 2016-04-06 NOTE — ED Provider Notes (Signed)
CSN: [id number]     Arrival date & time 04/04/16  1949 History   First MD Initiated Contact with Patient 04/04/16 2013     Chief Complaint  Patient presents with  . Joint Pain     (Consider location/radiation/quality/duration/timing/severity/associated sxs/prior Treatment) HPI   Kiylah Loyer is a 25 y.o. female who presents to the Emergency Department complaining of pain to her fingers and toes for one week.  She reports recurrent pain to same area for month, but pain has been persistent this time for one week.  She has been taking ibuprofen without relief.  Patient;s mother reports hx of "arthritis" in the family.  Patient denies injury, redness, swelling of the joints, rash or recent tick bite.     Past Medical History  Diagnosis Date  . H/O varicella   . Back pain   . Chronic back pain   . Asthma    Past Surgical History  Procedure Laterality Date  . Tonsillectomy    . Dilation and evacuation N/A 03/08/2014    Procedure: DILATATION AND EVACUATION ;  Surgeon: Purcell Nails, MD;  Location: [facility];  Service: Gynecology;  Laterality: N/A;  with chromosomal studies, sent    Family History  Problem Relation Age of Onset  . Hypertension Father   . Diabetes Father   . Fibromyalgia Mother   . Arthritis Mother   . Depression Mother   . Diabetes Paternal Grandmother   . Cancer Paternal Grandmother   . Stroke Paternal Grandmother    Social History  Substance Use Topics  . Smoking status: Former Smoker    Types: Cigarettes    Quit date: 11/21/2011  . Smokeless tobacco: Never Used     Comment: e-cig  . Alcohol Use: No   OB History    Gravida Para Term Preterm AB TAB SAB Ectopic Multiple Living   0 2     Review of Systems  Constitutional: Negative for fever and chills.  Cardiovascular: Negative for chest pain.  Musculoskeletal: Positive for arthralgias (pain to fingers and toes of both hands and feet). Negative for joint swelling and neck pain.  Skin:  Negative for color change, rash and wound.  All other systems reviewed and are negative.     Allergies  Review of patient's allergies indicates no known allergies.  Home Medications   Prior to Admission medications   Medication Sig Start Date End Date Taking? Authorizing Provider  acetaminophen (TYLENOL) 500 MG tablet Take 1,000 mg by mouth every 6 (six) hours as needed for mild pain, moderate pain or headache.     Historical Provider, MD  Biotin 300 MCG TABS Take 300 mcg by mouth daily.    Historical Provider, MD  etonogestrel (IMPLANON) 68 MG IMPL implant 1 each by Subdermal route once.    Historical Provider, MD  naproxen (NAPROSYN) 500 MG tablet Take 1 tablet (500 mg total) by mouth 2 (two) times daily with a meal. 04/04/16   Jamill Wetmore, PA-C  predniSONE (DELTASONE) 20 MG tablet Take 2 tablets (40 mg total) by mouth daily. For 5 days 04/04/16   Angeliz Settlemyre, PA-C   BP 126/84 mmHg  Pulse 93  Temp(Src) 98.2 F (36.8 C) (Oral)  Resp 18  Ht  (1.676 m)  Wt 73.483 kg  BMI 26.16 kg/m2  SpO2 100% Physical Exam  Constitutional: She is oriented to person, place, and time. She appears well-developed and well-nourished. No distress.  HENT:  Mouth/Throat:  Oropharynx is clear and moist.  Neck: Normal range of motion. Neck supple.  Cardiovascular: Normal rate, regular rhythm and intact distal pulses.   Pulmonary/Chest: Effort normal. No respiratory distress.  Musculoskeletal: She exhibits tenderness. She exhibits no edema.  Tenderness with ROM of the distal and proximal joints of bilateral hands and feet.  No erythema, edema, or excessive warmth.  Sensation intact  Neurological: She is alert and oriented to person, place, and time. Coordination normal.  Skin: Skin is warm. No rash noted.  Psychiatric: She has a normal mood and affect.  Nursing note and vitals reviewed.   ED Course  Procedures (including critical care time) Labs Review Labs Reviewed - No data to  display  Imaging Review No results found. I have personally reviewed and evaluated these images and lab results as part of my medical decision-making.   EKG Interpretation None      MDM   Final diagnoses:  Pain in multiple finger joints  Foot pain, bilateral    Pt well appearing.  arthralgia of multiple joints of the fingers and toes.  No hot joints or bony deformities. No fever, rash or other sx's.  Appears stable for d/c.  Pt agrees to arrange PMD f/u.  Will prescribe prednisone and naprosyn.      Pauline Ausammy Avamarie Crossley, PA-C 04/06/16 1320  Donnetta HutchingBrian Cook, MD 04/06/16 [phone]

## 2016-04-30 ENCOUNTER — Ambulatory Visit: Payer: Medicaid Other | Admitting: Family Medicine

## 2016-08-18 ENCOUNTER — Ambulatory Visit (INDEPENDENT_AMBULATORY_CARE_PROVIDER_SITE_OTHER): Payer: Self-pay | Admitting: Family Medicine

## 2016-08-18 ENCOUNTER — Encounter: Payer: Self-pay | Admitting: Family Medicine

## 2016-08-18 VITALS — BP 133/78 | HR 75 | Temp 98.1°F | Ht 66.0 in | Wt 165.0 lb

## 2016-08-18 DIAGNOSIS — N921 Excessive and frequent menstruation with irregular cycle: Secondary | ICD-10-CM

## 2016-08-18 LAB — POCT HEMOGLOBIN: Hemoglobin: 13.4 g/dL (ref 12.2–16.2)

## 2016-08-18 LAB — POCT URINE PREGNANCY: PREG TEST UR: NEGATIVE

## 2016-08-18 MED ORDER — CITALOPRAM HYDROBROMIDE 10 MG PO TABS: 10.0000 mg | ORAL_TABLET | Freq: Every day | ORAL | 2 refills | Status: DC

## 2016-08-18 MED ORDER — PRENATAL 19 29-1 MG PO TABS: 1.0000 | ORAL_TABLET | Freq: Every day | ORAL | 11 refills | Status: DC

## 2016-08-18 MED ORDER — ESTROGENS CONJUGATED 1.25 MG PO TABS: 2.5000 mg | ORAL_TABLET | Freq: Two times a day (BID) | ORAL | 0 refills | Status: DC

## 2016-08-18 NOTE — Progress Notes (Signed)
   HPI  CC: Menorrhagia Started ~424moths ago after weaning from nursing.  Constant.  Seems to wax and wane throughout the months. No cyclic nature.  Has had implant for the past 16months. No issues while breast-feeding. Requiring 3-4 tampons daily. Denies symptoms of headache, nausea, vomiting, dizziness, blurred vision, lightheadedness, shortness of breath, chest pain, dysuria, hematochezia, melena, dyspareunia.  Review of Systems    See HPI for ROS. All other systems reviewed and are negative.  CC, SH/smoking status, and VS noted  Objective: BP 133/78   Pulse 75   Temp 98.1 F (36.7 C) (Oral)   Ht 5\' 6"  (1.676 m)   Wt 165 lb (74.8 kg)   BMI 26.63 kg/m  Gen: NAD, alert, cooperative, and pleasant. Well appearing.  CV: RRR, no murmur Resp: CTAB, no wheezes, non-labored Abd: SNTND, BS present, no guarding or organomegaly Ext: No edema, warm Neuro: Alert and oriented, Speech clear, No gross deficits  Assessment and plan:  Menorrhagia Patient is here with complaints of menorrhagia. Etiology likely secondary to contraceptive implant. New onset bleeding seems to be directly associated with the discontinuing of breast-feeding. No red flag symptoms at this time. Hemoglobin is stable. At this point I am unsure whether or not to treat this as a contraceptive failure after 16 months of implant being in place, or patient's hormonal balance currently off balance due to the natural hormonal fluctuations with discontinuing breast-feeding. - Premarin 2.5 twice a day 20 days - My hope is that this will provide 3 weeks of relief from her bleeding. Unfortunately I do not believe this will completely resolve her symptoms long-term. This may take an additional 4-6 months to fully resolve, or may not resolve at all.  Next: I have already discussed with patient the possibility of changing contraceptive devices. Patient is considering this change at this time.   Orders Placed This Encounter    Procedures  . POCT urine pregnancy  . POCT hemoglobin    Meds ordered this encounter  Medications  . DISCONTD: estrogens, conjugated, (PREMARIN) 1.25 MG tablet    Sig: Take 2 tablets (2.5 mg total) by mouth 2 (two) times daily.    Dispense:  40 tablet    Refill:  0  . DISCONTD: citalopram (CELEXA) 10 MG tablet    Sig: Take 1 tablet (10 mg total) by mouth daily.    Dispense:  30 tablet    Refill:  2  . DISCONTD: Prenatal Vit-DSS-Fe Fum-FA (PRENATAL 19) 29-1 MG TABS    Sig: Take 1 tablet by mouth daily.    Dispense:  30 tablet    Refill:  11  . citalopram (CELEXA) 10 MG tablet    Sig: Take 1 tablet (10 mg total) by mouth daily.    Dispense:  60 tablet    Refill:  2  . Prenatal Vit-DSS-Fe Fum-FA (PRENATAL 19) 29-1 MG TABS    Sig: Take 1 tablet by mouth daily.    Dispense:  30 tablet    Refill:  11  . estrogens, conjugated, (PREMARIN) 1.25 MG tablet    Sig: Take 2 tablets (2.5 mg total) by mouth 2 (two) times daily.    Dispense:  40 tablet    Refill:  0     Kathee DeltonIan D Star Resler, MD,MS,  PGY3 08/19/2016 5:44 PM

## 2016-08-18 NOTE — Patient Instructions (Signed)
It was a pleasure seeing you today in our clinic. Today we discussed your vaginal bleeding. Here is the treatment plan we have discussed and agreed upon together:   - I started you on Premarin. Take 1 tablet twice a day for the next 20 days. This should help your vaginal bleeding. Unfortunately, it is likely that your vaginal bleeding may start again after you've discontinued this medication. My hope is that over the next few months this vaginal bleeding should improve as her body gets used to the changes in hormone levels since discontinuing breast-feeding. - Do your best to stay active wall on this medication. There is a increased risk for blood clots while on these medications. Doing your best to stay active and not to stay sedentary for prolonged period of time will help prevent this from happening. If you experience any sudden onset chest pain or shortness of breath or swelling in one of your extremities then do not hesitate to make a appointment in our office.

## 2016-08-19 ENCOUNTER — Telehealth: Payer: Self-pay | Admitting: *Deleted

## 2016-08-19 DIAGNOSIS — N920 Excessive and frequent menstruation with regular cycle: Secondary | ICD-10-CM

## 2016-08-19 HISTORY — DX: Excessive and frequent menstruation with regular cycle: N92.0

## 2016-08-19 NOTE — Assessment & Plan Note (Signed)
Patient is here with complaints of menorrhagia. Etiology likely secondary to contraceptive implant. New onset bleeding seems to be directly associated with the discontinuing of breast-feeding. No red flag symptoms at this time. Hemoglobin is stable. At this point I am unsure whether or not to treat this as a contraceptive failure after 16 months of implant being in place, or patient's hormonal balance currently off balance due to the natural hormonal fluctuations with discontinuing breast-feeding. - Premarin 2.5 twice a day 20 days - My hope is that this will provide 3 weeks of relief from her bleeding. Unfortunately I do not believe this will completely resolve her symptoms long-term. This may take an additional 4-6 months to fully resolve, or may not resolve at all.  Next: I have already discussed with patient the possibility of changing contraceptive devices. Patient is considering this change at this time.

## 2016-08-19 NOTE — Telephone Encounter (Signed)
[facility]. Vitamins are not covered by medicaid.  Clovis PuMartin, Annalese Stiner L, RN

## 2016-08-20 NOTE — Telephone Encounter (Signed)
I have not received any PA paperwork for this.

## 2016-08-20 NOTE — Telephone Encounter (Signed)
Medication is not covered by insurance due to being over the counter.Left voice message for patient that medication is over the counter.    Clovis PuMartin, Tamika L, RN

## 2016-09-03 ENCOUNTER — Telehealth: Payer: Self-pay | Admitting: Family Medicine

## 2016-09-03 NOTE — Telephone Encounter (Signed)
Mom stated the estrogen medication that was prescribed for pt is too expensive, $240. Mom would like something more affordable called. [facility]. Please advise. Thanks! ep

## 2016-09-11 ENCOUNTER — Other Ambulatory Visit: Payer: Self-pay | Admitting: Family Medicine

## 2016-09-11 MED ORDER — NORGESTIMATE-ETH ESTRADIOL 0.25-35 MG-MCG PO TABS: 1.0000 | ORAL_TABLET | Freq: Every day | ORAL | 0 refills | Status: DC

## 2016-09-11 NOTE — Telephone Encounter (Signed)
One pack of oral contraceptives has been ordered to replace "Premarin". Have patient take this to completion. Bleeding may occur at end of pack, but my hope is to have her then level off and stop. Call with questions.  Please contact patient with this info as I am post-call.

## 2016-09-11 NOTE — Telephone Encounter (Signed)
Left voice message for patient to return nurse call.  Martin, Tamika L, RN  

## 2016-09-18 ENCOUNTER — Telehealth: Payer: Self-pay | Admitting: Family Medicine

## 2016-09-18 ENCOUNTER — Other Ambulatory Visit: Payer: Self-pay | Admitting: Family Medicine

## 2016-09-18 NOTE — Telephone Encounter (Signed)
Mom states pt already has the Nexplanon in her arm and does not understand why a Rx for Ortho Tri Cyclen was called in when pt just need a straight estrogen pill. Please advise. Thanks! ep

## 2016-09-18 NOTE — Telephone Encounter (Signed)
Patient should have 2 more refills

## 2016-09-18 NOTE — Telephone Encounter (Signed)
Will forward to PCP.  Jameil Whitmoyer L, RN  

## 2016-09-18 NOTE — Telephone Encounter (Signed)
Patient's mother asked for a cheaper option. That is the cheaper option.

## 2016-09-18 NOTE — Telephone Encounter (Signed)
The previous Rx called in WAS the "straight estrogen" option.

## 2016-09-18 NOTE — Telephone Encounter (Signed)
Pt needs a refill on Celexa, pt is out. Pt uses [facility]. ep

## 2016-09-19 MED ORDER — CITALOPRAM HYDROBROMIDE 10 MG PO TABS: 20.0000 mg | ORAL_TABLET | Freq: Every day | ORAL | 2 refills | Status: DC

## 2016-09-19 NOTE — Telephone Encounter (Signed)
Spoke with Katie at pharmacy and patient needs a new script to reflect her taking 2 10mg  tablets daily in order for insurance to cover her filling it.  Ok per PCP and medication called into pharmacy. Jazmin Hartsell,[name]

## 2016-09-19 NOTE — Telephone Encounter (Signed)
Mother called back and states that patient was instructed at her last visit to take 1 pill daily for a week and then start taking 2 pills a day.  Since she has been taking 2 pills a day she ran out 3 days ago.  She will need the pharmacy to be contacted in order to pick up medication early, since according to directions on script patient was dispensed enough medication for 1 a day for 2 months.  Will forward to MD to advise. Ellianne Gowen,[name]

## 2016-09-19 NOTE — Telephone Encounter (Signed)
Patient mother informed, expressed understanding. 

## 2016-09-19 NOTE — Telephone Encounter (Signed)
Again. Please inform patient has 2 full 30 day supply refills available from the original script.  Please check patient's medication list to make sure this is correct but (from what I see) 60 tablets with 3 refills was provided 1 month ago.

## 2016-09-19 NOTE — Addendum Note (Signed)
Addended by: Henri MedalHARTSELL, Charmelle Soh M on: 09/19/2016 04:20 PM   Modules accepted: Orders

## 2016-09-25 ENCOUNTER — Telehealth: Payer: Self-pay | Admitting: Family Medicine

## 2016-09-25 ENCOUNTER — Other Ambulatory Visit: Payer: Self-pay | Admitting: Family Medicine

## 2016-09-25 NOTE — Telephone Encounter (Signed)
Mother is calling for a new prescription Celexa for her daughter. She was told to increase these and she has but the pharmacy will not fill the prescription since it looks like it is to early. Can we call and get the new prescription over there so that she can get her medication. jw

## 2016-09-25 NOTE — Telephone Encounter (Signed)
This was corrected and sent to her pharmacy on 12/29. Please make sure this is correct.

## 2016-09-30 NOTE — Telephone Encounter (Signed)
Left message for patient to return call.

## 2016-12-17 ENCOUNTER — Encounter: Payer: Self-pay | Admitting: Family Medicine

## 2016-12-17 ENCOUNTER — Ambulatory Visit (INDEPENDENT_AMBULATORY_CARE_PROVIDER_SITE_OTHER): Payer: Self-pay | Admitting: Family Medicine

## 2016-12-17 ENCOUNTER — Other Ambulatory Visit (HOSPITAL_COMMUNITY)
Admission: RE | Admit: 2016-12-17 | Discharge: 2016-12-17 | Disposition: A | Discharge status: Home / Self Care | Payer: Medicaid Other | Source: Ambulatory Visit | Attending: Family Medicine | Admitting: Family Medicine

## 2016-12-17 VITALS — BP 112/76 | HR 101 | Temp 98.8°F | Ht 66.0 in | Wt 163.0 lb

## 2016-12-17 DIAGNOSIS — Z202 Contact with and (suspected) exposure to infections with a predominantly sexual mode of transmission: Secondary | ICD-10-CM | POA: Insufficient documentation

## 2016-12-17 DIAGNOSIS — G8929 Other chronic pain: Secondary | ICD-10-CM

## 2016-12-17 DIAGNOSIS — M546 Pain in thoracic spine: Secondary | ICD-10-CM

## 2016-12-17 DIAGNOSIS — N921 Excessive and frequent menstruation with irregular cycle: Secondary | ICD-10-CM

## 2016-12-17 MED ORDER — CYCLOBENZAPRINE HCL 10 MG PO TABS: 10.0000 mg | ORAL_TABLET | Freq: Three times a day (TID) | ORAL | 1 refills | Status: DC | PRN

## 2016-12-17 MED ORDER — CITALOPRAM HYDROBROMIDE 20 MG PO TABS: 20.0000 mg | ORAL_TABLET | Freq: Every day | ORAL | 3 refills | Status: DC

## 2016-12-17 MED ORDER — TRAMADOL HCL 50 MG PO TABS: 50.0000 mg | ORAL_TABLET | Freq: Three times a day (TID) | ORAL | 0 refills | Status: DC | PRN

## 2016-12-17 MED ORDER — NORGESTIMATE-ETH ESTRADIOL 0.25-35 MG-MCG PO TABS: 1.0000 | ORAL_TABLET | Freq: Every day | ORAL | 2 refills | Status: DC

## 2016-12-17 NOTE — Patient Instructions (Addendum)
It was a pleasure seeing you today in our clinic. Today we discussed your back pain and vaginal bleeding. Here is the treatment plan we have discussed and agreed upon together:   - I have written you another prescription for oral contraceptives. I would like for you to take these over the next 3 months. My hope is once you completed this course you will experience a more regular menstrual pattern while on the Nexplanon. - I've refilled your Celexa for 1 year. - I have placed an order for Flexeril. Take this up to 3 times a day as needed for back pain and spasming.  - Daily back exercises and stretching exercises will likely help this back pain you are experiencing. Working on the upper back, shoulders, and your posture will be the most helpful.

## 2016-12-17 NOTE — Assessment & Plan Note (Signed)
Patient endorsing lumbothoracic back pain. Etiology likely muscle strain versus soreness. Cause of this pain is likely secondary to patient having some deconditioning paired with 2 young children and a large breast size. - Discussed stretches and upper/middle back strengthening exercises. - NSAIDs as needed - Flexeril provided for back spasms. - Ultram provided as a strictly as needed medication for severe symptoms. - Discussed breast reduction surgery. Patient states that she has already looked into this but due to her lack of insurance it is not something she could currently afford.

## 2016-12-17 NOTE — Progress Notes (Signed)
   HPI  CC: Vaginal bleeding and back pain Patient is here with complaints of persistent menorrhagia with irregular menses. She had been seen in the past and was prescribed a month of OCPs. She states that during that month of use she had resolution in her vaginal bleeding. After the completion of that month her bleeding restarted and is now improved from what it was before but she still has up to 3 menses in a month's period. Patient denies any recent/no headache, fatigue, weakness, blurred vision, shortness of breath, or presyncopal symptoms. Patient was having regular menses in the past but has since had issues after receiving the Nexplanon implant a little over a year ago.  Back pain: Patient continues to have some lumbar back pain/soreness. It is worse with bending and twisting. She denies any red flag symptoms of numbness, weakness, paresthesias, saddle anesthesia, or incontinence. She denies any specific injury. Pain is worse when picking up her children. She states that she stretches daily.  Review of Systems See HPI for ROS.   CC, SH/smoking status, and VS noted  Objective: BP 112/76   Pulse (!) 101   Temp 98.8 F (37.1 C) (Oral)   Ht 5\' 6"  (1.676 m)   Wt 163 lb (73.9 kg)   BMI 26.31 kg/m  Gen: NAD, alert, cooperative, and pleasant. CV: RRR, no murmur Resp: CTAB, no wheezes, non-labored Abd: S, slight discomfort in lower quadrants, ND, BS present, no guarding or organomegaly Ext: No edema, warm; some paraspinal lumbar pain/tenderness. ROM intact throughout. Strength intact throughout. Neuro: Alert and oriented, Speech clear, No gross deficits   Assessment and plan:  Menorrhagia Improved but persistent: Patient had been seen in the past for the same issue. Was given one month of OCPs. Symptoms have improved since that time but are still occurring. - OCPs 3 months provided. - Patient to follow-up in 2-3 months  Back pain Patient endorsing lumbothoracic back pain.  Etiology likely muscle strain versus soreness. Cause of this pain is likely secondary to patient having some deconditioning paired with 2 young children and a large breast size. - Discussed stretches and upper/middle back strengthening exercises. - NSAIDs as needed - Flexeril provided for back spasms. - Ultram provided as a strictly as needed medication for severe symptoms. - Discussed breast reduction surgery. Patient states that she has already looked into this but due to her lack of insurance it is not something she could currently afford.   Meds ordered this encounter  Medications  . citalopram (CELEXA) 20 MG tablet    Sig: Take 1 tablet (20 mg total) by mouth daily.    Dispense:  90 tablet    Refill:  3  . norgestimate-ethinyl estradiol (ORTHO-CYCLEN,SPRINTEC,PREVIFEM) 0.25-35 MG-MCG tablet    Sig: Take 1 tablet by mouth daily.    Dispense:  1 Package    Refill:  2  . cyclobenzaprine (FLEXERIL) 10 MG tablet    Sig: Take 1 tablet (10 mg total) by mouth 3 (three) times daily as needed for muscle spasms.    Dispense:  30 tablet    Refill:  1  . traMADol (ULTRAM) 50 MG tablet    Sig: Take 1 tablet (50 mg total) by mouth every 8 (eight) hours as needed.    Dispense:  30 tablet    Refill:  0     Kathee DeltonIan D McKeag, MD,MS,  PGY3 12/17/2016 6:07 PM

## 2016-12-17 NOTE — Assessment & Plan Note (Signed)
Improved but persistent: Patient had been seen in the past for the same issue. Was given one month of OCPs. Symptoms have improved since that time but are still occurring. - OCPs 3 months provided. - Patient to follow-up in 2-3 months

## 2016-12-18 LAB — URINE CYTOLOGY ANCILLARY ONLY
CHLAMYDIA, DNA PROBE: NEGATIVE
NEISSERIA GONORRHEA: NEGATIVE

## 2017-01-08 ENCOUNTER — Other Ambulatory Visit: Payer: Self-pay | Admitting: Family Medicine

## 2017-01-08 ENCOUNTER — Telehealth: Payer: Self-pay | Admitting: Family Medicine

## 2017-01-08 MED ORDER — CITALOPRAM HYDROBROMIDE 20 MG PO TABS: 40.0000 mg | ORAL_TABLET | Freq: Every day | ORAL | 3 refills | Status: DC

## 2017-01-08 NOTE — Telephone Encounter (Signed)
Mother states Celexa RX that was recently was sent in was written incorrectly. Mother states that Dr. Wende Mott advised patient that she could take 2 tabs of Celexa and this is what patient has been taking. Mother would like RX for be changed to 2 x a day. Please advise.

## 2017-01-08 NOTE — Telephone Encounter (Signed)
Contacted patient. Informed her that at our last visit I had increased her dose to Celexa 20 mg once daily. I had also informed her at that time that her new prescription would involve 20 mg tabs/capsules instead of her previous 10 mg tabs/capsules. Patient had stated her understanding of this, but it appears as though patient had forgotten.  During our conversation patient informed me that she had been taking 2 of the 20 mg tabs/capsules once a day since approximately one week after our last visit. She states that since she has started this dose she feels "great". I informed her that with this mistake she is taking 40 mg of Celexa a day and that although higher than our intended dose it is still a safe dose for her to be taking. Patient expressed her desire to stay at this dose.  I informed her that I would be willing to keep her at 40 mg of Celexa. I also informed her that with the most recent mixup regarding her medication dosings that I felt more comfortable prescribing her 20 mg tabs/capsules of Celexa which she would just take 2 of every day. Patient stated her understanding and thanked me for this change. Patient had no additional questions at this time.

## 2017-01-20 ENCOUNTER — Other Ambulatory Visit: Payer: Self-pay | Admitting: *Deleted

## 2017-01-20 MED ORDER — NORGESTIMATE-ETH ESTRADIOL 0.25-35 MG-MCG PO TABS: 1.0000 | ORAL_TABLET | Freq: Every day | ORAL | 2 refills | Status: DC

## 2017-01-20 NOTE — Telephone Encounter (Signed)
Patient requesting refill on birth control pills due to vaginal bleeding.

## 2017-03-23 ENCOUNTER — Encounter (HOSPITAL_COMMUNITY): Payer: Self-pay | Admitting: Emergency Medicine

## 2017-03-23 ENCOUNTER — Emergency Department (HOSPITAL_COMMUNITY): Payer: Self-pay

## 2017-03-23 ENCOUNTER — Emergency Department (HOSPITAL_COMMUNITY)
Admission: EM | Admit: 2017-03-23 | Discharge: 2017-03-23 | Disposition: A | Discharge status: Home / Self Care | Payer: Self-pay | Attending: Emergency Medicine | Admitting: Emergency Medicine

## 2017-03-23 DIAGNOSIS — W1789XA Other fall from one level to another, initial encounter: Secondary | ICD-10-CM | POA: Insufficient documentation

## 2017-03-23 DIAGNOSIS — Z79899 Other long term (current) drug therapy: Secondary | ICD-10-CM | POA: Insufficient documentation

## 2017-03-23 DIAGNOSIS — S93601A Unspecified sprain of right foot, initial encounter: Secondary | ICD-10-CM | POA: Insufficient documentation

## 2017-03-23 DIAGNOSIS — Y939 Activity, unspecified: Secondary | ICD-10-CM | POA: Insufficient documentation

## 2017-03-23 DIAGNOSIS — J4520 Mild intermittent asthma, uncomplicated: Secondary | ICD-10-CM | POA: Insufficient documentation

## 2017-03-23 DIAGNOSIS — Z87891 Personal history of nicotine dependence: Secondary | ICD-10-CM | POA: Insufficient documentation

## 2017-03-23 DIAGNOSIS — Y929 Unspecified place or not applicable: Secondary | ICD-10-CM | POA: Insufficient documentation

## 2017-03-23 DIAGNOSIS — Y999 Unspecified external cause status: Secondary | ICD-10-CM | POA: Insufficient documentation

## 2017-03-23 LAB — POC URINE PREG, ED: PREG TEST UR: NEGATIVE

## 2017-03-23 MED ORDER — TRAMADOL HCL 50 MG PO TABS: 50.0000 mg | ORAL_TABLET | Freq: Four times a day (QID) | ORAL | 0 refills | Status: DC | PRN

## 2017-03-23 MED ORDER — TRAMADOL HCL 50 MG PO TABS
50.0000 mg | ORAL_TABLET | Freq: Once | ORAL | Status: AC
  Administered 2017-03-23: 50 mg via ORAL

## 2017-03-23 MED ORDER — TRAMADOL HCL 50 MG PO TABS
ORAL_TABLET | ORAL | Status: AC
  Filled 2017-03-23: qty 1

## 2017-03-23 NOTE — ED Triage Notes (Signed)
Patient complaining of right foot pain after falling yesterday and twisting her right leg. States pain radiates into right knee.

## 2017-03-23 NOTE — ED Provider Notes (Signed)
[facility] DEPT Provider Note   CSN: [id number] Arrival date & time: 03/23/17  1117     History   Chief Complaint Chief Complaint  Patient presents with  . Foot Pain    HPI Marissa Contreras is a 26 y.o. female presenting with right foot and ankle pain which occurred suddenly when the patient everted her foot when she fell out of her husbands truck (backwards) as she was trying to get into the cab.  Pain is aching, constant and worse with palpation, movement and weight bearing.  The patient was able to weight bear immediately after the event.  There is  radiation of pain into the outer knee area. She denies numbness distal to the injury site.  The patients treatment prior to arrival included rest and elevation. .  The history is provided by the patient.    Past Medical History:  Diagnosis Date  . Asthma   . Back pain   . Chronic back pain   . H/O varicella     Patient Active Problem List   Diagnosis Date Noted  . Menorrhagia 08/19/2016  . Mild intermittent asthma 12/20/2014  . H/O varicella   . Back pain 02/11/2012    Past Surgical History:  Procedure Laterality Date  . DILATION AND EVACUATION N/A 03/08/2014   Procedure: DILATATION AND EVACUATION ;  Surgeon: Purcell Nails, MD;  Location: [facility];  Service: Gynecology;  Laterality: N/A;  with chromosomal studies, sent   . TONSILLECTOMY      OB History    Gravida Para Term Preterm AB Living   5 2 2   3 2    SAB TAB Ectopic Multiple Live Births   3     0 2       Home Medications    Prior to Admission medications   Medication Sig Start Date End Date Taking? Authorizing Provider  acetaminophen (TYLENOL) 500 MG tablet Take 1,000 mg by mouth every 6 (six) hours as needed for mild pain, moderate pain or headache.     [provider]  Biotin 300 MCG TABS Take 300 mcg by mouth daily.    [provider]  citalopram (CELEXA) 20 MG tablet Take 2 tablets (40 mg total) by mouth daily. 01/08/17   McKeag, Janine Ores, MD  cyclobenzaprine (FLEXERIL) 10 MG tablet Take 1 tablet (10 mg total) by mouth 3 (three) times daily as needed for muscle spasms. 12/17/16   McKeag, Janine Ores, MD  etonogestrel (IMPLANON) 68 MG IMPL implant 1 each by Subdermal route once.    [provider]  naproxen (NAPROSYN) 500 MG tablet Take 1 tablet (500 mg total) by mouth 2 (two) times daily with a meal. 04/04/16   Triplett, Tammy, PA-C  norgestimate-ethinyl estradiol (ORTHO-CYCLEN,SPRINTEC,PREVIFEM) 0.25-35 MG-MCG tablet Take 1 tablet by mouth daily. 01/20/17   McKeag, Janine Ores, MD  Prenatal Vit-DSS-Fe Fum-FA (PRENATAL 19) 29-1 MG TABS Take 1 tablet by mouth daily. 08/18/16   McKeag, Janine Ores, MD  traMADol (ULTRAM) 50 MG tablet Take 1 tablet (50 mg total) by mouth every 6 (six) hours as needed. 03/23/17   Burgess Amor, PA-C    Family History Family History  Problem Relation Age of Onset  . Hypertension Father   . Diabetes Father   . Fibromyalgia Mother   . Arthritis Mother   . Depression Mother   . Diabetes Paternal Grandmother   . Cancer Paternal Grandmother   . Stroke Paternal Grandmother     Social History Social History  Substance Use Topics  . Smoking status: Former Smoker    Types: Cigarettes  . Smokeless tobacco: Never Used     Comment: e-cig  . Alcohol use No     Allergies   Patient has no known allergies.   Review of Systems Review of Systems  Musculoskeletal: Positive for arthralgias. Negative for joint swelling.  Skin: Negative for wound.  Neurological: Negative for weakness and numbness.     Physical Exam Updated Vital Signs BP 127/76 (BP Location: Right Arm)   Pulse 78   Temp 98.5 F (36.9 C) (Oral)   Resp 16   Ht 5\' 6"  (1.676 m)   Wt 70.8 kg (156 lb)   SpO2 100%   BMI 25.18 kg/m   Physical Exam  Constitutional: She appears well-developed and well-nourished.  HENT:  Head: Normocephalic.  Cardiovascular: Normal rate and intact distal pulses.  Exam reveals no decreased pulses.   Pulses:       Dorsalis pedis pulses are 2+ on the right side, and 2+ on the left side.       Posterior tibial pulses are 2+ on the right side, and 2+ on the left side.  Musculoskeletal: She exhibits edema and tenderness.       Right ankle: She exhibits decreased range of motion. She exhibits no swelling, no ecchymosis, no deformity, no laceration and normal pulse. Tenderness. Proximal fibula tenderness found. No lateral malleolus, no medial malleolus and no head of 5th metatarsal tenderness found. Achilles tendon normal.  ttp across anterior proximal dorsal foot/ankle mortise.  No edema. Pt has FROM of digits, dorsalis pedal pulse full.  No ankle instability. Proximal fibula ttp.  Neurological: She is alert. No sensory deficit.  Skin: Skin is warm, dry and intact.  Nursing note and vitals reviewed.    ED Treatments / Results  Labs (all labs ordered are listed, but only abnormal results are displayed) Labs Reviewed  POC URINE PREG, ED    EKG  EKG Interpretation None       Radiology Dg Tibia/fibula Right  Result Date: 03/23/2017 CLINICAL DATA:  Pain following fall with twisting injury EXAM: RIGHT TIBIA AND FIBULA - 2 VIEW COMPARISON:  None. FINDINGS: Frontal and lateral views were obtained. No fracture or dislocation. No abnormal periosteal reaction. Joint spaces appear normal. IMPRESSION: No fracture or dislocation.  No appreciable arthropathy. Electronically Signed   By: Bretta BangWilliam  Woodruff III M.D.   On: 03/23/2017 12:28   Dg Foot Complete Right  Result Date: 03/23/2017 CLINICAL DATA:  Pain following fall with twisting injury EXAM: RIGHT FOOT COMPLETE - 3+ VIEW COMPARISON:  None. FINDINGS: Frontal, oblique, and lateral views were obtained. There is no fracture or dislocation. Joint spaces appear normal. No erosive change. IMPRESSION: No fracture or dislocation.  No appreciable arthropathy. Electronically Signed   By: Bretta BangWilliam  Woodruff III M.D.   On: 03/23/2017 12:34    Procedures Procedures  (including critical care time)  Medications Ordered in ED Medications - No data to display   Initial Impression / Assessment and Plan / ED Course  I have reviewed the triage vital signs and the nursing notes.  Pertinent labs & imaging results that were available during my care of the patient were reviewed by me and considered in my medical decision making (see chart for details).     Images reviewed, foot/ankle strain with no edema or ecchymosis at this time.  RICE, aso, crutches. F/u with pcp in one week if not improving.   Final Clinical Impressions(s) /  ED Diagnoses   Final diagnoses:  Sprain of right foot, initial encounter    New Prescriptions Discharge Medication List as of 03/23/2017 12:54 PM       Victoriano Lain 03/23/17 2148    Eber Hong, MD 03/26/17 [phone]

## 2017-03-23 NOTE — Discharge Instructions (Signed)
Wear the ASO and use crutches to avoid weight bearing.  Use ice and elevation as much as possible for the next several days to help reduce the swelling.  Take the medications prescribed.  You may take the tramadol prescribed for pain relief.  This will make you drowsy - do not drive within 4 hours of taking this medication.  Use your home ibuprofen 800 mg every 8 hours for inflammation and pain.  Call your doctor for a recheck in 1 week if not improving.  You may benefit from physical therapy of your foot if it is not getting better over the next week. Your xrays are negative for broken bones or dislocation.

## 2017-04-03 ENCOUNTER — Encounter: Payer: Self-pay | Admitting: Family Medicine

## 2017-04-03 ENCOUNTER — Ambulatory Visit (INDEPENDENT_AMBULATORY_CARE_PROVIDER_SITE_OTHER): Payer: Self-pay | Admitting: Family Medicine

## 2017-04-03 VITALS — BP 116/72 | HR 87 | Temp 98.6°F | Ht 66.0 in | Wt 165.0 lb

## 2017-04-03 DIAGNOSIS — M792 Neuralgia and neuritis, unspecified: Secondary | ICD-10-CM

## 2017-04-03 DIAGNOSIS — N921 Excessive and frequent menstruation with irregular cycle: Secondary | ICD-10-CM

## 2017-04-03 MED ORDER — GABAPENTIN 100 MG PO CAPS: 100.0000 mg | ORAL_CAPSULE | Freq: Three times a day (TID) | ORAL | 3 refills | Status: DC

## 2017-04-03 MED ORDER — CYCLOBENZAPRINE HCL 10 MG PO TABS: 10.0000 mg | ORAL_TABLET | Freq: Three times a day (TID) | ORAL | 1 refills | Status: DC | PRN

## 2017-04-03 MED ORDER — TRAMADOL HCL 50 MG PO TABS: 50.0000 mg | ORAL_TABLET | Freq: Four times a day (QID) | ORAL | 0 refills | Status: DC | PRN

## 2017-04-03 NOTE — Progress Notes (Signed)
Subjective: Chief Complaint  Patient presents with  . Birth Control Problems     HPI: Marissa Contreras is a 26 y.o. presenting to clinic today to discuss the following:  # Irregular and increased menstrual bleeding # Nerve pain following acute injury # Chronic low back pain  Marissa Contreras is a 26 y/o female here for irregular bleeding that has gotten progressively worse in the last 3 months. She now bleeds 3 out of every 4 months. She states the she has associated cramping that is painful ranging from 1 to 10/10 lasting a few hours that occur everyday. She states nothing seems to bring on the cramping and tight clothing helps with no aggravating factors. She endorses associated nausea, fatigue, and dizziness. She denies sob, difficulty breathing, chest pain, vomiting, or diarrhea.  Health Maintenance: none     ROS noted in HPI.   Past Medical, Surgical, Social, and Family History Reviewed & Updated per EMR.   Pertinent Historical Findings include:   History  Smoking Status  . Former Smoker  . Types: Cigarettes  Smokeless Tobacco  . Never Used    Comment: e-cig      Objective: BP 116/72   Pulse 87   Temp 98.6 F (37 C) (Oral)   Ht 5\' 6"  (1.676 m)   Wt 165 lb (74.8 kg)   SpO2 99%   BMI 26.63 kg/m  Vitals and nursing notes reviewed  Physical Exam  Gen: A&O x 3, NAD Neck: No thyroidmegaly, trachea midline Resp: CTAB, no wheezing CV: RRR, no murmurs, +2 radial pulses bilaterally Abd: non-distended, mildly tender diffusely, +bs in all four quadrants MSK: moves all four extremities Skin: warm, dry, intact, no rashes  No results found for this or any previous visit (from the past 72 hour(s)).  Assessment/Plan:  1) Menorrhagia - we have scheduled to remove the implanon and take only OCPs to see if this improves the bleeding. If bleeding does not improve discussed further work up and possible OB/GYN appointment. 2) Nerve pain - temporarily try gabapentin for short  term nerve pain due to recent injury of her right leg. 3) Chronic low back pain - refilled flexeril and tramadol for patient as she states this has helped in the past. Discussed with patient tramadol is not a long term option.   Please see problem based Assessment and Plan PATIENT EDUCATION PROVIDED: See AVS    Diagnosis and plan along with any newly prescribed medication(s) were discussed in detail with this patient today. The patient verbalized understanding and agreed with the plan. Patient advised if symptoms worsen return to clinic or ER.   Health Maintainance:     Meds ordered this encounter  Medications  . ibuprofen (ADVIL,MOTRIN) 200 MG tablet    Sig: Take 200 mg by mouth every 6 (six) hours as needed for mild pain, moderate pain or cramping (Take two pills PRN Q8).  . gabapentin (NEURONTIN) 100 MG capsule    Sig: Take 1 capsule (100 mg total) by mouth 3 (three) times daily.    Dispense:  90 capsule    Refill:  3  . cyclobenzaprine (FLEXERIL) 10 MG tablet    Sig: Take 1 tablet (10 mg total) by mouth 3 (three) times daily as needed for muscle spasms.    Dispense:  30 tablet    Refill:  1  . traMADol (ULTRAM) 50 MG tablet    Sig: Take 1 tablet (50 mg total) by mouth every 6 (six) hours as needed.  Dispense:  20 tablet    Refill:  0     Jules Schickim Thelton Graca, DO 04/03/2017, 3:15 PM PGY-1, [facility]

## 2017-04-03 NOTE — Patient Instructions (Addendum)
It was a pleasure meeting you today! Thank you for letting me take care of you!  Please schedule a follow up visit within a month for the removal of your Implanon and keep using your OCPs.  Please take all medications only as prescribed.  Please feel free to call with any questions or concerns.  Jules Schickim Theoplis Garciagarcia

## 2017-04-08 ENCOUNTER — Ambulatory Visit: Payer: Medicaid Other | Admitting: Family Medicine

## 2017-04-09 ENCOUNTER — Ambulatory Visit: Payer: Medicaid Other | Admitting: Internal Medicine

## 2017-04-13 ENCOUNTER — Ambulatory Visit: Payer: Medicaid Other | Admitting: Internal Medicine

## 2017-04-13 NOTE — Progress Notes (Deleted)
[facility] Progress Note  Subjective:  Marissa Contreras is a 26 y.o.  No chief complaint on file.   Past Medical History:  Diagnosis Date  . Asthma   . Back pain   . Chronic back pain   . H/O varicella     Social History   Social History  . Marital status: Married    Spouse name: N/A  . Number of children: N/A  . Years of education: N/A   Occupational History  . Not on file.   Social History Main Topics  . Smoking status: Former Smoker    Types: Cigarettes  . Smokeless tobacco: Never Used     Comment: e-cig  . Alcohol use No  . Drug use: No  . Sexual activity: Yes   Other Topics Concern  . Not on file   Social History Narrative   Lives with husband and mother in law.  Husband is 6 years older, works at [employer].  No hobbies, described herself as a "homebody"  Online courses for high school due to difficulty with social stresses.      No Known Allergies  Objective: unknown if currently breastfeeding.  Constitutional:  HENT:  Cardiovascular: RRR, S1, S2, no m/r/g.  Pulmonary/Chest: Effort normal and breath sounds normal. No respiratory distress.  Abdominal: Soft. +BS, soft, NT, ND, no rebound or guarding.  Musculoskeletal: ** Neurological: AOx3, no focal deficits. Skin: Skin is warm and dry. No rash noted. No erythema.  Psychiatric: Normal mood and affect.  Vitals reviewed  Assessment/Plan:  GYNECOLOGY CLINIC PROCEDURE NOTE  Marissa Machooni Doig is a 26 y.o. [phone] here for Nexplanon removal*** Nexplanon insertion. No GYN concerns.  Last pap smear was on *** and was normal.  No other gynecologic concerns.  Nexplanon Removal Patient was given informed consent for removal of her Nexplanon.  Appropriate time out taken. Nexplanon site identified.  Area prepped in usual sterile fashon. One ml of 1% lidocaine was used to anesthetize the area at the distal end of the implant. A small stab incision was made right beside the implant on the distal  portion.  The Nexplanon rod was grasped using hemostats and removed without difficulty.  There was minimal blood loss. There were no complications.  A small amount of antibiotic ointment and steri-strips were applied over the small incision.  A pressure bandage was applied to reduce any bruising.  The patient tolerated the procedure well and was given post procedure instructions.  Patient is planning to use *** for contraception/attempt conception.    No problem-specific Assessment & Plan notes found for this encounter.   Follow-up **.  Dani GobbleHillary Fitzgerald, MD [facility], PGY-2

## 2017-04-25 ENCOUNTER — Other Ambulatory Visit: Payer: Self-pay | Admitting: Family Medicine

## 2017-04-27 ENCOUNTER — Telehealth: Payer: Self-pay | Admitting: Family Medicine

## 2017-04-27 NOTE — Telephone Encounter (Signed)
Patient is calling to request refill of:  Name of Medication(s):  Tramadol Last date of OV:   [facility]  Will route refill request to Clinic RN.  Discussed with patient policy to call pharmacy for future refills.  Also, discussed refills may take up to 48 hours to approve or deny.  Caren MacadamJacqueline L Warner

## 2017-04-29 NOTE — Telephone Encounter (Signed)
Received. Patient has upcoming appointment. Need to see her in clinic before any additional refills are given. Thanks!

## 2017-05-04 ENCOUNTER — Ambulatory Visit (INDEPENDENT_AMBULATORY_CARE_PROVIDER_SITE_OTHER): Payer: Medicaid Other | Admitting: Internal Medicine

## 2017-05-04 ENCOUNTER — Encounter: Payer: Self-pay | Admitting: Internal Medicine

## 2017-05-04 VITALS — BP 104/80 | HR 104 | Temp 98.4°F | Ht 66.0 in | Wt 164.6 lb

## 2017-05-04 DIAGNOSIS — Z308 Encounter for other contraceptive management: Secondary | ICD-10-CM

## 2017-05-04 DIAGNOSIS — Z3009 Encounter for other general counseling and advice on contraception: Secondary | ICD-10-CM

## 2017-05-04 DIAGNOSIS — Z3046 Encounter for surveillance of implantable subdermal contraceptive: Secondary | ICD-10-CM

## 2017-05-04 LAB — POCT URINE PREGNANCY: Preg Test, Ur: NEGATIVE

## 2017-05-04 MED ORDER — TRAMADOL HCL 50 MG PO TABS: 50.0000 mg | ORAL_TABLET | Freq: Four times a day (QID) | ORAL | 0 refills | Status: DC | PRN

## 2017-05-04 NOTE — Progress Notes (Signed)
[facility] Progress Note  Subjective: Marissa Contreras is a 26 y.o. [phone] here for Nexplanon removal. This was placed 05/09/15, but patient had menorrhagia once she stopped breast feeding. No GYN concerns.  Last pap smear was on 09/11/14 and was normal.  No other gynecologic concerns. ROS: No fevers  Past Medical History:  Diagnosis Date  . Asthma   . Back pain   . Chronic back pain   . H/O varicella    No Known Allergies  Objective: Blood pressure 104/80, pulse (!) 104, temperature 98.4 F (36.9 C), temperature source Oral, height 5\' 6"  (1.676 m), weight 164 lb 9.6 oz (74.7 kg), SpO2 99 %, unknown if currently breastfeeding. Constitutional: Well-appearing female in NAD Pulmonary/Chest: Effort normal. No respiratory distress.  Musculoskeletal: Nexplanon easily palpated in L upper arm.  Skin: Skin is warm and dry. No rash noted. No erythema.  Psychiatric: Normal mood and affect.  Vitals reviewed  Assessment/Plan: Nexplanon Removal Patient was given informed consent for removal of her Nexplanon.  Appropriate time out taken. Nexplanon site identified.  Area prepped in usual sterile fashon. One ml of 1% lidocaine with epinephrine was used to anesthetize the area at the distal end of the implant. A small stab incision was made right beside the implant on the distal portion.  The Nexplanon rod was grasped using hemostats and removed without difficulty.  There was minimal blood loss. There were no complications. Steri-strips were applied over the small incision.  A pressure bandage was applied to reduce any bruising.  The patient tolerated the procedure well and was given post procedure instructions.  Patient is planning to continue OCPs for contraception/attempt conception.  Nexplanon removal - Successfully removed as above. - Counseled to keep pressure dressing on for the next day. - Reviewed return precautions.   - Patient to continue sprintec.   Follow-up in about 1 month  to discuss long-term plan for back pain with PCP. Did provide refill of tramadol #30 -- patient says such a supply usually last 2 months and taken as a last resort when pain most severe.   Marissa GobbleHillary Donnie Panik, MD [facility], PGY-3

## 2017-05-04 NOTE — Patient Instructions (Signed)
Marissa Contreras,  Your nexplanon has been removed. It is normal to have bruising and a little discomfort. Tylenol and ibuprofen can help.  Keep the pressure bandage on for 24 hours. If the strips fall off, keep a bandaid on over the next few days.  Please make an appointment with your PCP to follow-up back pain.  Best, Dr. Sampson GoonFitzgerald

## 2017-05-05 NOTE — Assessment & Plan Note (Signed)
-   Successfully removed as above. - Counseled to keep pressure dressing on for the next day. - Reviewed return precautions.   - Patient to continue sprintec.

## 2017-06-03 ENCOUNTER — Encounter: Payer: Self-pay | Admitting: Family Medicine

## 2017-06-03 ENCOUNTER — Ambulatory Visit (INDEPENDENT_AMBULATORY_CARE_PROVIDER_SITE_OTHER): Payer: Self-pay | Admitting: Family Medicine

## 2017-06-03 VITALS — BP 122/80 | HR 95 | Temp 98.9°F | Ht 66.0 in | Wt 166.6 lb

## 2017-06-03 DIAGNOSIS — R51 Headache: Secondary | ICD-10-CM

## 2017-06-03 DIAGNOSIS — G43709 Chronic migraine without aura, not intractable, without status migrainosus: Secondary | ICD-10-CM

## 2017-06-03 DIAGNOSIS — R519 Headache, unspecified: Secondary | ICD-10-CM | POA: Insufficient documentation

## 2017-06-03 DIAGNOSIS — G43909 Migraine, unspecified, not intractable, without status migrainosus: Secondary | ICD-10-CM | POA: Insufficient documentation

## 2017-06-03 DIAGNOSIS — R110 Nausea: Secondary | ICD-10-CM

## 2017-06-03 LAB — POCT URINE PREGNANCY: Preg Test, Ur: NEGATIVE

## 2017-06-03 NOTE — Progress Notes (Signed)
   Subjective:   Patient ID: Marissa Contreras    DOB: 01-10-1991, 26 y.o. female   MRN: [id number]  CC: "Headache, abdominal cramping, loose stools, nausea"  HPI: Marissa Machooni Lacey is a 26 y.o. female [facility] after removal of Nexplanon. Problems discussed today are as follows:  Headache, abdominal cramping, loose stools, nausea: All symptoms began approximately 3 days after removal of Nexplanon one month ago due to a normal uterine bleed. Vaginal bleeding has resolved and patient continues to use oral contraception. She states all of the symptoms did not start prior to removal of Nexplanon. Headache is unilateral on left behind eye lasting several hours and relieved with Tylenol. Symptoms are not triggered by eating. ROS: Denies fevers or chills, vomiting, chest pain, shortness of breath, constipation or diarrhea, melena or hematochezia, feelings of fatigue or syncope.  Complete ROS performed, see HPI for pertinent.  PMFSH: Asthma. Surgical history of tonsillectomy and vaginal D&C (missed abortion). Family history fibromyalgia, arthritis, depression, HTN, DM. Smoking status reviewed. Medications reviewed.  Objective:   BP 122/80   Pulse 95   Temp 98.9 F (37.2 C) (Oral)   Ht 5\' 6"  (1.676 m)   Wt 166 lb 9.6 oz (75.6 kg)   SpO2 99%   BMI 26.89 kg/m  Vitals and nursing note reviewed.  General: well nourished, well developed, in no acute distress with non-toxic appearance HEENT: normocephalic, atraumatic, moist mucous membranes Neck: supple, non-tender without lymphadenopathy CV: regular rate and rhythm without murmurs, rubs, or gallops, no lower extremity edema Lungs: clear to auscultation bilaterally with normal work of breathing Abdomen: soft, non-tender, non-distended, no masses or organomegaly palpable, normoactive bowel sounds Skin: warm, dry, no rashes or lesions, cap refill < 2 seconds Extremities: warm and well perfused, normal tone Psych: anxious  appearing, euthymic with congruent affect  Assessment & Plan:   Migraine Acute. Seems to be associated with nausea and loose stools. Patient concerned side effect from Nexplanon removal. Patient seems to be fixated on multiple symptoms which are unlikely related to Nexplanon. Suspect there is a component of anxiety the patient is on Celexa for depression. --Continue Tylenol as needed for headache drink plenty of water daily stools --Advised patient to allow body to recover from removal Nexplanon over the next 2-3 weeks --RTC in one month his symptoms do not improve  Orders Placed This Encounter  Procedures  . POCT urine pregnancy   No orders of the defined types were placed in this encounter.   Durward Parcelavid Milee Qualls, DO [facility], PGY-2 06/03/2017 3:02 PM

## 2017-06-03 NOTE — Assessment & Plan Note (Addendum)
Acute. Seems to be associated with nausea and loose stools. Patient concerned side effect from Nexplanon removal. Patient seems to be fixated on multiple symptoms which are unlikely related to Nexplanon. Suspect there is a component of anxiety the patient is on Celexa for depression. --Continue Tylenol as needed for headache drink plenty of water daily stools --Advised patient to allow body to recover from removal Nexplanon over the next 2-3 weeks --RTC in one month his symptoms do not improve

## 2017-06-03 NOTE — Patient Instructions (Signed)
Thank you for coming in to see us today. Please see below to review our plan for today's visit.  Her body his recovering from removal of your Nexplanon. This should be cleared from your system and approximately 2-3 weeks. Continue taking Tylenol as needed for your headaches and drink plenty of fluids. Having loose stools is okay as long as you're able to continue eating and drinking. Her cramping may be related to headache and nausea. If the symptoms seem to be persistent in one month, I would advise you to return to the clinic for further evaluation, but in the meantime I would give your body time to clear the hormones from the Nexplanon.  Please call the clinic at [phone] if your symptoms worsen or you have any concerns. It was my pleasure to see you. -- Durward Parcelavid McMullen, DO [facility], PGY-2

## 2017-06-11 ENCOUNTER — Telehealth: Payer: Self-pay

## 2017-06-11 NOTE — Telephone Encounter (Signed)
Patient's mother called and said that the patient is in severe pain and she wants a referral to [facility] ASAP. She was not interested in making an appointment at this time.Glennie Hawk

## 2017-06-14 NOTE — Telephone Encounter (Signed)
Patient does not need a referral to see OB/GYN. She can just go due to increased pelvic pain.

## 2017-06-17 ENCOUNTER — Other Ambulatory Visit: Payer: Self-pay | Admitting: Family Medicine

## 2017-06-25 NOTE — Telephone Encounter (Signed)
Attempted to call pt this morning and this afternoon, no answer/LMOVM for her to call us back. Domonique Cothran Bruna Potter, [name]

## 2017-08-04 ENCOUNTER — Emergency Department (HOSPITAL_COMMUNITY)
Admission: EM | Admit: 2017-08-04 | Discharge: 2017-08-04 | Disposition: A | Discharge status: Home / Self Care | Payer: Medicaid Other | Attending: Emergency Medicine | Admitting: Emergency Medicine

## 2017-08-04 ENCOUNTER — Encounter (HOSPITAL_COMMUNITY): Payer: Self-pay | Admitting: Emergency Medicine

## 2017-08-04 DIAGNOSIS — N946 Dysmenorrhea, unspecified: Secondary | ICD-10-CM | POA: Insufficient documentation

## 2017-08-04 DIAGNOSIS — F17210 Nicotine dependence, cigarettes, uncomplicated: Secondary | ICD-10-CM | POA: Insufficient documentation

## 2017-08-04 DIAGNOSIS — R102 Pelvic and perineal pain: Secondary | ICD-10-CM | POA: Insufficient documentation

## 2017-08-04 DIAGNOSIS — G8929 Other chronic pain: Secondary | ICD-10-CM | POA: Insufficient documentation

## 2017-08-04 DIAGNOSIS — Z79899 Other long term (current) drug therapy: Secondary | ICD-10-CM | POA: Insufficient documentation

## 2017-08-04 DIAGNOSIS — J45909 Unspecified asthma, uncomplicated: Secondary | ICD-10-CM | POA: Insufficient documentation

## 2017-08-04 LAB — WET PREP, GENITAL
Clue Cells Wet Prep HPF POC: NONE SEEN
SPERM: NONE SEEN
Trich, Wet Prep: NONE SEEN
Yeast Wet Prep HPF POC: NONE SEEN

## 2017-08-04 LAB — URINALYSIS, ROUTINE W REFLEX MICROSCOPIC
BILIRUBIN URINE: NEGATIVE
Bacteria, UA: NONE SEEN
Glucose, UA: NEGATIVE mg/dL
Ketones, ur: NEGATIVE mg/dL
LEUKOCYTES UA: NEGATIVE
NITRITE: NEGATIVE
PH: 6 (ref 5.0–8.0)
Protein, ur: NEGATIVE mg/dL
SPECIFIC GRAVITY, URINE: 1.009 (ref 1.005–1.030)

## 2017-08-04 LAB — POC URINE PREG, ED: PREG TEST UR: NEGATIVE

## 2017-08-04 MED ORDER — TRAMADOL HCL 50 MG PO TABS: 50.0000 mg | ORAL_TABLET | Freq: Four times a day (QID) | ORAL | 0 refills | Status: DC | PRN

## 2017-08-04 NOTE — ED Notes (Signed)
Pt alert & oriented x4, stable gait. Patient given discharge instructions, paperwork & prescription(s). Patient  instructed to stop at the registration desk to finish any additional paperwork. Patient verbalized understanding. Pt left department w/ no further questions. 

## 2017-08-04 NOTE — ED Triage Notes (Signed)
Pt reports she began having "light pink and watery" vaginal d/c yesterday morning. States she recently got off her BC d/t heavy bleeding. Is not using protection. Also c/o pressure in her cervix.

## 2017-08-05 NOTE — ED Provider Notes (Signed)
[facility] EMERGENCY DEPARTMENT Provider Note   CSN: [id number] Arrival date & time: 08/04/17  1907     History   Chief Complaint Chief Complaint  Patient presents with  . Vaginal Discharge    HPI Marissa Contreras is a 26 y.o. female with past medical history of asthma, chronic back pain and migraines and a history of dysmenorrhea presenting with a 1 day history of vaginal discharge which started yesterday morning.  She describes passing a moderate amount of clear fluid per her vagina earlier this morning followed by pink to now more menstrual like flow in association with low pelvic cramping.  She reports a history of dysmenorrhea for the past year which seemed to worsen after she stopped breast-feeding her now 26-year-old youngest child.  She had Nexplanon and her PCP added oral contraceptives to try to improve this symptom, however it worsened and has since stopped oral contraceptives and had the Nexplanon removed.  She denies fevers or chills, nausea or vomiting, increased low back pain, dysuria.  She is currently sexually active with monogamous partner and is not currently on any birth control.  She has inquired of a GYN referral from her PCP several months ago but this has not yet occurred.  The history is provided by the patient.    Past Medical History:  Diagnosis Date  . Asthma   . Back pain   . Chronic back pain   . H/O varicella     Patient Active Problem List   Diagnosis Date Noted  . Migraine 06/03/2017  . Menorrhagia 08/19/2016  . Mild intermittent asthma 12/20/2014  . H/O varicella   . Back pain 02/11/2012    Past Surgical History:  Procedure Laterality Date  . TONSILLECTOMY      OB History    Gravida Para Term Preterm AB Living   5 2 2   3 2    SAB TAB Ectopic Multiple Live Births   3     0 2       Home Medications    Prior to Admission medications   Medication Sig Start Date End Date Taking? Authorizing Provider  acetaminophen (TYLENOL) 500 MG  tablet Take 1,000 mg by mouth every 6 (six) hours as needed for mild pain, moderate pain or headache.     [provider]  citalopram (CELEXA) 20 MG tablet Take 2 tablets (40 mg total) by mouth daily. 01/08/17   McKeag, Janine Ores, MD  cyclobenzaprine (FLEXERIL) 10 MG tablet TAKE 1 TABLET BY MOUTH 3 TIMES DAILY AS NEEDED FOR MUSCLE SPASMS 04/27/17   Arlyce Harman, DO  gabapentin (NEURONTIN) 100 MG capsule Take 1 capsule (100 mg total) by mouth 3 (three) times daily. 04/03/17   Arlyce Harman, DO  norgestimate-ethinyl estradiol (ORTHO-CYCLEN,SPRINTEC,PREVIFEM) 0.25-35 MG-MCG tablet Take 1 tablet by mouth daily. 01/20/17   McKeag, Janine Ores, MD  traMADol (ULTRAM) 50 MG tablet Take 1 tablet (50 mg total) every 6 (six) hours as needed by mouth. 08/04/17   Burgess Amor, PA-C    Family History Family History  Problem Relation Age of Onset  . Hypertension Father   . Diabetes Father   . Fibromyalgia Mother   . Arthritis Mother   . Depression Mother   . Diabetes Paternal Grandmother   . Cancer Paternal Grandmother   . Stroke Paternal Grandmother     Social History Social History   Tobacco Use  . Smoking status: Current Every Day Smoker    Packs/day: 1.00    Types: Cigarettes  .  Smokeless tobacco: Never Used  . Tobacco comment: e-cig  Substance Use Topics  . Alcohol use: No  . Drug use: No     Allergies   Patient has no known allergies.   Review of Systems Review of Systems  Constitutional: Negative for fever.  HENT: Negative for congestion and sore throat.   Eyes: Negative.   Respiratory: Negative for chest tightness and shortness of breath.   Cardiovascular: Negative for chest pain.  Gastrointestinal: Negative for abdominal pain and nausea.  Genitourinary: Positive for menstrual problem, pelvic pain, vaginal bleeding and vaginal discharge. Negative for flank pain, genital sores and urgency.  Musculoskeletal: Negative for arthralgias, joint swelling and neck pain.  Skin:  Negative.  Negative for rash and wound.  Neurological: Negative for dizziness, weakness, light-headedness, numbness and headaches.  Psychiatric/Behavioral: Negative.      Physical Exam Updated Vital Signs BP (!) 144/91 (BP Location: Left Arm)   Pulse (!) 104   Temp 99.4 F (37.4 C) (Oral)   Resp 18   Ht 5\' 6"  (1.676 m)   Wt 77.6 kg (171 lb)   LMP 06/15/2017   SpO2 100%   BMI 27.60 kg/m   Physical Exam  Constitutional: She appears well-developed and well-nourished.  HENT:  Head: Normocephalic and atraumatic.  Eyes: Conjunctivae are normal.  Neck: Normal range of motion.  Cardiovascular: Normal rate, regular rhythm, normal heart sounds and intact distal pulses.  Pulmonary/Chest: Effort normal and breath sounds normal. She has no wheezes.  Abdominal: Soft. Bowel sounds are normal. She exhibits no distension. There is no tenderness. There is no guarding.  Genitourinary: Uterus normal. Cervix exhibits no motion tenderness, no discharge and no friability. Right adnexum displays no mass, no tenderness and no fullness. Left adnexum displays no mass, no tenderness and no fullness. No erythema, tenderness or bleeding in the vagina. No foreign body in the vagina. No signs of injury around the vagina. No vaginal discharge found.  Genitourinary Comments: Small amount of blood within the vagina.  Chaperone was present during exam.   Musculoskeletal: Normal range of motion.  Neurological: She is alert.  Skin: Skin is warm and dry.  Psychiatric: She has a normal mood and affect.  Nursing note and vitals reviewed.    ED Treatments / Results  Labs (all labs ordered are listed, but only abnormal results are displayed) Results for orders placed or performed during the hospital encounter of 08/04/17  Wet prep, genital  Result Value Ref Range   Yeast Wet Prep HPF POC NONE SEEN NONE SEEN   Trich, Wet Prep NONE SEEN NONE SEEN   Clue Cells Wet Prep HPF POC NONE SEEN NONE SEEN   WBC, Wet  Prep HPF POC FEW (A) NONE SEEN   Sperm NONE SEEN   Urinalysis, Routine w reflex microscopic  Result Value Ref Range   Color, Urine STRAW (A) YELLOW   APPearance CLEAR CLEAR   Specific Gravity, Urine 1.009 1.005 - 1.030   pH 6.0 5.0 - 8.0   Glucose, UA NEGATIVE NEGATIVE mg/dL   Hgb urine dipstick SMALL (A) NEGATIVE   Bilirubin Urine NEGATIVE NEGATIVE   Ketones, ur NEGATIVE NEGATIVE mg/dL   Protein, ur NEGATIVE NEGATIVE mg/dL   Nitrite NEGATIVE NEGATIVE   Leukocytes, UA NEGATIVE NEGATIVE   RBC / HPF 0-5 0 - 5 RBC/hpf   WBC, UA 0-5 0 - 5 WBC/hpf   Bacteria, UA NONE SEEN NONE SEEN   Squamous Epithelial / LPF 0-5 (A) NONE SEEN   Mucus PRESENT  POC urine preg, ED  Result Value Ref Range   Preg Test, Ur NEGATIVE NEGATIVE   No results found.   EKG  EKG Interpretation None       Radiology No results found.  Procedures Procedures (including critical care time)  Medications Ordered in ED Medications - No data to display   Initial Impression / Assessment and Plan / ED Course  I have reviewed the triage vital signs and the nursing notes.  Pertinent labs & imaging results that were available during my care of the patient were reviewed by me and considered in my medical decision making (see chart for details).     Labs reviewed and discussed with patient, she is aware that cultures are pending.  She has no exam findings suggesting STD at this time.  I suspect this is simple dysmenorrhea.  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  I have referred her to a gynecologist for further evaluation and management of her symptoms.  PRN follow-up anticipated.  Final Clinical Impressions(s) / ED Diagnoses   Final diagnoses:  Dysmenorrhea    ED Discharge Orders        Ordered    traMADol (ULTRAM) 50 MG tablet  Every 6  hours PRN     08/04/17 2153       Burgess Amordol, Coraline Talwar, PA-C 08/05/17 1423    Loren RacerYelverton, David, MD 08/05/17 [phone]

## 2017-08-06 LAB — GC/CHLAMYDIA PROBE AMP (~~LOC~~) NOT AT ARMC
Chlamydia: NEGATIVE
NEISSERIA GONORRHEA: NEGATIVE

## 2017-09-09 ENCOUNTER — Other Ambulatory Visit: Payer: Self-pay

## 2017-09-09 ENCOUNTER — Encounter: Payer: Self-pay | Admitting: Family Medicine

## 2017-09-09 ENCOUNTER — Ambulatory Visit (INDEPENDENT_AMBULATORY_CARE_PROVIDER_SITE_OTHER): Payer: Self-pay | Admitting: Family Medicine

## 2017-09-09 ENCOUNTER — Other Ambulatory Visit: Payer: Self-pay | Admitting: Family Medicine

## 2017-09-09 VITALS — BP 114/78 | HR 101 | Temp 98.6°F | Wt 180.0 lb

## 2017-09-09 DIAGNOSIS — Z3A01 Less than 8 weeks gestation of pregnancy: Secondary | ICD-10-CM

## 2017-09-09 DIAGNOSIS — N3000 Acute cystitis without hematuria: Secondary | ICD-10-CM

## 2017-09-09 DIAGNOSIS — Z3200 Encounter for pregnancy test, result unknown: Secondary | ICD-10-CM

## 2017-09-09 DIAGNOSIS — N390 Urinary tract infection, site not specified: Secondary | ICD-10-CM | POA: Insufficient documentation

## 2017-09-09 HISTORY — DX: Urinary tract infection, site not specified: N39.0

## 2017-09-09 LAB — POCT URINALYSIS DIP (MANUAL ENTRY)
BILIRUBIN UA: NEGATIVE
Blood, UA: NEGATIVE
GLUCOSE UA: NEGATIVE mg/dL
Ketones, POC UA: NEGATIVE mg/dL
Leukocytes, UA: NEGATIVE
Nitrite, UA: NEGATIVE
Spec Grav, UA: >=1.03 — AB (ref 1.010–1.025)
UROBILINOGEN UA: 2 U/dL — AB
pH, UA: 6 (ref 5.0–8.0)

## 2017-09-09 LAB — POCT URINE PREGNANCY: Preg Test, Ur: POSITIVE — AB

## 2017-09-09 MED ORDER — PRENATAL VITAMIN 27-0.8 MG PO TABS: 1.0000 | ORAL_TABLET | Freq: Every day | ORAL | 1 refills | Status: DC

## 2017-09-09 MED ORDER — CEPHALEXIN 500 MG PO CAPS: 500.0000 mg | ORAL_CAPSULE | Freq: Two times a day (BID) | ORAL | 0 refills | Status: AC

## 2017-09-09 NOTE — Assessment & Plan Note (Signed)
Patient reporting symptoms of urgency and burning. Denies frequency, odor, and color change. UA neg for nitrites and leukocytes.  -prescription for Keflex 500mg  bid given  -follow up as needed  -urine culture ordered

## 2017-09-09 NOTE — Progress Notes (Signed)
   Subjective:    Patient ID: Marissa Contreras, female    DOB: 27-Oct-1990, 26 y.o.   MRN: [id number]   CC: Positive pregnancy test   HPI: Positive pregnancy test Patient reports taking home pregnancy test this morning that was positive.  Pregnancy is unplanned, but desires to continue with pregnancy. Patient was having problems with OCPs (side effects) and stopped taking them 3 months ago. Patient then had 2 normal periods (lasting 7-9days) since stopping OCPs.  LMP on 08/04/2017  Patient smokes 1ppd, but states she will quit now that she knows she is pregnant  Denies alcohol or illicit drug use  Patient has been pregnant 7 times total and has had 4 miscarriages, all occurring before 15 weeks.  Patient has 2 living children  Discussed medication list with patient and patient desires to take Celexa during pregnancy but will discuss with PCP during initial prenatal visit   UTI Patient reports symptoms of urgency and burning on urination for the past week. Symptoms have been intermittent but have been progressively worsening. Denies frequency, odor, or color change. Patient has had UTI's in the past and this feels similar to past UTIs.   Objective:  BP 114/78   Pulse (!) 101   Temp 98.6 F (37 C) (Oral)   Wt 180 lb (81.6 kg)   SpO2 99%   BMI 29.05 kg/m  Vitals and nursing note reviewed  General: well nourished, in no acute distress Cardiac: RRR, clear S1 and S2, no murmurs, rubs, or gallops Respiratory: clear to auscultation bilaterally, no increased work of breathing Abdomen: soft, nontender, nondistended, no masses or organomegaly. Bowel sounds present Skin: warm and dry, no rashes noted Neuro: alert and oriented, no focal deficit   Assessment & Plan:    Pregnancy Patient today with positive pregnancy test.  Previous sickle cell negative. Have discontinued home meds of tramadol, gabapentin, and flexeril. Patient requesting to continue celexa after being informed of risks and  benefits. Desires to discuss with PCP during new prenatal visit.  -prenatal vitamin prescription given, advised to use OTC prenatal vitamins with folic acid if insurance does not cover these  -New OB Labs obtained: OB panel including HIV, OB urine culture, urine dipstick (POCT 6). -will need flu shot  -advised to follow up with behavior health, especially if discontinuing celexa. Patient agreed to think about it and would inform us of decision during next appointment  -letter for medicaid stating positive pregnancy test given to patient  -informed to follow up ASAP for initial prenatal visit  UTI (urinary tract infection) Patient reporting symptoms of urgency and burning. Denies frequency, odor, and color change. UA neg for nitrites and leukocytes.  -prescription for Keflex 500mg  bid given  -follow up as needed  -urine culture ordered    Return in about 1 week (around 09/16/2017) for initial prenatal visit .   Oralia ManisSherin Kristelle Cavallaro, DO, PGY-1

## 2017-09-09 NOTE — Assessment & Plan Note (Signed)
Patient today with positive pregnancy test.  Previous sickle cell negative. Have discontinued home meds of tramadol, gabapentin, and flexeril. Patient requesting to continue celexa after being informed of risks and benefits. Desires to discuss with PCP during new prenatal visit.  -prenatal vitamin prescription given, advised to use OTC prenatal vitamins with folic acid if insurance does not cover these  -New OB Labs obtained: OB panel including HIV, OB urine culture, urine dipstick (POCT 6). -will need flu shot  -advised to follow up with behavior health, especially if discontinuing celexa. Patient agreed to think about it and would inform us of decision during next appointment  -letter for medicaid stating positive pregnancy test given to patient  -informed to follow up ASAP for initial prenatal visit

## 2017-09-09 NOTE — Patient Instructions (Addendum)
Pregnancy Test Information What is a pregnancy test? A pregnancy test is used to detect the presence of human chorionic gonadotropin (hCG) in a sample of your urine or blood. hCG is a hormone produced by the cells of the placenta. The placenta is the organ that forms to nourish and support a developing baby. This test requires a sample of either blood or urine. A pregnancy test determines whether you are pregnant or not. How are pregnancy tests done? Pregnancy tests are done using a home pregnancy test or having a blood or urine test done at your health care provider's office. Home pregnancy tests require a urine sample.  Most kits use a plastic testing device with a strip of paper that indicates whether there is hCG in your urine.  Follow the test instructions very carefully.  After you urinate on the test stick, markings will appear to let you know whether you are pregnant.  For best results, use your first urine of the morning. That is when the concentration of hCG is highest.  Having a blood test to check for pregnancy requires a sample of blood drawn from a vein in your hand or arm. Your health care provider will send your sample to a lab for testing. Results of a pregnancy test will be positive or negative. Is one type of pregnancy test better than another? In some cases, a blood test will return a positive result even if a urine test was negative because blood tests are more sensitive. This means blood tests can detect hCG earlier than home pregnancy tests. How accurate are home pregnancy tests? Both types of pregnancy tests are very accurate.  A blood test is about 98% accurate.  When you are far enough along in your pregnancy and when used correctly, home pregnancy tests are equally accurate.  Can anything interfere with home pregnancy test results It is possible for certain conditions to cause an inaccurate test result (false positive or falsenegative).  A false positive is a  positive test result when you are not pregnant. This can happen if you: ? Are taking certain medicines, including anticonvulsants or tranquilizers. ? Have certain proteins in your blood.  A false negative is a negative test result when you are pregnant. This can happen if you: ? Took the test before there was enough hCG to detect. A pregnancy test will not be positive in most women until 3-4 weeks after conception. ? Drank a lot of liquid before the test. Diluted urine samples can sometimes give an inaccurate result. ? Take certain medicines, such as water pills (diuretics) or some antihistamines.  What should I do if I have a positive pregnancy test? If you have a positive pregnancy test, schedule an appointment with your health care provider. You might need additional testing to confirm the pregnancy. In the meantime, begin taking a prenatal vitamin, stop smoking, stop drinking alcohol, and do not use street drugs. Talk to your health care provider about how to take care of yourself during your pregnancy. Ask about what to expect from the care you will need throughout pregnancy (prenatal care). This information is not intended to replace advice given to you by your health care provider. Make sure you discuss any questions you have with your health care provider. Document Released: 09/11/2003 Document Revised: 08/05/2016 Document Reviewed: 01/03/2014 Elsevier Interactive Patient Education  [employer]2018 Else[employer] Inc.    It was a pleasure seeing you today.   Today we discussed your pregnancy test  Your test was  positive meaning you are pregnant  Please follow up in 1 week or sooner for an initial prenatal visit if symptoms persist or worsen. Please call the clinic immediately if you have any concerns.   Please take a prenatal vitamin with folic acid.  Please take keflex twice a day for 7 days for a UTI.   I also recommend meeting with behavioral health.   Our clinic's number is [phone].  Please call with questions or concerns.   Please go to the emergency room if you have spotting, cramping, or other concerns.   Thank you,  Oralia ManisSherin Lorry Furber, DO

## 2017-09-10 LAB — OBSTETRIC PANEL, INCLUDING HIV
Antibody Screen: NEGATIVE
Basophils Absolute: 0 10*3/uL (ref 0.0–0.2)
Basos: 0 %
EOS (ABSOLUTE): 0 10*3/uL (ref 0.0–0.4)
EOS: 0 %
HEMOGLOBIN: 13.2 g/dL (ref 11.1–15.9)
HEP B S AG: NEGATIVE
HIV Screen 4th Generation wRfx: NONREACTIVE
Hematocrit: 40 % (ref 34.0–46.6)
IMMATURE GRANS (ABS): 0 10*3/uL (ref 0.0–0.1)
IMMATURE GRANULOCYTES: 0 %
LYMPHS: 23 %
Lymphocytes Absolute: 1.9 10*3/uL (ref 0.7–3.1)
MCH: 29.1 pg (ref 26.6–33.0)
MCHC: 33 g/dL (ref 31.5–35.7)
MCV: 88 fL (ref 79–97)
MONOCYTES: 7 %
Monocytes Absolute: 0.6 10*3/uL (ref 0.1–0.9)
NEUTROS PCT: 70 %
Neutrophils Absolute: 5.9 10*3/uL (ref 1.4–7.0)
Platelets: 295 10*3/uL (ref 150–379)
RBC: 4.54 x10E6/uL (ref 3.77–5.28)
RDW: 12.9 % (ref 12.3–15.4)
RH TYPE: POSITIVE
RPR: NONREACTIVE
RUBELLA: 2.34 {index} (ref >0.99)
WBC: 8.5 10*3/uL (ref 3.4–10.8)

## 2017-09-11 LAB — URINE CULTURE, OB REFLEX: Organism ID, Bacteria: NO GROWTH

## 2017-09-11 LAB — CULTURE, OB URINE

## 2017-09-17 ENCOUNTER — Telehealth: Payer: Self-pay | Admitting: *Deleted

## 2017-09-17 NOTE — Telephone Encounter (Signed)
-----   Message from Oralia ManisSherin Abraham, DO sent at 09/10/2017  9:52 AM EST ----- Attempted to call patient to inform her of results. Patient did not pick up, have left voicemail to call clinic. Please call patient to inform her of results. Also, please schedule patient for NEW OB 60 min visit with PCP.

## 2017-09-17 NOTE — Telephone Encounter (Signed)
Spoke with pt and gave her a NEW OB appt with pcp. Deseree Bruna PotterBlount, [name]

## 2017-09-22 NOTE — L&D Delivery Note (Signed)
OB/GYN Faculty Practice Delivery Note  Marissa Contreras is a 27 y.o. [id number] s/p SVD at 3835w5d. She was admitted for SROM, spontaneous onset of labor.   ROM: 10h 8163m with meconium fluid GBS Status: positive (1st PCN - 1458, 2nd - 1854)  Maximum Maternal Temperature: Temp (24hrs), Avg:98.3 F (36.8 C), Min:97.9 F (36.6 C), Max:98.8 F (37.1 C)  Labor Progress: . Admitted with PROM at 1000 and pitocin started   Delivery: Called to room and patient was complete and pushing. Head delivered ROA. No nuchal cord present. Shoulder and body delivered in usual fashion. Infant with spontaneous cry, placed on mother's abdomen, dried and stimulated. Cord clamped x 2 after 1-minute delay, and cut by provider. Cord blood drawn. Placenta delivered spontaneously with gentle cord traction. Fundus firm with massage and Pitocin. Labia, perineum, vagina, and cervix inspected inspected with left labial laceration which was repaired in the usual fashion with 4-0 vicryl.   Placenta: spontaneous, intact  3 vessel cord Complications: none Lacerations: left labial, repaired with 3-0 vicryl EBL: 100  Postpartum Planning [ ]  message to sent to schedule follow-up  [ ]  vaccines UTD -- desires BTL, breastfeeding   Infant: female  APGAR 9,9  weight 4110g    Marissa Whitelaw S. Earlene PlaterWallace, DO OB/GYN Fellow, Faculty Practice

## 2017-09-28 ENCOUNTER — Encounter: Payer: Self-pay | Admitting: Family Medicine

## 2017-09-28 ENCOUNTER — Ambulatory Visit (INDEPENDENT_AMBULATORY_CARE_PROVIDER_SITE_OTHER): Payer: Medicaid Other | Admitting: Family Medicine

## 2017-09-28 ENCOUNTER — Other Ambulatory Visit (HOSPITAL_COMMUNITY)
Admission: RE | Admit: 2017-09-28 | Discharge: 2017-09-28 | Disposition: A | Discharge status: Home / Self Care | Payer: Medicaid Other | Source: Ambulatory Visit | Attending: Family Medicine | Admitting: Family Medicine

## 2017-09-28 ENCOUNTER — Other Ambulatory Visit: Payer: Self-pay

## 2017-09-28 VITALS — BP 110/70 | HR 88 | Temp 98.6°F | Wt 178.0 lb

## 2017-09-28 DIAGNOSIS — Z3A08 8 weeks gestation of pregnancy: Secondary | ICD-10-CM | POA: Diagnosis not present

## 2017-09-28 DIAGNOSIS — Z113 Encounter for screening for infections with a predominantly sexual mode of transmission: Secondary | ICD-10-CM

## 2017-09-28 DIAGNOSIS — Z124 Encounter for screening for malignant neoplasm of cervix: Secondary | ICD-10-CM

## 2017-09-28 DIAGNOSIS — Z23 Encounter for immunization: Secondary | ICD-10-CM | POA: Diagnosis not present

## 2017-09-28 NOTE — Progress Notes (Signed)
Subjective: Chief Complaint  Patient presents with  . Initial Prenatal Visit     HPI: Marissa Contreras is a 27 y.o. presenting to clinic today to discuss the following:  1 Visit for New Pregnancy  Patient is being seen today for her initial OB visit as she has just recently found out she is pregnant. Patient is [id number]. Her last menstrual period is estimated to be 08/04/2017 although she is somewhat unsure of this. She has no concerns about pregnancy at this time and feels she has good support system in place. She is somewhat concerned about potential for miscarriage given her history. Patient has already had initial OB labs. Denies any ETOH or drug use. Was smoking but quit 4 days ago.  Patient denies history of diabetes, gestational diabetes, preeclampsia, or high blood pressure before pregnancy.  Patient is due for Pap smear as she has not had one in over 3 years and we will also get GC/CC today as well.  Patient denies headache, vision changes, chest pain, difficulty breathing, abdominal pain, painful or burning on urination.  Patient does endorse some SOB on exertion but states it is mild. Patient is having morning sickness, had this with previous pregnancies.  Health Maintenance: flu vaccine     ROS noted in HPI.   Past Medical, Surgical, Social, and Family History Reviewed & Updated per EMR.   Pertinent Historical Findings include:   Social History   Tobacco Use  Smoking Status Former Smoker  . Packs/day: 1.00  . Types: Cigarettes  . Last attempt to quit: 09/24/2017  . Years since quitting: 0.0  Smokeless Tobacco Never Used  Tobacco Comment   quit for pregnancy      Objective: BP 110/70   Pulse 88   Temp 98.6 F (37 C)   Wt 178 lb (80.7 kg)   LMP 06/15/2017   BMI 28.73 kg/m  Vitals and nursing notes reviewed  Physical Exam  Constitutional: She is oriented to person, place, and time and well-developed, well-nourished, and in no distress. No distress.    HENT:  Head: Normocephalic and atraumatic.  Eyes: EOM are normal. Pupils are equal, round, and reactive to light.  Neck: Normal range of motion. Neck supple.  Cardiovascular: Normal rate, regular rhythm, normal heart sounds and intact distal pulses.  No murmur heard. Pulmonary/Chest: Effort normal and breath sounds normal. She has no wheezes. She has no rales.  Abdominal: Soft. Bowel sounds are normal. She exhibits no distension. There is no tenderness. There is no rebound and no guarding.  Genitourinary: Vagina normal and cervix normal. No vaginal discharge found.  Musculoskeletal: Normal range of motion. She exhibits no edema.  Neurological: She is alert and oriented to person, place, and time.  Skin: Skin is warm and dry. No rash noted. No erythema.  Psychiatric: Mood normal.     No results found for this or any previous visit (from the past 72 hour(s)).  Assessment/Plan:  Supervision of other normal pregnancy, antepartum Patient will be referred to high risk clinic due to history of 4 previous spontaneous abortions.   Patient also will need sonogram for dating purposes as she is not confident about gestational age.   Patient also desires 11-13 week integrated screening for baby. All initial prenatal labs have been obtained and are so far normal.  Patient had pap smear on day of appointment and results were negative for any neoplasm or malignancy.  GC/CC was negative.  If high risk clinic feels she does  not need their services I will continue to follow up with patient and see her for her 10-12 week visit.     PATIENT EDUCATION PROVIDED: See AVS    Diagnosis and plan along with any newly prescribed medication(s) were discussed in detail with this patient today. The patient verbalized understanding and agreed with the plan. Patient advised if symptoms worsen return to clinic or ER.   Health Maintainance:   Orders Placed This Encounter  Procedures  . Flu Vaccine QUAD  36+ mos IM    No orders of the defined types were placed in this encounter.    Jules Schickim Rakhi Romagnoli, DO 09/28/2017, 12:18 PM PGY-1, [facility]

## 2017-09-28 NOTE — Patient Instructions (Signed)
It was great to see you again today! Thank you for letting me participate in your care!  Today, we discussed your current pregnancy. All your prenatal labs have been performed and were reviewed and were normal. The next step is to get a dating ultrasound so we can get a more accurate picture of the date of pregnancy.  Due to your history of miscarriages, I am referring you to the high risk OB clinic. They may decide to take over your care for this pregnancy but you are always welcome to return to this clinic if you chose. If you do not hear anything from the high risk OB clinic please contact them at  [phone].  Today we got a pap smear since it has been 3 years since your last one. I will also order a integrated screening for the 11-13 week period.  If you have any questions or concerns please do not hesitate to call the office.  Be well, Marissa Schickim Brook Geraci, DO PGY-1, [facility]

## 2017-09-29 ENCOUNTER — Telehealth: Payer: Self-pay | Admitting: Family Medicine

## 2017-09-29 ENCOUNTER — Emergency Department (HOSPITAL_COMMUNITY)
Admission: EM | Admit: 2017-09-29 | Discharge: 2017-09-29 | Disposition: A | Discharge status: Home / Self Care | Payer: Self-pay | Attending: Emergency Medicine | Admitting: Emergency Medicine

## 2017-09-29 ENCOUNTER — Encounter (HOSPITAL_COMMUNITY): Payer: Self-pay | Admitting: *Deleted

## 2017-09-29 DIAGNOSIS — Z5321 Procedure and treatment not carried out due to patient leaving prior to being seen by health care provider: Secondary | ICD-10-CM | POA: Insufficient documentation

## 2017-09-29 DIAGNOSIS — R51 Headache: Secondary | ICD-10-CM | POA: Insufficient documentation

## 2017-09-29 LAB — CERVICOVAGINAL ANCILLARY ONLY
CHLAMYDIA, DNA PROBE: NEGATIVE
NEISSERIA GONORRHEA: NEGATIVE

## 2017-09-29 LAB — CYTOLOGY - PAP: Diagnosis: NEGATIVE

## 2017-09-29 NOTE — ED Triage Notes (Signed)
Pt with HA around 1200 with numbness to bilateral arms and face and weakness to bilateral legs, laid down for a nap, then woke up around 1400 numbness still to face and unable to speak normally. Pt denies hx of migraines but hx of headaches. Pt with positive pregnancy test, pt believes around 8 weeks.

## 2017-09-29 NOTE — ED Notes (Signed)
Pt informed Registration clerk at this time that she was leaving. Pt left with husband in NAD. [facility].

## 2017-09-30 ENCOUNTER — Encounter: Payer: Self-pay | Admitting: Family Medicine

## 2017-09-30 NOTE — Progress Notes (Signed)
Sent letter to patient informing her of results

## 2017-10-01 ENCOUNTER — Other Ambulatory Visit: Payer: Self-pay

## 2017-10-01 ENCOUNTER — Ambulatory Visit (INDEPENDENT_AMBULATORY_CARE_PROVIDER_SITE_OTHER): Payer: Medicaid Other | Admitting: Family Medicine

## 2017-10-01 ENCOUNTER — Encounter: Payer: Self-pay | Admitting: Family Medicine

## 2017-10-01 VITALS — BP 110/80 | HR 92 | Temp 98.5°F | Ht 66.0 in | Wt 175.0 lb

## 2017-10-01 DIAGNOSIS — Z124 Encounter for screening for malignant neoplasm of cervix: Secondary | ICD-10-CM | POA: Insufficient documentation

## 2017-10-01 DIAGNOSIS — Z3480 Encounter for supervision of other normal pregnancy, unspecified trimester: Secondary | ICD-10-CM | POA: Diagnosis not present

## 2017-10-01 DIAGNOSIS — Z113 Encounter for screening for infections with a predominantly sexual mode of transmission: Secondary | ICD-10-CM | POA: Insufficient documentation

## 2017-10-01 DIAGNOSIS — G43119 Migraine with aura, intractable, without status migrainosus: Secondary | ICD-10-CM

## 2017-10-01 DIAGNOSIS — Z23 Encounter for immunization: Secondary | ICD-10-CM | POA: Insufficient documentation

## 2017-10-01 MED ORDER — CYCLOBENZAPRINE HCL 10 MG PO TABS: 10.0000 mg | ORAL_TABLET | Freq: Three times a day (TID) | ORAL | 0 refills | Status: DC | PRN

## 2017-10-01 NOTE — Progress Notes (Signed)
   Subjective:    Patient ID: Marissa Contreras is a 27 y.o. female presenting with Numbness  on 10/01/2017  HPI: Feeling very tired. Got into bath and head began her head began pounding and her vision got bad where things appeared out of focus and began seeing shapes. This lasted about an hour. Fell asleep and when awakened she noted that she could not find the names of objects, face was numb over her mouth and right side of her face. Felt that both hands and legs were weak. Could not form a full sentence. Symptoms lasted about 3 hours. Has h/o light sensitivity. Does not usually have migraines. Has not had one in years. Came here and went to the ER and waited for 3 hours. Then went home due to impatience. No recent viral illnesses. No issues in prior pregnancy. 7th pregnancy and 4 SABs, including one 15 wk loss. Has had w/u including thrombophilia and parental chromosomes, all of which were normal. All symptoms have resolved except for headache which lingers. Pain is between 2-8. No exacerbating or alleviating factors. Pain is constant and sharp, it occurs on the right and posteriorly. Notes that she is having regular headaches after Nexplanon and then on OC's. She is not sleeping at all. No bleeding, but some uterine cramping.  Review of Systems  Constitutional: Negative for chills and fever.  Respiratory: Negative for shortness of breath.   Cardiovascular: Negative for chest pain.  Gastrointestinal: Negative for abdominal pain, nausea and vomiting.  Genitourinary: Negative for dysuria.  Skin: Negative for rash.      Objective:    BP 110/80   Pulse 92   Temp 98.5 F (36.9 C) (Oral)   Ht 5\' 6"  (1.676 m)   Wt 175 lb (79.4 kg)   LMP 06/15/2017   SpO2 99%   Breastfeeding? No   BMI 28.25 kg/m  Physical Exam  Constitutional: She is oriented to person, place, and time. She appears well-developed and well-nourished. No distress.  HENT:  Head: Normocephalic and atraumatic.  Eyes: No scleral  icterus.  Neck: Neck supple.  Cardiovascular: Normal rate.  Pulmonary/Chest: Effort normal.  Abdominal: Soft.  Neurological: She is alert and oriented to person, place, and time. She has normal strength and normal reflexes. No cranial nerve deficit or sensory deficit. Gait normal.  Skin: Skin is warm and dry.  Psychiatric: She has a normal mood and affect.        Assessment & Plan:   Problem List Items Addressed This Visit      Unprioritized   Supervision of other normal pregnancy, antepartum    Has dating u/s scheduled--return for New OB ASAP      Migraine - Primary    Suspect vascular migraine, given symptoms, resolution. Advised to return to ED with symptoms of stroke if returns. Needs to ensure she is getting enough sleep. Trial of flexeril during pregnancy if needed.      Relevant Medications   cyclobenzaprine (FLEXERIL) 10 MG tablet      Total face-to-face time with patient: 25 minutes. Over 50% of encounter was spent on counseling and coordination of care. No Follow-up on file.  Reva Boresanya S Glenn Christo 10/01/2017 3:52 PM

## 2017-10-01 NOTE — Assessment & Plan Note (Signed)
Patient will be referred to high risk clinic due to history of 4 previous spontaneous abortions.   Patient also will need sonogram for dating purposes as she is not confident about gestational age.   Patient also desires 11-13 week integrated screening for baby. All initial prenatal labs have been obtained and are so far normal.  Patient had pap smear on day of appointment and results were negative for any neoplasm or malignancy.  GC/CC was negative.  If high risk clinic feels she does not need their services I will continue to follow up with patient and see her for her 10-12 week visit.

## 2017-10-01 NOTE — Assessment & Plan Note (Signed)
Has dating u/s scheduled--return for New OB ASAP

## 2017-10-01 NOTE — Assessment & Plan Note (Signed)
Suspect vascular migraine, given symptoms, resolution. Advised to return to ED with symptoms of stroke if returns. Needs to ensure she is getting enough sleep. Trial of flexeril during pregnancy if needed.

## 2017-10-01 NOTE — Patient Instructions (Signed)
Migraine Headache A migraine headache is an intense, throbbing pain on one side or both sides of the head. Migraines may also cause other symptoms, such as nausea, vomiting, and sensitivity to light and noise. What are the causes? Doing or taking certain things may also trigger migraines, such as:  Alcohol.  Smoking.  Medicines, such as: ? Medicine used to treat chest pain (nitroglycerine). ? Birth control pills. ? Estrogen pills. ? Certain blood pressure medicines.  Aged cheeses, chocolate, or caffeine.  Foods or drinks that contain nitrates, glutamate, aspartame, or tyramine.  Physical activity.  Other things that may trigger a migraine include:  Menstruation.  Pregnancy.  Hunger.  Stress, lack of sleep, too much sleep, or fatigue.  Weather changes.  What increases the risk? The following factors may make you more likely to experience migraine headaches:  Age. Risk increases with age.  Family history of migraine headaches.  Being Caucasian.  Depression and anxiety.  Obesity.  Being a woman.  Having a hole in the heart (patent foramen ovale) or other heart problems.  What are the signs or symptoms? The main symptom of this condition is pulsating or throbbing pain. Pain may:  Happen in any area of the head, such as on one side or both sides.  Interfere with daily activities.  Get worse with physical activity.  Get worse with exposure to bright lights or loud noises.  Other symptoms may include:  Nausea.  Vomiting.  Dizziness.  General sensitivity to bright lights, loud noises, or smells.  Before you get a migraine, you may get warning signs that a migraine is developing (aura). An aura may include:  Seeing flashing lights or having blind spots.  Seeing bright spots, halos, or zigzag lines.  Having tunnel vision or blurred vision.  Having numbness or a tingling feeling.  Having trouble talking.  Having muscle weakness.  How is this  diagnosed? A migraine headache can be diagnosed based on:  Your symptoms.  A physical exam.  Tests, such as CT scan or MRI of the head. These imaging tests can help rule out other causes of headaches.  Taking fluid from the spine (lumbar puncture) and analyzing it (cerebrospinal fluid analysis, or CSF analysis).  How is this treated? A migraine headache is usually treated with medicines that:  Relieve pain.  Relieve nausea.  Prevent migraines from coming back.  Treatment may also include:  Acupuncture.  Lifestyle changes like avoiding foods that trigger migraines.  Follow these instructions at home: Medicines  Take over-the-counter and prescription medicines only as told by your health care provider.  Do not drive or use heavy machinery while taking prescription pain medicine.  To prevent or treat constipation while you are taking prescription pain medicine, your health care provider may recommend that you: ? Drink enough fluid to keep your urine clear or pale yellow. ? Take over-the-counter or prescription medicines. ? Eat foods that are high in fiber, such as fresh fruits and vegetables, whole grains, and beans. ? Limit foods that are high in fat and processed sugars, such as fried and sweet foods. Lifestyle  Avoid alcohol use.  Do not use any products that contain nicotine or tobacco, such as cigarettes and e-cigarettes. If you need help quitting, ask your health care provider.  Get at least 8 hours of sleep every night.  Limit your stress. General instructions   Keep a journal to find out what may trigger your migraine headaches. For example, write down: ? What you eat and   drink. ? How much sleep you get. ? Any change to your diet or medicines.  If you have a migraine: ? Avoid things that make your symptoms worse, such as bright lights. ? It may help to lie down in a dark, quiet room. ? Do not drive or use heavy machinery. ? Ask your health care provider  what activities are safe for you while you are experiencing symptoms.  Keep all follow-up visits as told by your health care provider. This is important. Contact a health care provider if:  You develop symptoms that are different or more severe than your usual migraine symptoms. Get help right away if:  Your migraine becomes severe.  You have a fever.  You have a stiff neck.  You have vision loss.  Your muscles feel weak or like you cannot control them.  You start to lose your balance often.  You develop trouble walking.  You faint. This information is not intended to replace advice given to you by your health care provider. Make sure you discuss any questions you have with your health care provider. Document Released: 09/08/2005 Document Revised: 03/28/2016 Document Reviewed: 02/25/2016 Elsevier Interactive Patient Education  2017 [employer] Inc[employer]   

## 2017-10-05 ENCOUNTER — Ambulatory Visit (HOSPITAL_COMMUNITY)
Admission: RE | Admit: 2017-10-05 | Discharge: 2017-10-05 | Disposition: A | Discharge status: Home / Self Care | Payer: Self-pay | Source: Ambulatory Visit | Attending: Family Medicine | Admitting: Family Medicine

## 2017-10-05 DIAGNOSIS — Z3687 Encounter for antenatal screening for uncertain dates: Secondary | ICD-10-CM | POA: Insufficient documentation

## 2017-10-05 DIAGNOSIS — Z3A08 8 weeks gestation of pregnancy: Secondary | ICD-10-CM | POA: Insufficient documentation

## 2017-10-05 DIAGNOSIS — O209 Hemorrhage in early pregnancy, unspecified: Secondary | ICD-10-CM | POA: Insufficient documentation

## 2017-10-21 ENCOUNTER — Encounter: Payer: Self-pay | Admitting: Family Medicine

## 2017-10-21 ENCOUNTER — Ambulatory Visit (INDEPENDENT_AMBULATORY_CARE_PROVIDER_SITE_OTHER): Payer: Medicaid Other | Admitting: Family Medicine

## 2017-10-21 ENCOUNTER — Other Ambulatory Visit: Payer: Self-pay

## 2017-10-21 VITALS — BP 102/74 | HR 101 | Temp 98.7°F | Wt 176.2 lb

## 2017-10-21 DIAGNOSIS — Z3480 Encounter for supervision of other normal pregnancy, unspecified trimester: Secondary | ICD-10-CM | POA: Diagnosis not present

## 2017-10-21 NOTE — Progress Notes (Signed)
Subjective: Chief Complaint  Patient presents with  . Routine Prenatal Visit     HPI: Marissa Contreras is a 27 y.o. presenting to clinic today to discuss the following:  Patient [id number] presents for her second OB follow up appointment at 5542w1d pregnant with EDD of 05/11/2018. She has been referred to high risk OB due to 3 previous miscarriages for which she will be seen next week. She states on Friday she had cramping, 1/2 day of yellow mucous discharge and then more the next day but has since subsized. She denies any gross bleeding. Due to her previous miscarriages she is somewhat concerned that something may have happened to her baby. Bedsides the light cramps that have now resolved, no pain since and no pain today. She also had intermittent burning of her left breast but no skin changes, no swelling, no discharge.  No fevers, positive for chills, cough, sore throat, constipation, diarrhea, no pain or burning on urination, no increased urinary frequency.  Health Maintenance: none today     ROS noted in HPI.   Past Medical, Surgical, Social, and Family History Reviewed & Updated per EMR.   Pertinent Historical Findings include:   Social History   Tobacco Use  Smoking Status Former Smoker  . Packs/day: 1.00  . Types: Cigarettes  . Last attempt to quit: 09/24/2017  . Years since quitting: 0.0  Smokeless Tobacco Never Used  Tobacco Comment   quit for pregnancy      Objective: BP 102/74   Pulse (!) 101   Temp 98.7 F (37.1 C)   Wt 176 lb 3.2 oz (79.9 kg)   LMP 08/04/2017 (Approximate)   BMI 28.44 kg/m  Vitals and nursing notes reviewed  Physical Exam  Constitutional: She is oriented to person, place, and time and well-developed, well-nourished, and in no distress. No distress.  HENT:  Head: Normocephalic and atraumatic.  Eyes: EOM are normal. Pupils are equal, round, and reactive to light.  Neck: Normal range of motion. Neck supple.  Cardiovascular: Normal rate,  regular rhythm, normal heart sounds and intact distal pulses.  No murmur heard. Pulmonary/Chest: Effort normal and breath sounds normal. She has no wheezes. She has no rales.  Abdominal: Soft. Bowel sounds are normal. There is no tenderness. There is no rebound and no guarding.  Musculoskeletal: Normal range of motion. She exhibits no edema or tenderness.  Lymphadenopathy:    She has no cervical adenopathy.  Neurological: She is alert and oriented to person, place, and time.  Skin: Skin is warm and dry. No rash noted. No erythema.    No results found for this or any previous visit (from the past 72 hour(s)).  Assessment/Plan:  Supervision of other normal pregnancy, antepartum Patient presents today mainly for reassurance. I am reassured that her pregnancy is progressing well due to lack of concerning symptoms such as intense intractable pain and/or vaginal bleeding.  Used to doppler to obtain baby's heart rate and it was found to be 138-141.  She is scheduled to have a 13 week screen. I will follow up with her if needed after she sees high risk OB.     PATIENT EDUCATION PROVIDED: See AVS    Diagnosis and plan along with any newly prescribed medication(s) were discussed in detail with this patient today. The patient verbalized understanding and agreed with the plan. Patient advised if symptoms worsen return to clinic or ER.   Health Maintainance:   No orders of the defined types were placed  in this encounter.   No orders of the defined types were placed in this encounter.    Jules Schick, DO 10/28/2017, 10:14 PM PGY-1, [facility]

## 2017-10-21 NOTE — Patient Instructions (Signed)
It was great to see you again today! Thank you for letting me participate in your care!  Today, we discussed your pregnancy and your ultrasound results. Your ultrasound was overall reassuring but did show a small subchorionic hemorrhage. Due to your history of miscarriage please make sure you are seen by high risk OB service on 10/28/2017. The fact that you had no significant bleeding is reassuring and that we were able to find the baby's heart rate was also a positive sign that the pregnancy is progressing well.  Please return if you begin to have significant pain that does not improve with the usual treatments of rest, Tylenol, heating pads, etc. Or if you experience bleeding.  Please continue to take your prenatal vitamin.  Be well, Marissa Schickim Kemuel Buchmann, DO PGY-1, [facility]

## 2017-10-28 ENCOUNTER — Encounter: Payer: Self-pay | Admitting: Obstetrics and Gynecology

## 2017-10-28 ENCOUNTER — Ambulatory Visit (INDEPENDENT_AMBULATORY_CARE_PROVIDER_SITE_OTHER): Payer: Self-pay | Admitting: Obstetrics and Gynecology

## 2017-10-28 ENCOUNTER — Other Ambulatory Visit: Payer: Self-pay

## 2017-10-28 DIAGNOSIS — Z3480 Encounter for supervision of other normal pregnancy, unspecified trimester: Secondary | ICD-10-CM

## 2017-10-28 LAB — POCT URINALYSIS DIP (DEVICE)
Bilirubin Urine: NEGATIVE
GLUCOSE, UA: NEGATIVE mg/dL
KETONES UR: 15 mg/dL — AB
LEUKOCYTES UA: NEGATIVE
Nitrite: NEGATIVE
Protein, ur: NEGATIVE mg/dL
SPECIFIC GRAVITY, URINE: 1.025 (ref 1.005–1.030)
UROBILINOGEN UA: 0.2 mg/dL (ref 0.0–1.0)
pH: 6 (ref 5.0–8.0)

## 2017-10-28 NOTE — Progress Notes (Signed)
Prenatal Visit Note Date: 10/28/2017 [facility]  [facility].  Subjective:  Marissa Contreras is a 27 y.o. [phone] at 3632w1d being seen today for ongoing prenatal care.  She is currently monitored for the following issues for this low-risk pregnancy and has Back pain; Supervision of other normal pregnancy, antepartum; Mild intermittent asthma; and Migraine on their problem list.  Patient reports no complaints.    . Vag. Bleeding: None.   . Denies leaking of fluid.   The following portions of the patient's history were reviewed and updated as appropriate: allergies, current medications, past family history, past medical history, past social history, past surgical history and problem list. Problem list updated.  Objective:   Vitals:   10/28/17 1402  BP: 118/75  Pulse: 89  Weight: 173 lb 6.4 oz (78.7 kg)    Fetal Status: Fetal Heart Rate (bpm): 158         General:  Alert, oriented and cooperative. Patient is in no acute distress.  Skin: Skin is warm and dry. No rash noted.   Cardiovascular: Normal heart rate noted  Respiratory: Normal respiratory effort, no problems with respiration noted  Abdomen: Soft, gravid, appropriate for gestational age. Pain/Pressure: Present     Pelvic:  Cervical exam deferred        Extremities: Normal range of motion.  Edema: None  Mental Status: Normal mood and affect. Normal behavior. Normal judgment and thought content.   Urinalysis:      Assessment and Plan:  Pregnancy: [id number] at 7232w1d  1. Supervision of other normal pregnancy, antepartum Routine care.  - Enroll patient in Babyscripts Program - TSH - HgB A1c - US MFM Fetal Nuchal Translucency; Future  Preterm labor symptoms and general obstetric precautions including but not limited to vaginal bleeding, contractions, leaking of fluid and fetal movement were reviewed in detail with the patient. Please refer to  After Visit Summary for other counseling recommendations.  RTC 3wks  [place] BingPickens, Robben Jagiello, MD

## 2017-10-28 NOTE — Assessment & Plan Note (Signed)
Patient presents today mainly for reassurance. I am reassured that her pregnancy is progressing well due to lack of concerning symptoms such as intense intractable pain and/or vaginal bleeding.  Used to doppler to obtain baby's heart rate and it was found to be 138-141.  She is scheduled to have a 13 week screen. I will follow up with her if needed after she sees high risk OB.

## 2017-10-29 LAB — HEMOGLOBIN A1C
Est. average glucose Bld gHb Est-mCnc: 91 mg/dL
Hgb A1c MFr Bld: 4.8 % (ref 4.8–5.6)

## 2017-10-29 LAB — TSH: TSH: 0.428 u[IU]/mL — AB (ref 0.450–4.500)

## 2017-11-03 ENCOUNTER — Ambulatory Visit (HOSPITAL_COMMUNITY)
Admission: RE | Admit: 2017-11-03 | Discharge: 2017-11-03 | Disposition: A | Discharge status: Home / Self Care | Payer: Medicaid Other | Source: Ambulatory Visit | Attending: Obstetrics and Gynecology | Admitting: Obstetrics and Gynecology

## 2017-11-03 ENCOUNTER — Other Ambulatory Visit: Payer: Self-pay | Admitting: Obstetrics and Gynecology

## 2017-11-03 ENCOUNTER — Encounter (HOSPITAL_COMMUNITY): Payer: Self-pay

## 2017-11-03 DIAGNOSIS — Z3682 Encounter for antenatal screening for nuchal translucency: Secondary | ICD-10-CM | POA: Diagnosis present

## 2017-11-03 DIAGNOSIS — Z3A13 13 weeks gestation of pregnancy: Secondary | ICD-10-CM | POA: Insufficient documentation

## 2017-11-03 DIAGNOSIS — Z3480 Encounter for supervision of other normal pregnancy, unspecified trimester: Secondary | ICD-10-CM

## 2017-11-06 ENCOUNTER — Other Ambulatory Visit: Payer: Self-pay

## 2017-11-07 ENCOUNTER — Encounter (INDEPENDENT_AMBULATORY_CARE_PROVIDER_SITE_OTHER): Payer: Self-pay

## 2017-11-09 ENCOUNTER — Inpatient Hospital Stay (HOSPITAL_COMMUNITY)
Admission: AD | Admit: 2017-11-09 | Discharge: 2017-11-09 | Disposition: A | Discharge status: Home / Self Care | Payer: Medicaid Other | Source: Ambulatory Visit | Attending: Obstetrics and Gynecology | Admitting: Obstetrics and Gynecology

## 2017-11-09 DIAGNOSIS — Z79899 Other long term (current) drug therapy: Secondary | ICD-10-CM | POA: Insufficient documentation

## 2017-11-09 DIAGNOSIS — F419 Anxiety disorder, unspecified: Secondary | ICD-10-CM | POA: Diagnosis not present

## 2017-11-09 DIAGNOSIS — Z8249 Family history of ischemic heart disease and other diseases of the circulatory system: Secondary | ICD-10-CM | POA: Diagnosis not present

## 2017-11-09 DIAGNOSIS — O210 Mild hyperemesis gravidarum: Secondary | ICD-10-CM | POA: Diagnosis not present

## 2017-11-09 DIAGNOSIS — Z3A13 13 weeks gestation of pregnancy: Secondary | ICD-10-CM | POA: Insufficient documentation

## 2017-11-09 DIAGNOSIS — Z809 Family history of malignant neoplasm, unspecified: Secondary | ICD-10-CM | POA: Diagnosis not present

## 2017-11-09 DIAGNOSIS — Z823 Family history of stroke: Secondary | ICD-10-CM | POA: Insufficient documentation

## 2017-11-09 DIAGNOSIS — R109 Unspecified abdominal pain: Secondary | ICD-10-CM | POA: Insufficient documentation

## 2017-11-09 DIAGNOSIS — Z9889 Other specified postprocedural states: Secondary | ICD-10-CM | POA: Diagnosis not present

## 2017-11-09 DIAGNOSIS — Z87891 Personal history of nicotine dependence: Secondary | ICD-10-CM | POA: Diagnosis not present

## 2017-11-09 DIAGNOSIS — O99511 Diseases of the respiratory system complicating pregnancy, first trimester: Secondary | ICD-10-CM | POA: Insufficient documentation

## 2017-11-09 DIAGNOSIS — Z818 Family history of other mental and behavioral disorders: Secondary | ICD-10-CM | POA: Diagnosis not present

## 2017-11-09 DIAGNOSIS — O99341 Other mental disorders complicating pregnancy, first trimester: Secondary | ICD-10-CM | POA: Insufficient documentation

## 2017-11-09 DIAGNOSIS — Z833 Family history of diabetes mellitus: Secondary | ICD-10-CM | POA: Diagnosis not present

## 2017-11-09 DIAGNOSIS — O26891 Other specified pregnancy related conditions, first trimester: Secondary | ICD-10-CM | POA: Insufficient documentation

## 2017-11-09 DIAGNOSIS — J45909 Unspecified asthma, uncomplicated: Secondary | ICD-10-CM | POA: Diagnosis not present

## 2017-11-09 DIAGNOSIS — G8929 Other chronic pain: Secondary | ICD-10-CM | POA: Diagnosis not present

## 2017-11-09 DIAGNOSIS — Z8261 Family history of arthritis: Secondary | ICD-10-CM | POA: Insufficient documentation

## 2017-11-09 LAB — URINALYSIS, ROUTINE W REFLEX MICROSCOPIC
BILIRUBIN URINE: NEGATIVE
GLUCOSE, UA: NEGATIVE mg/dL
HGB URINE DIPSTICK: NEGATIVE
Ketones, ur: 80 mg/dL — AB
Leukocytes, UA: NEGATIVE
Nitrite: NEGATIVE
Protein, ur: NEGATIVE mg/dL
Specific Gravity, Urine: 1.021 (ref 1.005–1.030)
pH: 5 (ref 5.0–8.0)

## 2017-11-09 MED ORDER — ONDANSETRON 8 MG PO TBDP: 8.0000 mg | ORAL_TABLET | Freq: Three times a day (TID) | ORAL | 0 refills | Status: DC | PRN

## 2017-11-09 MED ORDER — ONDANSETRON 8 MG PO TBDP
8.0000 mg | ORAL_TABLET | Freq: Once | ORAL | Status: AC
  Administered 2017-11-09: 8 mg via ORAL
  Filled 2017-11-09: qty 1

## 2017-11-09 MED ORDER — PROMETHAZINE HCL 25 MG PO TABS: 25.0000 mg | ORAL_TABLET | Freq: Four times a day (QID) | ORAL | 0 refills | Status: DC | PRN

## 2017-11-09 NOTE — MAU Note (Signed)
Pt reports low abd cramping since she found out she was pregnant. Yesterday she vomited and the cramping got worse and has been severe since last night around 1900. Denies any vag bleeding.

## 2017-11-09 NOTE — MAU Provider Note (Signed)
History     CSN: [id number]  Arrival date and time: 11/09/17 1939   First Provider Initiated Contact with Patient 11/09/17 2018     CC: abdominal pain and nausea  HPI  Marissa Contreras 27 y.o. 4484w6d  Comes to MAU with some lower abdominal cramping that she has had for 5 weeks and now has some abdominal pain just above the umbilicus that began yesterday after she had an episode of vomiting.  Goes to the clinic downstairs.  States she is worried that she will have a miscarriage.  Had one pregnancy loss at 15 weeks where there was a FHT at one visit and then not at the next visit.  Today has had nausea and has eaten one can of corn and one hot pocket.  Urine has 80 of ketones.  Does not drink much fluid.  OB History    Gravida Para Term Preterm AB Living   6 2 2   3 2    SAB TAB Ectopic Multiple Live Births   3     0 2      Past Medical History:  Diagnosis Date  . Asthma   . Chronic back pain   . H/O varicella   . Menorrhagia 08/19/2016  . UTI (urinary tract infection) 09/09/2017    Past Surgical History:  Procedure Laterality Date  . DILATION AND EVACUATION N/A 03/08/2014   Procedure: DILATATION AND EVACUATION ;  Surgeon: Purcell NailsAngela Y Roberts, MD;  Location: [facility];  Service: Gynecology;  Laterality: N/A;  with chromosomal studies, sent   . TONSILLECTOMY      Family History  Problem Relation Age of Onset  . Hypertension Father   . Diabetes Father   . Fibromyalgia Mother   . Arthritis Mother   . Depression Mother   . Diabetes Paternal Grandmother   . Cancer Paternal Grandmother   . Stroke Paternal Grandmother     Social History   Tobacco Use  . Smoking status: Former Smoker    Packs/day: 1.00    Types: Cigarettes    Last attempt to quit: 09/24/2017    Years since quitting: 0.1  . Smokeless tobacco: Never Used  . Tobacco comment: quit for pregnancy  Substance Use Topics  . Alcohol use: No  . Drug use: No    Allergies: No Known Allergies  Medications Prior to  Admission  Medication Sig Dispense Refill Last Dose  . acetaminophen (TYLENOL) 500 MG tablet Take 1,000 mg by mouth every 6 (six) hours as needed for mild pain, moderate pain or headache.    Past Week at Unknown time  . citalopram (CELEXA) 20 MG tablet Take 2 tablets (40 mg total) by mouth daily. 180 tablet 3 11/09/2017 at Unknown time  . Prenatal Vit-Fe Fumarate-FA (PRENATAL VITAMIN) 27-0.8 MG TABS Take 1 tablet by mouth daily. 30 tablet 1 Past Month at Unknown time  . cyclobenzaprine (FLEXERIL) 10 MG tablet Take 1 tablet (10 mg total) by mouth 3 (three) times daily as needed for muscle spasms. (Patient not taking: Reported on 10/28/2017) 30 tablet 0 More than a month at Unknown time    Review of Systems  Constitutional: Negative for fever.  Gastrointestinal: Positive for abdominal pain, nausea and vomiting. Negative for diarrhea.  Genitourinary: Negative for dysuria, vaginal bleeding and vaginal discharge.   Physical Exam   Blood pressure 113/63, pulse 89, temperature 98.6 F (37 C), temperature source Oral, resp. rate 16, height 5\' 6"  (1.676 m), weight 175 lb (79.4 kg), last menstrual period  08/04/2017.  Physical Exam  Nursing note and vitals reviewed. Constitutional: She is oriented to person, place, and time. She appears well-developed and well-nourished.  Obvious foul odor in the room suspect it is from her shoes.  HENT:  Head: Normocephalic.  Eyes: EOM are normal.  Neck: Neck supple.  GI: Soft. There is tenderness. There is no rebound and no guarding.  Having pain with palpation just above the umbilicus - abdomen very soft.  No epigastric pain.  Having some lower abdominal cramping.  Fundus palpated at 14 weeks.  FHT heard by RN with doppler.  Musculoskeletal: Normal range of motion.  Neurological: She is alert and oriented to person, place, and time.  Skin: Skin is warm and dry.  Psychiatric: She has a normal mood and affect.    MAU Course  Procedures Results for orders  placed or performed during the hospital encounter of 11/09/17 (from the past 24 hour(s))  Urinalysis, Routine w reflex microscopic     Status: Abnormal   Collection Time: 11/09/17  7:50 PM  Result Value Ref Range   Color, Urine YELLOW YELLOW   APPearance CLEAR CLEAR   Specific Gravity, Urine 1.021 1.005 - 1.030   pH 5.0 5.0 - 8.0   Glucose, UA NEGATIVE NEGATIVE mg/dL   Hgb urine dipstick NEGATIVE NEGATIVE   Bilirubin Urine NEGATIVE NEGATIVE   Ketones, ur 80 (A) NEGATIVE mg/dL   Protein, ur NEGATIVE NEGATIVE mg/dL   Nitrite NEGATIVE NEGATIVE   Leukocytes, UA NEGATIVE NEGATIVE    MDM Client is very worried about a possible miscarriage - she did have one second trimester loss at 15 weeks.  Also has recently stopped her neurontin that she takes for leg pain and now her legs hurt at night again. Pelvic exam not needed tonight.  Has been having the same cramping for 5 weeks and testing done in the clinic was negative.  No history of vaginal bleeding or leaking of fluid or bothersome vaginal discharge.  Assessment and Plan  Abdominal pain in pregnancy - likely is related to mild dehydration, has had the almost constant lower abdominal cramping for 5 weeks and now has unknown periumbilical pain Ketones in urine  - related to not eating enough due to nausea and that likely makes her nausea worse. Morning sickness Anxiety over leg pain and the outside possibility of a miscarriage.  Plan Zofran ODT tonight to help with nausea APhenergan 12.5-25 mg PO as needed for nausea. Drink at least 8 8-oz glasses of water every day. Take Tylenol 325 mg 2 tablets by mouth every 4 hours if needed for pain. Client reports this does not help her. Keep your appointments in the office. Enroll at [facility] for food supplements. Will send message to Asher Muir in the clinic to try to follow this patient.    Terri L Burleson 11/09/2017, 8:42 PM

## 2017-11-09 NOTE — Discharge Instructions (Signed)
Get your medicines for nausea at the pharmacy. Zofran may make you constipated. Phenergan may make you drowsy. Use one or both to help control your symptoms. Drink at least 8 8-oz glasses of water every day. Take Tylenol 325 mg 2 tablets by mouth every 4 hours if needed for pain.  Keep your appointments in the office. [facility].

## 2017-11-13 ENCOUNTER — Encounter: Payer: Self-pay | Admitting: *Deleted

## 2017-11-23 ENCOUNTER — Encounter: Payer: Self-pay | Admitting: Family Medicine

## 2017-11-23 ENCOUNTER — Ambulatory Visit (INDEPENDENT_AMBULATORY_CARE_PROVIDER_SITE_OTHER): Payer: Self-pay | Admitting: Family Medicine

## 2017-11-23 VITALS — BP 117/70 | HR 113 | Wt 177.1 lb

## 2017-11-23 DIAGNOSIS — Z3689 Encounter for other specified antenatal screening: Secondary | ICD-10-CM

## 2017-11-23 DIAGNOSIS — Z3480 Encounter for supervision of other normal pregnancy, unspecified trimester: Secondary | ICD-10-CM

## 2017-11-23 NOTE — Progress Notes (Signed)
   PRENATAL VISIT NOTE  Subjective:  Marissa Contreras is a 27 y.o. [phone] at 3533w6d being seen today for ongoing prenatal care.  She is currently monitored for the following issues for this low-risk pregnancy and has Back pain; Supervision of other normal pregnancy, antepartum; Mild intermittent asthma; and Migraine on their problem list.  Patient reports no complaints - N/v improved with phenergan.  Contractions: Not present. Vag. Bleeding: None.   . Denies leaking of fluid.   The following portions of the patient's history were reviewed and updated as appropriate: allergies, current medications, past family history, past medical history, past social history, past surgical history and problem list. Problem list updated.  Objective:   Vitals:   11/23/17 1320  BP: 117/70  Pulse: (!) 113  Weight: 177 lb 1.6 oz (80.3 kg)    Fetal Status: Fetal Heart Rate (bpm): 153         General:  Alert, oriented and cooperative. Patient is in no acute distress.  Skin: Skin is warm and dry. No rash noted.   Cardiovascular: Normal heart rate noted  Respiratory: Normal respiratory effort, no problems with respiration noted  Abdomen: Soft, gravid, appropriate for gestational age.  Pain/Pressure: Absent     Pelvic: Cervical exam deferred        Extremities: Normal range of motion.  Edema: None  Mental Status:  Normal mood and affect. Normal behavior. Normal judgment and thought content.   Assessment and Plan:  Pregnancy: [id number] at 3833w6d  1. Supervision of other normal pregnancy, antepartum FHT normal. FH normal  2. Screening, antenatal, for fetal anatomic survey - US MFM OB COMP + 14 WK; Future   Preterm labor symptoms and general obstetric precautions including but not limited to vaginal bleeding, contractions, leaking of fluid and fetal movement were reviewed in detail with the patient. Please refer to After Visit Summary for other counseling recommendations.  No Follow-up on file.   Levie HeritageJacob J  Stinson, DO

## 2017-11-24 ENCOUNTER — Encounter: Payer: Self-pay | Admitting: Family Medicine

## 2017-11-27 ENCOUNTER — Telehealth: Payer: Self-pay | Admitting: *Deleted

## 2017-11-27 DIAGNOSIS — O219 Vomiting of pregnancy, unspecified: Secondary | ICD-10-CM

## 2017-11-27 MED ORDER — PROMETHAZINE HCL 25 MG PO TABS: 25.0000 mg | ORAL_TABLET | Freq: Four times a day (QID) | ORAL | 0 refills | Status: DC | PRN

## 2017-11-27 NOTE — Telephone Encounter (Signed)
[facility], request refill of promethazine. Refill sent.

## 2017-12-14 ENCOUNTER — Encounter: Payer: Self-pay | Admitting: Obstetrics and Gynecology

## 2017-12-14 ENCOUNTER — Other Ambulatory Visit: Payer: Self-pay | Admitting: Family Medicine

## 2017-12-14 ENCOUNTER — Ambulatory Visit (HOSPITAL_COMMUNITY)
Admission: RE | Admit: 2017-12-14 | Discharge: 2017-12-14 | Disposition: A | Discharge status: Home / Self Care | Payer: Medicaid Other | Source: Ambulatory Visit | Attending: Family Medicine | Admitting: Family Medicine

## 2017-12-14 DIAGNOSIS — Z3A18 18 weeks gestation of pregnancy: Secondary | ICD-10-CM

## 2017-12-14 DIAGNOSIS — O358XX0 Maternal care for other (suspected) fetal abnormality and damage, not applicable or unspecified: Secondary | ICD-10-CM | POA: Diagnosis not present

## 2017-12-14 DIAGNOSIS — Z3689 Encounter for other specified antenatal screening: Secondary | ICD-10-CM | POA: Insufficient documentation

## 2017-12-14 DIAGNOSIS — G930 Cerebral cysts: Secondary | ICD-10-CM | POA: Insufficient documentation

## 2017-12-14 DIAGNOSIS — Z362 Encounter for other antenatal screening follow-up: Secondary | ICD-10-CM | POA: Insufficient documentation

## 2017-12-21 ENCOUNTER — Ambulatory Visit (INDEPENDENT_AMBULATORY_CARE_PROVIDER_SITE_OTHER): Payer: Medicaid Other | Admitting: Advanced Practice Midwife

## 2017-12-21 VITALS — BP 120/68 | HR 106 | Wt 183.0 lb

## 2017-12-21 DIAGNOSIS — Z3482 Encounter for supervision of other normal pregnancy, second trimester: Secondary | ICD-10-CM

## 2017-12-21 DIAGNOSIS — Z3480 Encounter for supervision of other normal pregnancy, unspecified trimester: Secondary | ICD-10-CM

## 2017-12-21 MED ORDER — POLYETHYLENE GLYCOL 3350 17 G PO PACK: 17.0000 g | PACK | Freq: Every day | ORAL | Status: DC

## 2017-12-21 MED ORDER — ELASTIC BANDAGES & SUPPORTS MISC: 1.0000 [IU] | 0 refills | Status: DC | PRN

## 2017-12-21 NOTE — Patient Instructions (Addendum)
Safe Medications in Pregnancy   Acne: Benzoyl Peroxide Salicylic Acid  Backache/Headache: Tylenol: 2 regular strength every 4 hours OR              2 Extra strength every 6 hours  Colds/Coughs/Allergies: Benadryl (alcohol free) 25 mg every 6 hours as needed Breath right strips Claritin Cepacol throat lozenges Chloraseptic throat spray Cold-Eeze- up to three times per day Cough drops, alcohol free Flonase (by prescription only) Guaifenesin Mucinex Robitussin DM (plain only, alcohol free) Saline nasal spray/drops Sudafed (pseudoephedrine) & Actifed ** use only after [redacted] weeks gestation and if you do not have high blood pressure Tylenol Vicks Vaporub Zinc lozenges Zyrtec   Constipation: Colace Ducolax suppositories Fleet enema Glycerin suppositories Metamucil Milk of magnesia Miralax Senokot Smooth move tea  Diarrhea: Kaopectate Imodium A-D  *NO pepto Bismol  Hemorrhoids: Anusol Anusol HC Preparation H Tucks  Indigestion: Tums Maalox Mylanta Zantac  Pepcid  Insomnia: Benadryl (alcohol free) 25mg  every 6 hours as needed Tylenol PM Unisom, no Gelcaps  Leg Cramps: Tums MagGel  Nausea/Vomiting:  Bonine Dramamine Emetrol Ginger extract Sea bands Meclizine  Nausea medication to take during pregnancy:  Unisom (doxylamine succinate 25 mg tablets) Take one tablet daily at bedtime. If symptoms are not adequately controlled, the dose can be increased to a maximum recommended dose of two tablets daily (1/2 tablet in the morning, 1/2 tablet mid-afternoon and one at bedtime). Vitamin B6 100mg  tablets. Take one tablet twice a day (up to 200 mg per day).  Skin Rashes: Aveeno products Benadryl cream or 25mg  every 6 hours as needed Calamine Lotion 1% cortisone cream  Yeast infection: Gyne-lotrimin 7 Monistat 7   **If taking multiple medications, please check labels to avoid duplicating the same active ingredients **take medication as directed on  the label ** Do not exceed 4000 mg of tylenol in 24 hours **Do not take medications that contain aspirin or ibuprofen  Places to have your son circumcised:    [facility] [phone] $480 while you are in hospital  [facility] [phone] $244 by 4 wks  [facility] [phone] $175 by 2 wks  [facility] [phone] $250 by 7 days [facility] [phone] $269 by 4 wks  These prices sometimes change but are roughly what you can expect to pay. Please call and confirm pricing.   Circumcision is considered an elective/non-medically necessary procedure. There are many reasons parents decide to have their sons circumsized. During the first year of life circumcised males have a reduced risk of urinary tract infections but after this year the rates between circumcised males and uncircumcised males are the same.  It is safe to have your son circumcised outside of the hospital and the places above perform them regularly.   Deciding about Circumcision in Baby Boys  (Up-to-date The Basics)  What is circumcision?  Circumcision is a surgery that removes the skin that covers the tip of the penis, called the "foreskin" Circumcision is usually done when a boy is between 401 and 3410 days old. In the [place], circumcision is common. In some other countries, fewer boys are circumcised. Circumcision is a common tradition in some religions.  Should I have my baby boy circumcised?  There is no easy answer. Circumcision has some benefits. But it also has risks. After talking with your doctor, you will have to decide for yourself what is right for your family.  What are the benefits of circumcision?  Circumcised boys seem to have slightly lower rates of: ?Urinary tract infections ?Swelling of  the opening at the tip of the penis Circumcised men seem  to have slightly lower rates of: ?Urinary tract infections ?Swelling of the opening at the tip of the penis ?Penis cancer ?HIV and other infections that you catch during sex ?Cervical cancer in the women they have sex with Even so, in the [place], the risks of these problems are small - even in boys and men who have not been circumcised. Plus, boys and men who are not circumcised can reduce these extra risks by: ?Cleaning their penis well ?Using condoms during sex  What are the risks of circumcision?  Risks include: ?Bleeding or infection from the surgery ?Damage to or amputation of the penis ?A chance that the doctor will cut off too much or not enough of the foreskin ?A chance that sex won't feel as good later in life Only about 1 out of every 200 circumcisions leads to problems. There is also a chance that your health insurance won't pay for circumcision.  How is circumcision done in baby boys?  First, the baby gets medicine for pain relief. This might be a cream on the skin or a shot into the base of the penis. Next, the doctor cleans the baby's penis well. Then he or she uses special tools to cut off the foreskin. Finally, the doctor wraps a bandage (called gauze) around the baby's penis. If you have your baby circumcised, his doctor or nurse will give you instructions on how to care for him after the surgery. It is important that you follow those instructions carefully.

## 2017-12-21 NOTE — Progress Notes (Signed)
Pt states having pain in back & hips wrapping around stomach & Tylenol does not help. Also Colace is not helping her constipation.

## 2017-12-21 NOTE — Progress Notes (Signed)
   PRENATAL VISIT NOTE  Subjective:  Marissa Contreras is a 27 y.o. [phone] at 7363w6d being seen today for ongoing prenatal care.  She is currently monitored for the following issues for this low-risk pregnancy and has Back pain; Supervision of other normal pregnancy, antepartum; Mild intermittent asthma; and Migraine on their problem list.  Patient reports backache and constipation. Contractions: Not present. Vag. Bleeding: None.  Movement: Present. Denies leaking of fluid.   The following portions of the patient's history were reviewed and updated as appropriate: allergies, current medications, past family history, past medical history, past social history, past surgical history and problem list. Problem list updated.  Objective:   Vitals:   12/21/17 1346  BP: 120/68  Pulse: (!) 106  Weight: 183 lb (83 kg)    Fetal Status: Fetal Heart Rate (bpm): 148 Fundal Height: 19 cm Movement: Present     General:  Alert, oriented and cooperative. Patient is in no acute distress.  Skin: Skin is warm and dry. No rash noted.   Cardiovascular: Normal heart rate noted  Respiratory: Normal respiratory effort, no problems with respiration noted  Abdomen: Soft, gravid, appropriate for gestational age.  Pain/Pressure: Present     Pelvic: Cervical exam deferred        Extremities: Normal range of motion.  Edema: Trace  Mental Status: Normal mood and affect. Normal behavior. Normal judgment and thought content.   Assessment and Plan:  Pregnancy: [id number] at 9863w6d  1. Supervision of other normal pregnancy, antepartum - US MFM OB FOLLOW UP; Future - AFP, Serum, Open Spina Bifida - Colace not helping constipation. Sent in Miralax  - Maternity support belt.   Preterm labor symptoms and general obstetric precautions including but not limited to vaginal bleeding, contractions, leaking of fluid and fetal movement were reviewed in detail with the patient. Please refer to After Visit Summary for other counseling  recommendations.  Return in about 1 month (around 01/18/2018).  Future Appointments  Date Time Provider Department Center  01/18/2018  1:45 PM [facility] US 2 [facility]    Thressa ShellerHeather Dwana Garin, CNM

## 2017-12-23 LAB — AFP, SERUM, OPEN SPINA BIFIDA
AFP MoM: 1.52
AFP VALUE AFPOSL: 69.8 ng/mL
Gest. Age on Collection Date: 19.6 weeks
Maternal Age At EDD: 24.8 yr
OSBR Risk 1 IN: 2591
Test Results:: NEGATIVE
WEIGHT: 183 [lb_av]

## 2018-01-18 ENCOUNTER — Ambulatory Visit (HOSPITAL_COMMUNITY)
Admission: RE | Admit: 2018-01-18 | Discharge: 2018-01-18 | Disposition: A | Discharge status: Home / Self Care | Payer: Medicaid Other | Source: Ambulatory Visit | Attending: Advanced Practice Midwife | Admitting: Advanced Practice Midwife

## 2018-01-18 ENCOUNTER — Other Ambulatory Visit: Payer: Self-pay

## 2018-01-18 DIAGNOSIS — Z362 Encounter for other antenatal screening follow-up: Secondary | ICD-10-CM | POA: Insufficient documentation

## 2018-01-18 DIAGNOSIS — Z3A23 23 weeks gestation of pregnancy: Secondary | ICD-10-CM | POA: Insufficient documentation

## 2018-01-18 DIAGNOSIS — Z3480 Encounter for supervision of other normal pregnancy, unspecified trimester: Secondary | ICD-10-CM

## 2018-01-18 DIAGNOSIS — J45909 Unspecified asthma, uncomplicated: Secondary | ICD-10-CM

## 2018-01-18 MED ORDER — ALBUTEROL SULFATE HFA 108 (90 BASE) MCG/ACT IN AERS: 2.0000 | INHALATION_SPRAY | RESPIRATORY_TRACT | 2 refills | Status: DC | PRN

## 2018-01-18 NOTE — Progress Notes (Signed)
Pt called the office requesting a prescription for an inhaler for asthma. Medication was sent to pt's pharmacy.

## 2018-01-19 ENCOUNTER — Encounter: Payer: Self-pay | Admitting: Advanced Practice Midwife

## 2018-01-19 ENCOUNTER — Ambulatory Visit (INDEPENDENT_AMBULATORY_CARE_PROVIDER_SITE_OTHER): Payer: Medicaid Other | Admitting: Advanced Practice Midwife

## 2018-01-19 DIAGNOSIS — Z3482 Encounter for supervision of other normal pregnancy, second trimester: Secondary | ICD-10-CM

## 2018-01-19 DIAGNOSIS — Z3480 Encounter for supervision of other normal pregnancy, unspecified trimester: Secondary | ICD-10-CM

## 2018-01-19 MED ORDER — ELASTIC BANDAGES & SUPPORTS MISC
1.0000 [IU] | 0 refills | Status: DC | PRN
Start: 2018-01-19 — End: 2018-04-29

## 2018-01-19 MED ORDER — CYCLOBENZAPRINE HCL 10 MG PO TABS: 10.0000 mg | ORAL_TABLET | Freq: Three times a day (TID) | ORAL | 1 refills | Status: DC | PRN

## 2018-01-19 NOTE — Progress Notes (Signed)
   PRENATAL VISIT NOTE  Subjective:  Marissa Contreras is a 27 y.o. [phone] at [redacted]w[redacted]d being seen today for ongoing prenatal care.  She is currently monitored for the following issues for this low-risk pregnancy and has Back pain; Supervision of other normal pregnancy, antepartum; Mild intermittent asthma; and Migraine on their problem list.  Patient reports backache.  Contractions: Not present. Vag. Bleeding: None.  Movement: Present. Denies leaking of fluid.  Patient having back pain and some nerve pain associated with this. She was on gabapentin prior to the pregnancy, but stopped this medication. She has tried PT in the past, but that has not helped. She has rx for support belt, but has not been able to get this. She needs a new rx to take to a medical supply store. She would also like to try the chiropractor.   The following portions of the patient's history were reviewed and updated as appropriate: allergies, current medications, past family history, past medical history, past social history, past surgical history and problem list. Problem list updated.  Objective:   Vitals:   01/19/18 1409  BP: 129/67  Pulse: 97  Weight: 185 lb 9.6 oz (84.2 kg)    Fetal Status: Fetal Heart Rate (bpm): 141 Fundal Height: 25 cm Movement: Present     General:  Alert, oriented and cooperative. Patient is in no acute distress.  Skin: Skin is warm and dry. No rash noted.   Cardiovascular: Normal heart rate noted  Respiratory: Normal respiratory effort, no problems with respiration noted  Abdomen: Soft, gravid, appropriate for gestational age.  Pain/Pressure: Present     Pelvic: Cervical exam deferred        Extremities: Normal range of motion.  Edema: None  Mental Status: Normal mood and affect. Normal behavior. Normal judgment and thought content.   Assessment and Plan:  Pregnancy: [id number] at [redacted]w[redacted]d  1. Supervision of other normal pregnancy, antepartum - Flexeril PRN - New RX for maternity support belt  given - Info on chiropractor given - If no improvement we can send referral to PT - [facility]TT at [place]   Preterm labor symptoms and general obstetric precautions including but not limited to vaginal bleeding, contractions, leaking of fluid and fetal movement were reviewed in detail with the patient. Please refer to After Visit Summary for other counseling recommendations.  Return in about 1 month (around 02/16/2018).  No future appointments.  Thressa Sheller, CNM

## 2018-01-19 NOTE — Patient Instructions (Addendum)
Gestational diabetes mellitus (GDM) is high blood glucose (high blood sugar) that develops during pregnancy (ADA, 2018). With routine prenatal care provided in the [place] most people drink "Glucola" as part of a screening test before diagnosing gestational diabetes. In other parts of the world, care providers may give mothers a screening test and/or a diagnostic test for GDM using other types of glucose drinks. Diagnosing gestational diabetes is a complex topic with lots of controversy. Even though we have a lot of research on diagnosing gestational diabetes, professionals around the world still disagree on the best way to screen for and diagnose this condition. This article will describe gestational diabetes, explain the reasons for the disagreement over diagnosing gestational diabetes, and discuss the potential risks linked to the condition, as well as the potential benefits from treatment. What is gestational diabetes? To understand gestational diabetes, it's helpful to first learn how the body metabolizes sugar. After you eat or drink carbohydrates (often called "carbs"), your gastrointestinal system helps the carbohydrates enter your bloodstream as glucose (often called "sugar"), which your body must turn into energy. Insulin is a hormone produced by the pancreas that helps deliver glucose from the blood into your body's cells, where the glucose can be turned into energy that fuels your body's functions. Insulin also helps convert extra glucose into fat for storage. All pregnant people experience some metabolic changes during pregnancy. In normal pregnancy, hormones from the placenta make it harder for your body to use insulin-you may require up to three times as much insulin to overcome the increased insulin resistance (ADA, 2016). Insulin resistance means that your cells are resistant to insulin-it's kind of like if a neighbor (i.e. insulin) keeps knocking on your door (i.e. cell) with  gifts of food, and over time, has to knock louder and louder to get you to open the door! In a pregnancy that is not complicated by gestational diabetes, it's harder for insulin to 'open the door,' but the body's insulin response, or ability to produce more insulin, is enough to overcome the resistance. However, with gestational diabetes, there is too much insulin resistance, too little insulin response (called low beta cell function), or a combination of both (Powe et al. 2016). Some women with GDM have more of a problem with insulin resistance, while others with GDM have more of a problem with low beta cell function (Personal correspondence, Dr. Barbour, 2018). Going back to our analogy, low beta cell function is like if the neighbor who is knocking gets tired over time and knocks more softly. So, with GDM, the door doesn't open because of your high reluctance to answer it (insulin resistance), the neighbor's low intensity in knocking (low beta cell function), or a combination of both factors. You can imagine that with either scenario, the neighbor gives up and takes the food somewhere else. In a similar way, when this happens with GDM, glucose builds up in the blood until it reaches abnormally high levels, called hyperglycemia. The routine tests that are done in pregnancy to identify GDM do not directly measure insulin resistance or beta cell function. Instead, the tests measure blood sugar levels, because it is high blood sugar that can cause problems for mother and baby. If you have GDM, treatment with diet, exercise, and sometimes medicine, is necessary to maintain healthy blood sugar levels. Researchers think insulin resistance exists to help move more nutrients to the baby (instead of the mother) to promote healthy fetal growth and development (Farrar et al. 2017a). The   mother's body is making sure that the baby gets enough nutrition from sugar in the blood, even if food becomes scarce for the mother.  This adaptation helped us in the past, but most people today have too much food available-including too many processed foods with simple, easily digested sugars. This situation has led to more people putting on extra body weight, which tends to increase insulin resistance and decrease beta cell function, which further increases the risk of high blood sugar. The one-part diagnostic method  Outside of the [place] most countries promote some variation of the one-part diagnostic method (universal or selective), although in [place] they have endorsed a different version of the two-part screening and diagnostic method (Table 5). According to IADPSG criteria, the one-part diagnostic test is a 75-gram, 2-hour OGTT, which requires fasting before the test. This OGTT measures blood sugar after fasting and again at one and two hours after the test. Gestational diabetes is diagnosed with one or more high blood sugar values. Method: . Drink a 75-gram diagnostic OGTT, with blood sugar measured after fasting (?8 hours) and at 1 and 2-hours after the test.  . The diagnosis of GDM is made when any of the following blood sugar values are met or exceeded:  . Fasting: 92 mg/dL  . 1-hour: 180 mg/dL  . 2-hour: 153 mg/dL Unlike the two-part screening and diagnostic method, the one-part diagnostic method test cutoffs were developed based specifically on pregnancy and birth outcomes instead of the mother's future risk of diabetes (IADPSG, 2010; ADA, 2018). As we already mentioned, adopting the IADPSG criteria would greatly increase the rate of people diagnosed with GDM (NIH, 2013). Using the one-part diagnostic method, more people would potentially benefit from treatment for high blood sugar. However, there are also downsides (which is why there is no agreement on which method is best). Using the 75-gram test with IADPSG criteria, everyone has to fast before the test, which may be difficult for some people. Also, an increase in the  number of people diagnosed with GDM comes with an increase in personal and health care costs. Mothers diagnosed with GDM face more medical appointments (to meet with a registered dietitian, a diabetes educator, or both) and they are told to carefully watch what they eat and monitor blood sugar levels several times a day (NIH, 2013). Testing supplies, blood sugar medication (if needed), and extra monitoring all come with significant costs, which are not always fully covered by insurance in the [place] A diagnosis of GDM can be stressful for some mothers, and care providers may pressure women to schedule an induction simply because they have been diagnosed as having GDM. The bottom line . We have strong evidence that treating GDM improves birth outcomes for mothers and babies. . Gestational diabetes begins during pregnancy, but some people enter pregnancy with pre-existing diabetes (type 2 diabetes) that was previously undiagnosed. To detect pre-existing diabetes, care providers may offer screening in early pregnancy to mothers with risk factors for type 2 diabetes. . There is widespread agreement that screening or testing for GDM should take place between 24 and 28 weeks of pregnancy. However, researchers and organizations disagree about the best way to screen and diagnose GDM: . Some countries and professional organizations (such as ACOG in the [place] prefer a two-part method that includes a screening test (frequently the "Glucola" drink), and if that is positive, women take a diagnostic test (which involves fasting, drinking a glucose beverage, and having multiple blood tests). . However, most other countries   and organizations prefer a one-part method where everybody (or at least everybody with risk factors for GDM) receives the one-part diagnostic test. . With the two-part screening and diagnostic method used in the [place] cutoffs for GDM diagnosis vary by hospital. When you get your results, it may be helpful  to obtain the actual numbers, rather than a statement that you "passed" or "failed" the glucose test. Compare your test results with the Carpenter-Coustan or National Diabetes Data Group Criteria to get a better feel for where your results fall. . Although many people contact us about alternatives to drinking the standard glucose solution, the evidence on alternatives is very limited at this time: . We don't know if getting a sugar load from candy, juice, or food screens for GDM as well as the standard glucose drink. . Researchers have suggested that a fasting plasma glucose test in the third trimester may be useful as a screening test when it is used with upper and lower cutoffs to 'rule-in' or 'rule-out' GDM. However, more research is needed. . People who would rather not drink the standard glucose beverage, or who can't due to vomiting or other reasons, could discuss alternative methods with their provider. However, we do not have sufficient evidence on alternatives at this time to state which alternative is best, or how accurate these alternatives may be. Receiving a diagnosis of GDM can be stressful for many people. However, the benefits of a positive test result are that you can uncover the potential for health problems before they become a real problem, and take action to improve your health and birth outcomes.   

## 2018-01-26 ENCOUNTER — Encounter: Payer: Self-pay | Admitting: Family Medicine

## 2018-01-26 ENCOUNTER — Other Ambulatory Visit: Payer: Self-pay

## 2018-01-26 ENCOUNTER — Ambulatory Visit (INDEPENDENT_AMBULATORY_CARE_PROVIDER_SITE_OTHER): Payer: Medicaid Other | Admitting: Family Medicine

## 2018-01-26 VITALS — BP 118/80 | HR 92 | Temp 98.3°F | Ht 66.0 in | Wt 186.6 lb

## 2018-01-26 DIAGNOSIS — G8929 Other chronic pain: Secondary | ICD-10-CM | POA: Diagnosis not present

## 2018-01-26 DIAGNOSIS — M546 Pain in thoracic spine: Secondary | ICD-10-CM | POA: Diagnosis not present

## 2018-01-26 MED ORDER — LIDOCAINE 5 % EX PTCH: 1.0000 | MEDICATED_PATCH | CUTANEOUS | 0 refills | Status: DC

## 2018-01-26 NOTE — Progress Notes (Signed)
Subjective: Chief Complaint  Patient presents with  . Back Pain  . Leg Pain     HPI: Marissa Contreras is a 27 y.o. presenting to clinic today to discuss the following:  Mid/Low Back Pain during pregnancy Patient states she has been having sharp pain in her mid to low back for the past 2 months. It radiates to her fingers and down her legs and is described as achy and dull. Her pain is constant and is interfering with her sleep; she states she is only getting 2 hours of sleep per night. She has tried heating pad, pregnancy belt, stretches, tylenol, and flexeril but nothing is helping.   She endorses nausea.  She denies fever, chills, night sweats, chest pain, SOB, vomiting, diarrhea, and urinary burning or pain.  Health Maintenance: None    ROS noted in HPI.   Past Medical, Surgical, Social, and Family History Reviewed & Updated per EMR.   Pertinent Historical Findings include:  Social History   Tobacco Use  Smoking Status Former Smoker  . Packs/day: 1.00  . Types: Cigarettes  . Last attempt to quit: 09/24/2017  . Years since quitting: 0.3  Smokeless Tobacco Never Used  Tobacco Comment   quit for pregnancy      Objective: BP 118/80   Pulse 92   Temp 98.3 F (36.8 C) (Oral)   Ht  (1.676 m)   Wt 186 lb 9.6 oz (84.6 kg)   LMP 08/04/2017 (Approximate)   SpO2 99%   BMI 30.12 kg/m  Vitals and nursing notes reviewed  Physical Exam  Constitutional: She is oriented to person, place, and time. She appears well-developed and well-nourished. No distress.  HENT:  Head: Normocephalic and atraumatic.  Eyes: Pupils are equal, round, and reactive to light. EOM are normal.  Neck: Normal range of motion.  Cardiovascular: Normal rate, regular rhythm, normal heart sounds and intact distal pulses.  No murmur heard. Pulmonary/Chest: Effort normal and breath sounds normal. She has no wheezes. She has no rales.  Abdominal: Soft. Bowel sounds are normal.  Abdomen size is  consistent with gravida  Musculoskeletal: Normal range of motion. She exhibits tenderness.  Neurological: She is alert and oriented to person, place, and time.  Skin: Skin is warm and dry. Capillary refill takes less than 2 seconds. No rash noted. No erythema.   No results found for this or any previous visit (from the past 72 hour(s)).  Assessment/Plan:  Back pain Most likely her back pain is exacerbated by pregnancy. Cont current therapy and stretches recommended to her by her OBGYN.  I have prescribed her Lidoderm Patch, Cat B in pregnancy as needed.   I have also instructed patient to buy Capsacin cream OTC and use as needed BID.   Would recommend against use of opioids.  PATIENT EDUCATION PROVIDED: See AVS    Diagnosis and plan along with any newly prescribed medication(s) were discussed in detail with this patient today. The patient verbalized understanding and agreed with the plan. Patient advised if symptoms worsen return to clinic or ER.   Health Maintainance:   No orders of the defined types were placed in this encounter.   Meds ordered this encounter  Medications  . lidocaine (LIDODERM) 5 %    Sig: Place 1 patch onto the skin daily. Remove & Discard patch within 12 hours or as directed by MD    Dispense:  30 patch    Refill:  0     Jules Schick,  DO 01/26/2018, 12:04 PM PGY-1, [facility]

## 2018-01-26 NOTE — Patient Instructions (Addendum)
It was great to see you today! Thank you for letting me participate in your care!  Today, we discussed your low back pain and generalized pain in your legs and arms. It is most likely due to the changes your body is undergoing due to pregnancy.   I have written you a prescription for a lidoderm patch. It has not been studied in pregnancy so please use only if needed.  Please purchase the roll on Capsicin cream over the counter and use as directed.    Be well, Jules Schick, DO PGY-1, [name]

## 2018-01-28 ENCOUNTER — Telehealth: Payer: Self-pay

## 2018-01-28 NOTE — Assessment & Plan Note (Signed)
Most likely her back pain is exacerbated by pregnancy. Cont current therapy and stretches recommended to her by her OBGYN.  I have prescribed her Lidoderm Patch, Cat B in pregnancy as needed.   I have also instructed patient to buy Capsacin cream OTC and use as needed BID.   Would recommend against use of opioids.

## 2018-01-28 NOTE — Telephone Encounter (Signed)
Received fax from [facility] pharmacy requesting prior authorization of Lidocaine patch. Form placed in PCP's box for completion along with Medicaid formulary and criteria. Ples Specter, RN [facility] RN)

## 2018-01-28 NOTE — Telephone Encounter (Signed)
Prior approval for lidocaine patch completed via [employer].  Med approved for 01/29/48 - 07/27/18 Prior approval # [id number] [facility] pharmacy informed.  Shawna Orleans, RN

## 2018-02-04 ENCOUNTER — Other Ambulatory Visit: Payer: Self-pay | Admitting: Nurse Practitioner

## 2018-02-04 DIAGNOSIS — O219 Vomiting of pregnancy, unspecified: Secondary | ICD-10-CM

## 2018-02-04 NOTE — Telephone Encounter (Signed)
This is a [facility] patient

## 2018-02-08 MED ORDER — ONDANSETRON 8 MG PO TBDP: 8.0000 mg | ORAL_TABLET | Freq: Three times a day (TID) | ORAL | 0 refills | Status: DC | PRN

## 2018-02-17 ENCOUNTER — Ambulatory Visit (INDEPENDENT_AMBULATORY_CARE_PROVIDER_SITE_OTHER): Payer: Medicaid Other | Admitting: Medical

## 2018-02-17 ENCOUNTER — Other Ambulatory Visit: Payer: Medicaid Other

## 2018-02-17 ENCOUNTER — Encounter: Payer: Self-pay | Admitting: Medical

## 2018-02-17 VITALS — BP 114/70 | HR 109 | Wt 186.5 lb

## 2018-02-17 DIAGNOSIS — Z23 Encounter for immunization: Secondary | ICD-10-CM

## 2018-02-17 DIAGNOSIS — Z3480 Encounter for supervision of other normal pregnancy, unspecified trimester: Secondary | ICD-10-CM

## 2018-02-17 NOTE — Patient Instructions (Signed)

## 2018-02-17 NOTE — Progress Notes (Signed)
   PRENATAL VISIT NOTE  Subjective:  Marissa Contreras is a 27 y.o. [phone] at [redacted]w[redacted]d being seen today for ongoing prenatal care.  She is currently monitored for the following issues for this low-risk pregnancy and has Back pain; Supervision of other normal pregnancy, antepartum; Mild intermittent asthma; and Migraine on their problem list.  Patient reports no complaints.  Contractions: Irritability. Vag. Bleeding: None.  Movement: Present. Denies leaking of fluid.   The following portions of the patient's history were reviewed and updated as appropriate: allergies, current medications, past family history, past medical history, past social history, past surgical history and problem list. Problem list updated.  Objective:   Vitals:   02/17/18 1009  BP: 114/70  Pulse: (!) 109  Weight: 186 lb 8 oz (84.6 kg)    Fetal Status: Fetal Heart Rate (bpm): 138 Fundal Height: 29 cm Movement: Present     General:  Alert, oriented and cooperative. Patient is in no acute distress.  Skin: Skin is warm and dry. No rash noted.   Cardiovascular: Normal heart rate noted  Respiratory: Normal respiratory effort, no problems with respiration noted  Abdomen: Soft, gravid, appropriate for gestational age.  Pain/Pressure: Present     Pelvic: Cervical exam deferred        Extremities: Normal range of motion.  Edema: Trace  Mental Status: Normal mood and affect. Normal behavior. Normal judgment and thought content.   Assessment and Plan:  Pregnancy: [id number] at [redacted]w[redacted]d  1. Supervision of other normal pregnancy, antepartum - Doing well - TDap today  - GTT, CBC, HIV and RPR today  Preterm labor symptoms and general obstetric precautions including but not limited to vaginal bleeding, contractions, leaking of fluid and fetal movement were reviewed in detail with the patient. Please refer to After Visit Summary for other counseling recommendations.  Return in about 2 weeks (around 03/03/2018) for LOB.  Vonzella Nipple,  PA-C

## 2018-02-18 ENCOUNTER — Encounter (HOSPITAL_COMMUNITY): Payer: Self-pay

## 2018-02-18 ENCOUNTER — Inpatient Hospital Stay (HOSPITAL_COMMUNITY)
Admission: AD | Admit: 2018-02-18 | Discharge: 2018-02-18 | Disposition: A | Discharge status: Home / Self Care | Payer: Medicaid Other | Source: Ambulatory Visit | Attending: Obstetrics and Gynecology | Admitting: Obstetrics and Gynecology

## 2018-02-18 DIAGNOSIS — R109 Unspecified abdominal pain: Secondary | ICD-10-CM | POA: Diagnosis present

## 2018-02-18 DIAGNOSIS — R102 Pelvic and perineal pain: Secondary | ICD-10-CM | POA: Insufficient documentation

## 2018-02-18 DIAGNOSIS — O26899 Other specified pregnancy related conditions, unspecified trimester: Secondary | ICD-10-CM | POA: Diagnosis not present

## 2018-02-18 DIAGNOSIS — Z3A28 28 weeks gestation of pregnancy: Secondary | ICD-10-CM | POA: Diagnosis not present

## 2018-02-18 DIAGNOSIS — O4703 False labor before 37 completed weeks of gestation, third trimester: Secondary | ICD-10-CM | POA: Insufficient documentation

## 2018-02-18 DIAGNOSIS — O26893 Other specified pregnancy related conditions, third trimester: Secondary | ICD-10-CM | POA: Diagnosis not present

## 2018-02-18 LAB — URINALYSIS, ROUTINE W REFLEX MICROSCOPIC
BILIRUBIN URINE: NEGATIVE
Glucose, UA: NEGATIVE mg/dL
Hgb urine dipstick: NEGATIVE
Ketones, ur: 20 mg/dL — AB
LEUKOCYTES UA: NEGATIVE
Nitrite: NEGATIVE
PH: 5 (ref 5.0–8.0)
PROTEIN: 30 mg/dL — AB
Specific Gravity, Urine: 1.027 (ref 1.005–1.030)

## 2018-02-18 LAB — CBC
HEMOGLOBIN: 10.7 g/dL — AB (ref 11.1–15.9)
Hematocrit: 33.2 % — ABNORMAL LOW (ref 34.0–46.6)
MCH: 28.2 pg (ref 26.6–33.0)
MCHC: 32.2 g/dL (ref 31.5–35.7)
MCV: 87 fL (ref 79–97)
PLATELETS: 250 10*3/uL (ref 150–450)
RBC: 3.8 x10E6/uL (ref 3.77–5.28)
RDW: 13.7 % (ref 12.3–15.4)
WBC: 7 10*3/uL (ref 3.4–10.8)

## 2018-02-18 LAB — GLUCOSE TOLERANCE, 2 HOURS W/ 1HR
GLUCOSE, 1 HOUR: 109 mg/dL (ref 65–179)
GLUCOSE, FASTING: 79 mg/dL (ref 65–91)
Glucose, 2 hour: 100 mg/dL (ref 65–152)

## 2018-02-18 LAB — HIV ANTIBODY (ROUTINE TESTING W REFLEX): HIV Screen 4th Generation wRfx: NONREACTIVE

## 2018-02-18 LAB — RPR: RPR: NONREACTIVE

## 2018-02-18 LAB — FETAL FIBRONECTIN: Fetal Fibronectin: NEGATIVE

## 2018-02-18 MED ORDER — NIFEDIPINE 10 MG PO CAPS
10.0000 mg | ORAL_CAPSULE | ORAL | Status: AC | PRN
  Administered 2018-02-18 (×4): 10 mg via ORAL
  Filled 2018-02-18 (×4): qty 1

## 2018-02-18 NOTE — Discharge Instructions (Signed)

## 2018-02-18 NOTE — MAU Provider Note (Signed)
CC:  Chief Complaint  Patient presents with  . Contractions     First Provider Initiated Contact with Patient 02/18/18 1955       HPI: Marissa Contreras is a 27 y.o. year old [id number] female at [redacted]w[redacted]d weeks gestation who presents to MAU reporting contractions every 10 minutes since 0930.  Also reports sharp pain in groin is worse with movement.  Associated Sx:  Vaginal bleeding: Denies Leaking of fluid: Denies Fetal movement: Normal  O:  Patient Vitals for the past 24 hrs:  BP Temp Temp src Pulse Resp SpO2 Height Weight  02/18/18 2117 - - - (!) 106 - - - -  02/18/18 2116 115/61 - - - - - - -  02/18/18 2052 122/64 - - - - - - -  02/18/18 2031 118/73 - - - - - - -  02/18/18 2011 121/80 - - - - - - -  02/18/18 1957 - - - (!) 113 - - - -  02/18/18 1910 123/81 98.6 F (37 C) Oral (!) 132 20 100 %  (1.676 m) 188 lb (85.3 kg)    General: NAD Heart: Regular rate Lungs: Normal rate and effort Abd: Soft, NT, Gravid, S=D Pelvic: NEFG, negative pooling, negative blood.  Dilation: Closed(1 on outside os) Effacement (%): Thick Exam by:: Dorathy Kinsman, CNM  EFM: 150, Moderate variability, 15 x 15 accelerations, no decelerations Toco: Uterine irritability  Results for orders placed or performed during the hospital encounter of 02/18/18 (from the past 24 hour(s))  Urinalysis, Routine w reflex microscopic     Status: Abnormal   Collection Time: 02/18/18  7:07 PM  Result Value Ref Range   Color, Urine AMBER (A) YELLOW   APPearance CLEAR CLEAR   Specific Gravity, Urine 1.027 1.005 - 1.030   pH 5.0 5.0 - 8.0   Glucose, UA NEGATIVE NEGATIVE mg/dL   Hgb urine dipstick NEGATIVE NEGATIVE   Bilirubin Urine NEGATIVE NEGATIVE   Ketones, ur 20 (A) NEGATIVE mg/dL   Protein, ur 30 (A) NEGATIVE mg/dL   Nitrite NEGATIVE NEGATIVE   Leukocytes, UA NEGATIVE NEGATIVE   RBC / HPF 0-5 0 - 5 RBC/hpf   WBC, UA 0-5 0 - 5 WBC/hpf   Bacteria, UA RARE (A) NONE SEEN   Squamous Epithelial / LPF 0-5 0 - 5    Mucus PRESENT   Fetal fibronectin     Status: None   Collection Time: 02/18/18  8:07 PM  Result Value Ref Range   Fetal Fibronectin NEGATIVE NEGATIVE     Orders Placed This Encounter  Procedures  . Urinalysis, Routine w reflex microscopic  . Fetal fibronectin  . Encourage fluids  . Discharge patient   Meds ordered this encounter  Medications  . NIFEdipine (PROCARDIA) capsule 10 mg   - Preterm contractions resolved with Procardia.  No cervical change.  Fetal fibronectin.  No evidence of active preterm labor.  A: [redacted]w[redacted]d week IUP 1. Preterm uterine contractions in third trimester, antepartum   2. Pain of round ligament affecting pregnancy, antepartum   FHR reactive  P: Discharge home in stable condition  Push fluids. Comfort measures. Preterm labor precautions and fetal kick counts. Follow-up as scheduled for prenatal visit or sooner as needed if symptoms worsen. Return to maternity admissions as needed if symptoms worsen.  Katrinka Blazing, [place], CNM 02/18/2018 10:08 PM  3

## 2018-02-18 NOTE — MAU Note (Signed)
Pt reports braxton hicks contractions since 9:30am. States throughout the day they have gotten much worse. States she feels them every few minutes. States she has been drinking plenty of water. Pt denies LOF or vaginal bleeding. Reports good fetal movement.

## 2018-03-01 ENCOUNTER — Ambulatory Visit (INDEPENDENT_AMBULATORY_CARE_PROVIDER_SITE_OTHER): Payer: Medicaid Other | Admitting: Advanced Practice Midwife

## 2018-03-01 VITALS — BP 120/70 | HR 105 | Wt 186.0 lb

## 2018-03-01 DIAGNOSIS — Z3483 Encounter for supervision of other normal pregnancy, third trimester: Secondary | ICD-10-CM

## 2018-03-01 NOTE — Progress Notes (Signed)
    PRENATAL VISIT NOTE  Subjective:  Marissa Contreras is a 27 y.o. [phone] at 3031w6d being seen today for ongoing prenatal care.  She is currently monitored for the following issues for this low-risk pregnancy and has Back pain; Supervision of other normal pregnancy, antepartum; Mild intermittent asthma; and Migraine on their problem list.  Patient reports fatigue, no bleeding, no contractions, no cramping and no leaking.  Contractions: Irregular. Vag. Bleeding: None.  Movement: Present. Denies leaking of fluid.   Patient states she is concerned about the shape of her fingernails and believes she is iron deficient. Has begin eating iron fortified cereal. Declined CBC today.  The following portions of the patient's history were reviewed and updated as appropriate: allergies, current medications, past family history, past medical history, past social history, past surgical history and problem list. Problem list updated.  Objective:   Vitals:   03/01/18 1540  BP: 120/70  Pulse: (!) 105  Weight: 186 lb (84.4 kg)    Fetal Status: Fetal Heart Rate (bpm): 153 Fundal Height: 30 cm Movement: Present     General:  Alert, oriented and cooperative. Patient is in no acute distress.  Skin: Skin is warm and dry. No rash noted.   Cardiovascular: Normal heart rate noted  Respiratory: Normal respiratory effort, no problems with respiration noted  Abdomen: Soft, gravid, appropriate for gestational age.  Pain/Pressure: Present     Pelvic: Cervical exam deferred        Extremities: Normal range of motion.  Edema: None  Mental Status: Normal mood and affect. Normal behavior. Normal judgment and thought content.   Assessment and Plan:  Pregnancy: [id number] at 4331w6d  1. Encounter for supervision of other normal pregnancy in third trimester --Discussed fatigue as normal component of third trimester. --Reviewed signs/symptoms of anemia, pt declined CBC --Patient desires BTL but cannot remember how name is  written on Medicaid card. Sign consent next visit.  Preterm labor symptoms and general obstetric precautions including but not limited to vaginal bleeding, contractions, leaking of fluid and fetal movement were reviewed in detail with the patient. Please refer to After Visit Summary for other counseling recommendations.  No follow-ups on file.  Future Appointments  Date Time Provider Department Center  03/15/2018  2:15 PM Armando ReichertHogan, Heather D, CNM [facility]    Calvert CantorSamantha C Delio Slates, [place]  03/01/18  4:03 PM

## 2018-03-01 NOTE — Patient Instructions (Signed)

## 2018-03-15 ENCOUNTER — Encounter: Payer: Self-pay | Admitting: Advanced Practice Midwife

## 2018-03-15 ENCOUNTER — Ambulatory Visit (INDEPENDENT_AMBULATORY_CARE_PROVIDER_SITE_OTHER): Payer: Medicaid Other | Admitting: Advanced Practice Midwife

## 2018-03-15 VITALS — BP 110/72 | HR 108 | Wt 186.0 lb

## 2018-03-15 DIAGNOSIS — Z3480 Encounter for supervision of other normal pregnancy, unspecified trimester: Secondary | ICD-10-CM

## 2018-03-15 DIAGNOSIS — Z3483 Encounter for supervision of other normal pregnancy, third trimester: Secondary | ICD-10-CM

## 2018-03-15 NOTE — Patient Instructions (Signed)

## 2018-03-15 NOTE — Progress Notes (Signed)
Feels high screening numbers due to back pain and nerve pain she is having. Denies needing to see Asher MuirJamie

## 2018-03-15 NOTE — Progress Notes (Signed)
   PRENATAL VISIT NOTE  Subjective:  Marissa Contreras is a 27 y.o. [phone] at 6328w6d being seen today for ongoing prenatal care.  She is currently monitored for the following issues for this low-risk pregnancy and has Back pain; Supervision of other normal pregnancy, antepartum; Mild intermittent asthma; and Migraine on their problem list.  Patient reports no complaints.  Contractions: Irregular. Vag. Bleeding: None.  Movement: Present. Denies leaking of fluid.   The following portions of the patient's history were reviewed and updated as appropriate: allergies, current medications, past family history, past medical history, past social history, past surgical history and problem list. Problem list updated.  Objective:   Vitals:   03/15/18 1425  BP: 110/72  Pulse: (!) 108  Weight: 186 lb (84.4 kg)    Fetal Status: Fetal Heart Rate (bpm): 137 Fundal Height: 32 cm Movement: Present     General:  Alert, oriented and cooperative. Patient is in no acute distress.  Skin: Skin is warm and dry. No rash noted.   Cardiovascular: Normal heart rate noted  Respiratory: Normal respiratory effort, no problems with respiration noted  Abdomen: Soft, gravid, appropriate for gestational age.  Pain/Pressure: Present     Pelvic: Cervical exam deferred        Extremities: Normal range of motion.  Edema: None  Mental Status: Normal mood and affect. Normal behavior. Normal judgment and thought content.   Assessment and Plan:  Pregnancy: [id number] at 2728w6d  1. Supervision of Normal pregnancy - BTL papers today   Preterm labor symptoms and general obstetric precautions including but not limited to vaginal bleeding, contractions, leaking of fluid and fetal movement were reviewed in detail with the patient. Please refer to After Visit Summary for other counseling recommendations.  Return in about 2 weeks (around 03/29/2018).  No future appointments.  Thressa ShellerHeather Hogan, CNM

## 2018-03-21 ENCOUNTER — Encounter (HOSPITAL_COMMUNITY): Payer: Self-pay | Admitting: *Deleted

## 2018-03-21 ENCOUNTER — Inpatient Hospital Stay (HOSPITAL_COMMUNITY)
Admission: AD | Admit: 2018-03-21 | Discharge: 2018-03-21 | Disposition: A | Discharge status: Home / Self Care | Payer: Medicaid Other | Source: Ambulatory Visit | Attending: Obstetrics and Gynecology | Admitting: Obstetrics and Gynecology

## 2018-03-21 DIAGNOSIS — J45909 Unspecified asthma, uncomplicated: Secondary | ICD-10-CM | POA: Diagnosis not present

## 2018-03-21 DIAGNOSIS — M549 Dorsalgia, unspecified: Secondary | ICD-10-CM | POA: Diagnosis not present

## 2018-03-21 DIAGNOSIS — N898 Other specified noninflammatory disorders of vagina: Secondary | ICD-10-CM | POA: Insufficient documentation

## 2018-03-21 DIAGNOSIS — O479 False labor, unspecified: Secondary | ICD-10-CM | POA: Diagnosis not present

## 2018-03-21 DIAGNOSIS — Z87891 Personal history of nicotine dependence: Secondary | ICD-10-CM | POA: Insufficient documentation

## 2018-03-21 DIAGNOSIS — R102 Pelvic and perineal pain: Secondary | ICD-10-CM | POA: Insufficient documentation

## 2018-03-21 DIAGNOSIS — G8929 Other chronic pain: Secondary | ICD-10-CM | POA: Diagnosis not present

## 2018-03-21 DIAGNOSIS — O99513 Diseases of the respiratory system complicating pregnancy, third trimester: Secondary | ICD-10-CM | POA: Insufficient documentation

## 2018-03-21 DIAGNOSIS — N949 Unspecified condition associated with female genital organs and menstrual cycle: Secondary | ICD-10-CM

## 2018-03-21 DIAGNOSIS — Z8249 Family history of ischemic heart disease and other diseases of the circulatory system: Secondary | ICD-10-CM | POA: Insufficient documentation

## 2018-03-21 DIAGNOSIS — Z79899 Other long term (current) drug therapy: Secondary | ICD-10-CM | POA: Diagnosis not present

## 2018-03-21 DIAGNOSIS — O99353 Diseases of the nervous system complicating pregnancy, third trimester: Secondary | ICD-10-CM | POA: Insufficient documentation

## 2018-03-21 DIAGNOSIS — O4703 False labor before 37 completed weeks of gestation, third trimester: Secondary | ICD-10-CM

## 2018-03-21 DIAGNOSIS — O26893 Other specified pregnancy related conditions, third trimester: Secondary | ICD-10-CM | POA: Diagnosis not present

## 2018-03-21 DIAGNOSIS — R109 Unspecified abdominal pain: Secondary | ICD-10-CM

## 2018-03-21 DIAGNOSIS — Z3A32 32 weeks gestation of pregnancy: Secondary | ICD-10-CM | POA: Diagnosis not present

## 2018-03-21 LAB — URINALYSIS, ROUTINE W REFLEX MICROSCOPIC
BILIRUBIN URINE: NEGATIVE
GLUCOSE, UA: NEGATIVE mg/dL
HGB URINE DIPSTICK: NEGATIVE
Ketones, ur: NEGATIVE mg/dL
LEUKOCYTES UA: NEGATIVE
NITRITE: NEGATIVE
Protein, ur: NEGATIVE mg/dL
SPECIFIC GRAVITY, URINE: 1.002 — AB (ref 1.005–1.030)
pH: 7 (ref 5.0–8.0)

## 2018-03-21 LAB — WET PREP, GENITAL
Clue Cells Wet Prep HPF POC: NONE SEEN
SPERM: NONE SEEN
Trich, Wet Prep: NONE SEEN
YEAST WET PREP: NONE SEEN

## 2018-03-21 LAB — POCT FERN TEST: POCT Fern Test: NEGATIVE

## 2018-03-21 NOTE — MAU Note (Signed)
Urine in lab 

## 2018-03-21 NOTE — Discharge Instructions (Signed)

## 2018-03-21 NOTE — MAU Note (Signed)
Marissa Contreras is a 27 y.o. at 7378w5d here in MAU reporting:  +contractions +pelvic pain/pressure "lightening crotch" patient reports Started yesterday  +vaginal discharge Watery No odor x1 week  Vitals:   03/21/18 1844  BP: 121/66  Pulse: (!) 115  Resp: 17  Temp: 97.8 F (36.6 C)  SpO2: 100%   Pain score: 3-4/10. Tried tylenol with no relief  FHT:163 Lab orders placed from triage: ua

## 2018-03-21 NOTE — MAU Provider Note (Addendum)
Chief Complaint:  Contractions   First Provider Initiated Contact with Patient 03/21/18 1903      HPI: Marissa Contreras is a 27 y.o. [id number] at 4625w5d who presents to MAU reporting pelvic pressure and LLQ abdominal pain. Patient states that symptoms started after she took a "very hot: bath. Patient states she feels like she has "lightening crouch." She has a h/o chronic back and nerve pain. Pain is worse when she moves. No h/o PTD. Has been having irregular preterm contractions since the beginning of her third trimester. Endorsing some clear watery discharge for about a week.   Denies vaginal bleeding. Good fetal movement.   Pregnancy Course:  Prenatal care at [facility] Low-risk pregnancy  Past Medical History: Past Medical History:  Diagnosis Date  . Asthma   . Chronic back pain   . H/O varicella   . Menorrhagia 08/19/2016  . UTI (urinary tract infection) 09/09/2017    Past obstetric history: OB History  Gravida Para Term Preterm AB Living  7 2 2   4 2   SAB TAB Ectopic Multiple Live Births  4     0 2    # Outcome Date GA Lbr Len/2nd Weight Sex Delivery Anes PTL Lv  7 Current           6 Term 04/13/15 4057w1d 05:12 / 00:16 3.895 kg (8 lb 9.4 oz) F Vag-Spont EPI  LIV  5 SAB 2015 342w0d         4 Term 01/11/13    M Vag-Spont EPI N LIV  3 SAB 2012 565w0d         2 SAB 2011 8954w0d         1 SAB             Past Surgical History: Past Surgical History:  Procedure Laterality Date  . DILATION AND EVACUATION N/A 03/08/2014   Procedure: DILATATION AND EVACUATION ;  Surgeon: Purcell NailsAngela Y Roberts, MD;  Location: [facility];  Service: Gynecology;  Laterality: N/A;  with chromosomal studies, sent   . TONSILLECTOMY       Family History: Family History  Problem Relation Age of Onset  . Hypertension Father   . Diabetes Father   . Fibromyalgia Mother   . Arthritis Mother   . Depression Mother   . Diabetes Paternal Grandmother   . Cancer Paternal Grandmother   . Stroke Paternal Grandmother      Social History: Social History   Tobacco Use  . Smoking status: Former Smoker    Packs/day: 1.00    Types: Cigarettes    Last attempt to quit: 09/24/2017    Years since quitting: 0.4  . Smokeless tobacco: Never Used  . Tobacco comment: quit for pregnancy  Substance Use Topics  . Alcohol use: No  . Drug use: No    Allergies: No Known Allergies  Meds:  Facility-Administered Medications Prior to Admission  Medication Dose Route Frequency Provider Last Rate Last Dose  . polyethylene glycol (MIRALAX / GLYCOLAX) packet 17 g  17 g Oral Daily Thressa ShellerHogan, Heather D, CNM       Medications Prior to Admission  Medication Sig Dispense Refill Last Dose  . acetaminophen (TYLENOL) 500 MG tablet Take 1,000 mg by mouth every 6 (six) hours as needed for mild pain, moderate pain or headache.    Taking  . albuterol (PROVENTIL HFA;[name] 108 (90 Base) MCG/ACT inhaler Inhale 2 puffs into the lungs every 4 (four) hours as needed for wheezing or shortness of breath.  1 Inhaler 2 Taking  . citalopram (CELEXA) 20 MG tablet Take 2 tablets (40 mg total) by mouth daily. 180 tablet 3 Taking  . cyclobenzaprine (FLEXERIL) 10 MG tablet Take 1 tablet (10 mg total) by mouth every 8 (eight) hours as needed for muscle spasms. 30 tablet 1 Taking  . docusate sodium (COLACE) 100 MG capsule Take 100 mg by mouth 2 (two) times daily.   Not Taking  . Elastic Bandages & Supports MISC 1 Units by Does not apply route as needed. 1 each 0 Taking  . lidocaine (LIDODERM) 5 % Place 1 patch onto the skin daily. Remove & Discard patch within 12 hours or as directed by MD 30 patch 0 Taking  . ondansetron (ZOFRAN ODT) 8 MG disintegrating tablet Take 1 tablet (8 mg total) by mouth every 8 (eight) hours as needed for nausea or vomiting. Place under tongue to dissolve. 30 tablet 0 Taking  . Prenatal Vit-Fe Fumarate-FA (PRENATAL VITAMIN) 27-0.8 MG TABS Take 1 tablet by mouth daily. 30 tablet 1 Taking  . promethazine (PHENERGAN) 25 MG  tablet TAKE 1 TABLET BY MOUTH EVERY 6 HOURS AS NEEDED FOR NAUSEA OR VOMITING. MAY TAKE 1/2 TABLET FOR MILD SYMPTOMS 30 tablet 0 Taking    I have reviewed patient's Past Medical Hx, Surgical Hx, Family Hx, Social Hx, medications and allergies.   ROS:  All systems reviewed and are negative for acute change except as noted in the HPI.   Physical Exam   Patient Vitals for the past 24 hrs:  BP Temp Temp src Pulse Resp SpO2 Height Weight  03/21/18 1844 121/66 97.8 F (36.6 C) Oral (!) 115 17 100 % - -  03/21/18 1839 - - - - - - 5\' 6"  (1.676 m) 84.8 kg (187 lb)   Constitutional: Well-developed, well-nourished female in no acute distress.  Cardiovascular: normal rate and rhythm, pulses intact Respiratory: normal rate and effort.  GI: Abd soft, non-tender, gravid appropriate for gestational age.  MS: Extremities nontender, no edema, normal ROM Neurologic: Alert and oriented x 4.  GU: Neg CVAT. Pelvic: NEFG, physiologic discharge, no blood, cervix clean. No CMT Psych: normal mood and affect  Dilation: 2 Effacement (%): Thick Station: -3 Presentation: Vertex Exam by:: Michaela Corner, D.O.     Labs: Results for orders placed or performed during the hospital encounter of 03/21/18 (from the past 24 hour(s))  Urinalysis, Routine w reflex microscopic     Status: Abnormal   Collection Time: 03/21/18  6:43 PM  Result Value Ref Range   Color, Urine STRAW (A) YELLOW   APPearance CLEAR CLEAR   Specific Gravity, Urine 1.002 (L) 1.005 - 1.030   pH 7.0 5.0 - 8.0   Glucose, UA NEGATIVE NEGATIVE mg/dL   Hgb urine dipstick NEGATIVE NEGATIVE   Bilirubin Urine NEGATIVE NEGATIVE   Ketones, ur NEGATIVE NEGATIVE mg/dL   Protein, ur NEGATIVE NEGATIVE mg/dL   Nitrite NEGATIVE NEGATIVE   Leukocytes, UA NEGATIVE NEGATIVE  Wet prep, genital     Status: Abnormal   Collection Time: 03/21/18  7:23 PM  Result Value Ref Range   Yeast Wet Prep HPF POC NONE SEEN NONE SEEN   Trich, Wet Prep NONE SEEN NONE SEEN    Clue Cells Wet Prep HPF POC NONE SEEN NONE SEEN   WBC, Wet Prep HPF POC MANY (A) NONE SEEN   Sperm NONE SEEN   Fern Test     Status: Normal   Collection Time: 03/21/18  8:16 PM  Result Value  Ref Range   POCT Fern Test Negative = intact amniotic membranes     Imaging:  No results found.  MAU Management/MDM: Vitals and nursing notes reviewed  No signs of PTD. Fern negative. Wet prep and UA also negative. Reassurance given. Chronic pain and round ligament pain.   Orders Placed This Encounter  Procedures  . Wet prep, genital  . Urinalysis, Routine w reflex microscopic  . Fern Test  . Discharge patient Discharge disposition: 01-Home or Self Care; Discharge patient date: 03/21/2018    No orders of the defined types were placed in this encounter.   Plan of care reviewed with patient, including labs and tests ordered and medical treatment.  Consult None.  Treatments in MAU included None.   I personally reviewed the patient's NST today, found to be REACTIVE. 140 bpm, mod var, +accels, no decels. CTX: UI.  ASSESSMENT 1. Abdominal pain during pregnancy in third trimester   2. Vaginal discharge during pregnancy in third trimester   3. Braxton Hick's contraction   4. Round ligament pain     PLAN Discharge home in stable condition. Conservative measures discussed Patient encouraged to use her support band, flexeril, and lidocaine patch as needed for her pain Counseled on return precautions Handout given    Allergies as of 03/21/2018   No Known Allergies     Medication List    TAKE these medications   acetaminophen 500 MG tablet Commonly known as:  TYLENOL Take 1,000 mg by mouth every 6 (six) hours as needed for mild pain, moderate pain or headache.   albuterol 108 (90 Base) MCG/ACT inhaler Commonly known as:  PROVENTIL HFA;VENTOLIN HFA Inhale 2 puffs into the lungs every 4 (four) hours as needed for wheezing or shortness of breath.   citalopram 20 MG  tablet Commonly known as:  CELEXA Take 2 tablets (40 mg total) by mouth daily.   cyclobenzaprine 10 MG tablet Commonly known as:  FLEXERIL Take 1 tablet (10 mg total) by mouth every 8 (eight) hours as needed for muscle spasms.   docusate sodium 100 MG capsule Commonly known as:  COLACE Take 100 mg by mouth 2 (two) times daily.   Elastic Bandages & Supports Misc 1 Units by Does not apply route as needed.   lidocaine 5 % Commonly known as:  LIDODERM Place 1 patch onto the skin daily. Remove & Discard patch within 12 hours or as directed by MD   ondansetron 8 MG disintegrating tablet Commonly known as:  ZOFRAN ODT Take 1 tablet (8 mg total) by mouth every 8 (eight) hours as needed for nausea or vomiting. Place under tongue to dissolve.   Prenatal Vitamin 27-0.8 MG Tabs Take 1 tablet by mouth daily.   promethazine 25 MG tablet Commonly known as:  PHENERGAN TAKE 1 TABLET BY MOUTH EVERY 6 HOURS AS NEEDED FOR NAUSEA OR VOMITING. MAY TAKE 1/2 TABLET FOR MILD SYMPTOMS        Caryl Ada, DO OB Fellow [facility] 03/21/2018, 7:28 PM

## 2018-03-22 LAB — GC/CHLAMYDIA PROBE AMP (~~LOC~~) NOT AT ARMC
CHLAMYDIA, DNA PROBE: NEGATIVE
Neisseria Gonorrhea: NEGATIVE

## 2018-03-29 ENCOUNTER — Other Ambulatory Visit: Payer: Self-pay | Admitting: Family Medicine

## 2018-03-29 ENCOUNTER — Other Ambulatory Visit: Payer: Self-pay | Admitting: Medical

## 2018-03-29 ENCOUNTER — Ambulatory Visit (INDEPENDENT_AMBULATORY_CARE_PROVIDER_SITE_OTHER): Payer: Medicaid Other | Admitting: Advanced Practice Midwife

## 2018-03-29 VITALS — BP 117/62 | HR 89 | Wt 186.1 lb

## 2018-03-29 DIAGNOSIS — O9989 Other specified diseases and conditions complicating pregnancy, childbirth and the puerperium: Secondary | ICD-10-CM

## 2018-03-29 DIAGNOSIS — O99891 Other specified diseases and conditions complicating pregnancy: Secondary | ICD-10-CM

## 2018-03-29 DIAGNOSIS — M549 Dorsalgia, unspecified: Secondary | ICD-10-CM

## 2018-03-29 DIAGNOSIS — Z3483 Encounter for supervision of other normal pregnancy, third trimester: Secondary | ICD-10-CM

## 2018-03-29 DIAGNOSIS — O219 Vomiting of pregnancy, unspecified: Secondary | ICD-10-CM

## 2018-03-29 NOTE — Progress Notes (Signed)
Pt states pain is worse than before also causing her to get sick but nothing is helping the pain nor nausea.

## 2018-03-29 NOTE — Progress Notes (Signed)
   PRENATAL VISIT NOTE  Subjective:  Franky Machooni Reever is a 27 y.o. [phone] at 7363w6d being seen today for ongoing prenatal care.  She is currently monitored for the following issues for this low-risk pregnancy and has Back pain; Supervision of other normal pregnancy, antepartum; Mild intermittent asthma; and Migraine on their problem list.  Patient reports no complaints.  Contractions: Irregular. Vag. Bleeding: None.  Movement: Present. Denies leaking of fluid.   The following portions of the patient's history were reviewed and updated as appropriate: allergies, current medications, past family history, past medical history, past social history, past surgical history and problem list. Problem list updated.  Objective:   Vitals:   03/29/18 1641  BP: 117/62  Pulse: 89  Weight: 186 lb 1.6 oz (84.4 kg)    Fetal Status: Fetal Heart Rate (bpm): 122 Fundal Height: 33 cm Movement: Present     General:  Alert, oriented and cooperative. Patient is in no acute distress.  Skin: Skin is warm and dry. No rash noted.   Cardiovascular: Normal heart rate noted  Respiratory: Normal respiratory effort, no problems with respiration noted  Abdomen: Soft, gravid, appropriate for gestational age.  Pain/Pressure: Present     Pelvic: Cervical exam performed Dilation: 1.5 Effacement (%): Thick Station: -3  Extremities: Normal range of motion.  Edema: None  Mental Status: Normal mood and affect. Normal behavior. Normal judgment and thought content.   Assessment and Plan:  Pregnancy: [id number] at 363w6d  1. Encounter for supervision of other normal pregnancy in third trimester - Routine care - GBS at [facility]  - Referral to PT for back pain.  - Patient had MRI in 2013 for these same sx. Has been chronic. She was on tramadol and percocet prior to the pregnancy, but has stopped these medications.  - We could consider a repeat MRI as this was done so long ago. Patient asking for pain medication today. Advised this isn't  recommended with negative MRI and during pregnancy. She is tearful, and is asking for IOL today. Patient advised that IOL can't be done until a min of 39 weeks.   Preterm labor symptoms and general obstetric precautions including but not limited to vaginal bleeding, contractions, leaking of fluid and fetal movement were reviewed in detail with the patient. Please refer to After Visit Summary for other counseling recommendations.  Return in about 2 weeks (around 04/12/2018).  Future Appointments  Date Time Provider Department Center  04/12/2018 10:35 AM Armando ReichertHogan, Heather D, CNM [facility]    Thressa ShellerHeather Hogan, CNM

## 2018-03-29 NOTE — Patient Instructions (Signed)

## 2018-03-30 ENCOUNTER — Telehealth: Payer: Self-pay

## 2018-03-30 ENCOUNTER — Other Ambulatory Visit: Payer: Self-pay

## 2018-03-30 ENCOUNTER — Other Ambulatory Visit: Payer: Self-pay | Admitting: Nurse Practitioner

## 2018-03-30 ENCOUNTER — Other Ambulatory Visit: Payer: Self-pay | Admitting: Family Medicine

## 2018-03-30 ENCOUNTER — Inpatient Hospital Stay (HOSPITAL_COMMUNITY)
Admission: AD | Admit: 2018-03-30 | Discharge: 2018-03-30 | Disposition: A | Discharge status: Home / Self Care | Payer: Medicaid Other | Source: Ambulatory Visit | Attending: Family Medicine | Admitting: Family Medicine

## 2018-03-30 ENCOUNTER — Encounter (HOSPITAL_COMMUNITY): Payer: Self-pay

## 2018-03-30 DIAGNOSIS — O26893 Other specified pregnancy related conditions, third trimester: Secondary | ICD-10-CM | POA: Insufficient documentation

## 2018-03-30 DIAGNOSIS — Z3A34 34 weeks gestation of pregnancy: Secondary | ICD-10-CM | POA: Diagnosis not present

## 2018-03-30 DIAGNOSIS — G8929 Other chronic pain: Secondary | ICD-10-CM

## 2018-03-30 DIAGNOSIS — Z87891 Personal history of nicotine dependence: Secondary | ICD-10-CM | POA: Insufficient documentation

## 2018-03-30 DIAGNOSIS — M5441 Lumbago with sciatica, right side: Secondary | ICD-10-CM

## 2018-03-30 DIAGNOSIS — M549 Dorsalgia, unspecified: Secondary | ICD-10-CM | POA: Insufficient documentation

## 2018-03-30 LAB — URINALYSIS, ROUTINE W REFLEX MICROSCOPIC
Bilirubin Urine: NEGATIVE
Glucose, UA: NEGATIVE mg/dL
HGB URINE DIPSTICK: NEGATIVE
KETONES UR: NEGATIVE mg/dL
Leukocytes, UA: NEGATIVE
NITRITE: NEGATIVE
Protein, ur: NEGATIVE mg/dL
Specific Gravity, Urine: 1.018 (ref 1.005–1.030)
pH: 7 (ref 5.0–8.0)

## 2018-03-30 MED ORDER — TRAMADOL HCL 50 MG PO TABS: 50.0000 mg | ORAL_TABLET | Freq: Four times a day (QID) | ORAL | 0 refills | Status: DC | PRN

## 2018-03-30 NOTE — MAU Provider Note (Signed)
History     CSN: [id number]  Arrival date and time: 03/30/18 1742   None     Chief Complaint  Patient presents with  . Abdominal Pain  . Decreased Fetal Movement  . Back Pain   HPI 27 yo [id number] with pregnancy complicated by chronic back pain that has been worse over the past 10 days. She was seen in MAU on 6/30 and in the office yesterday. She was treated with gabapentin, tramadol and percocet prior to pregnancy.  Patient has fallen 3 times in the past 2 months, at which time her pain restarted.  Pain is located in her lower back, worse on the right side.  Pain is becoming worse.  Has tried Tylenol, Flexeril, heat, ice, lidocaine, capsaicin cream.  Pain has not improved with any of these.  Movement provokes pain.  OB History    Gravida  7   Para  2   Term  2   Preterm      AB  4   Living  2     SAB  4   TAB      Ectopic      Multiple  0   Live Births  2           Past Medical History:  Diagnosis Date  . Asthma   . Chronic back pain   . H/O varicella   . Menorrhagia 08/19/2016  . UTI (urinary tract infection) 09/09/2017    Past Surgical History:  Procedure Laterality Date  . DILATION AND EVACUATION N/A 03/08/2014   Procedure: DILATATION AND EVACUATION ;  Surgeon: Purcell NailsAngela Y Roberts, MD;  Location: [facility];  Service: Gynecology;  Laterality: N/A;  with chromosomal studies, sent   . TONSILLECTOMY      Family History  Problem Relation Age of Onset  . Hypertension Father   . Diabetes Father   . Fibromyalgia Mother   . Arthritis Mother   . Depression Mother   . Diabetes Paternal Grandmother   . Cancer Paternal Grandmother   . Stroke Paternal Grandmother     Social History   Tobacco Use  . Smoking status: Former Smoker    Packs/day: 1.00    Types: Cigarettes    Last attempt to quit: 09/24/2017    Years since quitting: 0.5  . Smokeless tobacco: Never Used  . Tobacco comment: quit for pregnancy  Substance Use Topics  . Alcohol use: No  . Drug  use: No    Allergies: No Known Allergies  Facility-Administered Medications Prior to Admission  Medication Dose Route Frequency Provider Last Rate Last Dose  . polyethylene glycol (MIRALAX / GLYCOLAX) packet 17 g  17 g Oral Daily Thressa ShellerHogan, Heather D, CNM       Medications Prior to Admission  Medication Sig Dispense Refill Last Dose  . acetaminophen (TYLENOL) 500 MG tablet Take 1,000 mg by mouth every 6 (six) hours as needed for mild pain, moderate pain or headache.    Taking  . albuterol (PROVENTIL HFA;[name] 108 (90 Base) MCG/ACT inhaler Inhale 2 puffs into the lungs every 4 (four) hours as needed for wheezing or shortness of breath. 1 Inhaler 2 Taking  . citalopram (CELEXA) 20 MG tablet Take 2 tablets (40 mg total) by mouth daily. 180 tablet 3 Taking  . cyclobenzaprine (FLEXERIL) 10 MG tablet Take 1 tablet (10 mg total) by mouth every 8 (eight) hours as needed for muscle spasms. 30 tablet 1 Taking  . docusate sodium (COLACE) 100 MG capsule  Take 100 mg by mouth 2 (two) times daily.   Taking  . Elastic Bandages & Supports MISC 1 Units by Does not apply route as needed. 1 each 0 Taking  . lidocaine (LIDODERM) 5 % PLACE 1 PATCH ONTO THE SKIN DAILY. REMOVE AND DISCARD PATCH WITHIN 12HOURSOR AS DIRECTED BY MD 30 patch 1   . ondansetron (ZOFRAN ODT) 8 MG disintegrating tablet Take 1 tablet (8 mg total) by mouth every 8 (eight) hours as needed for nausea or vomiting. Place under tongue to dissolve. 30 tablet 0 Taking  . Prenatal Vit-Fe Fumarate-FA (PRENATAL VITAMIN) 27-0.8 MG TABS Take 1 tablet by mouth daily. 30 tablet 1 Taking  . promethazine (PHENERGAN) 25 MG tablet TAKE 1 TABLET BY MOUTH EVERY 6 HOURS AS NEEDED FOR NAUSEA OR VOMITING. MAY TAKE 1/2 TABLET FOR MILD SYMPTOMS. 30 tablet 0     Review of Systems Physical Exam   Blood pressure 113/65, pulse (!) 107, temperature 98.3 F (36.8 C), temperature source Oral, resp. rate 18, height 5\' 6"  (1.676 m), weight 187 lb (84.8 kg), last  menstrual period 08/04/2017.  Physical Exam  Constitutional: She is oriented to person, place, and time. She appears well-developed and well-nourished.  Cardiovascular: Normal rate.  Respiratory: Effort normal.  GI: Soft. She exhibits no distension. There is no tenderness. There is no rebound and no guarding.  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 She has normal reflexes.  Strength 5 out of 5 in lower extremity laterally  Skin: Skin is warm and dry.  Psychiatric: She has a normal mood and affect. Her behavior is normal. Thought content normal.     Levie Heritage 03/31/19 MAU Course  Procedures  MDM NST baseline 140s, moderate variability multiple accelerations no decelerations and no contractions  Assessment and Plan  1.  Back pain 2. [redacted] weeks gestation OMT done after patient permission. HVLA technique utilized. 3 areas treated with improvement of tissue texture and joint mobility. Patient tolerated procedure well.  Lilimited supply of tramadol. Continue heat, ice, flexeril. F/u as needed.

## 2018-03-30 NOTE — MAU Note (Addendum)
Pt presents to MAU with c/o abdominal pain and back pain that has been ongoing. Pt has noticed that baby has not moved as much since 32 weeks, she states that she has gone 5 hours without feeling any movement. Pt denies VB and LOF. Pt has concerns about baby not growing as it should be.

## 2018-03-30 NOTE — Telephone Encounter (Signed)
Pt called stating that she had a question about the growth of her uterus and her son's HR.

## 2018-03-30 NOTE — Telephone Encounter (Signed)
Needs refill on celexa.  [facility]

## 2018-03-30 NOTE — Discharge Instructions (Signed)

## 2018-03-31 ENCOUNTER — Telehealth: Payer: Self-pay

## 2018-03-31 ENCOUNTER — Encounter: Payer: Self-pay | Admitting: *Deleted

## 2018-03-31 ENCOUNTER — Other Ambulatory Visit: Payer: Self-pay | Admitting: Advanced Practice Midwife

## 2018-03-31 ENCOUNTER — Other Ambulatory Visit: Payer: Self-pay | Admitting: Family Medicine

## 2018-03-31 NOTE — Telephone Encounter (Signed)
Called patient concerning refill of Celexa. She says that she is aware of the risk of the drug and that her OB/ GYN is aware that she has been on it throughout her pregnancy (34 weeks). She would like it refilled.  Glennie Hawk.Citlaly Camplin R, [name]

## 2018-03-31 NOTE — Telephone Encounter (Signed)
LMOVM informing pt that she needed to talk to OB about celexa. Ozzy Bohlken, [name]  

## 2018-03-31 NOTE — Telephone Encounter (Signed)
[facility]. Deseree Bruna PotterBlount, [name]

## 2018-03-31 NOTE — Telephone Encounter (Signed)
-----   Message from Arlyce Harmanimothy Lockamy, DO sent at 03/30/2018  5:06 PM EDT ----- Regarding: med refill Patient is requesting refill of Celexa. 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 I would rather her follow up with her OB/GYN even though this is a chronic medication she has been on long term.   I would like OB/GYN to weigh in and if they feel like it is safe and patient is aware of risk I will refill.

## 2018-04-01 ENCOUNTER — Other Ambulatory Visit: Payer: Self-pay | Admitting: Family Medicine

## 2018-04-01 MED ORDER — CITALOPRAM HYDROBROMIDE 40 MG PO TABS: 40.0000 mg | ORAL_TABLET | Freq: Every day | ORAL | 3 refills | Status: DC

## 2018-04-01 NOTE — Progress Notes (Unsigned)
Refilled Celexa for patient

## 2018-04-02 NOTE — Telephone Encounter (Signed)
Called patient, no answer- left message stating we are trying to reach you to return your phone call, please call us back if you still need assistance.  

## 2018-04-03 ENCOUNTER — Inpatient Hospital Stay (HOSPITAL_COMMUNITY)
Admission: AD | Admit: 2018-04-03 | Discharge: 2018-04-03 | Disposition: A | Discharge status: Home / Self Care | Payer: Medicaid Other | Source: Ambulatory Visit | Attending: Obstetrics & Gynecology | Admitting: Obstetrics & Gynecology

## 2018-04-03 ENCOUNTER — Encounter (HOSPITAL_COMMUNITY): Payer: Self-pay | Admitting: *Deleted

## 2018-04-03 DIAGNOSIS — O99353 Diseases of the nervous system complicating pregnancy, third trimester: Secondary | ICD-10-CM | POA: Insufficient documentation

## 2018-04-03 DIAGNOSIS — O26893 Other specified pregnancy related conditions, third trimester: Secondary | ICD-10-CM | POA: Diagnosis present

## 2018-04-03 DIAGNOSIS — Z3A34 34 weeks gestation of pregnancy: Secondary | ICD-10-CM | POA: Diagnosis not present

## 2018-04-03 DIAGNOSIS — O99513 Diseases of the respiratory system complicating pregnancy, third trimester: Secondary | ICD-10-CM | POA: Insufficient documentation

## 2018-04-03 DIAGNOSIS — R109 Unspecified abdominal pain: Secondary | ICD-10-CM | POA: Diagnosis not present

## 2018-04-03 DIAGNOSIS — J45909 Unspecified asthma, uncomplicated: Secondary | ICD-10-CM | POA: Diagnosis not present

## 2018-04-03 DIAGNOSIS — Z87891 Personal history of nicotine dependence: Secondary | ICD-10-CM | POA: Diagnosis not present

## 2018-04-03 DIAGNOSIS — G8929 Other chronic pain: Secondary | ICD-10-CM | POA: Diagnosis not present

## 2018-04-03 DIAGNOSIS — M549 Dorsalgia, unspecified: Secondary | ICD-10-CM | POA: Insufficient documentation

## 2018-04-03 DIAGNOSIS — R1030 Lower abdominal pain, unspecified: Secondary | ICD-10-CM | POA: Insufficient documentation

## 2018-04-03 LAB — URINALYSIS, ROUTINE W REFLEX MICROSCOPIC
BILIRUBIN URINE: NEGATIVE
Glucose, UA: NEGATIVE mg/dL
HGB URINE DIPSTICK: NEGATIVE
Ketones, ur: 20 mg/dL — AB
Leukocytes, UA: NEGATIVE
Nitrite: NEGATIVE
PH: 7 (ref 5.0–8.0)
Protein, ur: NEGATIVE mg/dL
SPECIFIC GRAVITY, URINE: 1.008 (ref 1.005–1.030)

## 2018-04-03 NOTE — MAU Note (Signed)
+  lower abdominal pain Feels like period cramps Radiates to lower back, hips, and legs Rating pain 8-9/10 Started 1030 last night Intermittent  Denies LOF or VB +Fm; [id number]  Vitals:   04/03/18 1620  BP: 105/69  Pulse: (!) 106  Resp: 17  Temp: 97.7 F (36.5 C)  SpO2: 98%

## 2018-04-03 NOTE — MAU Provider Note (Signed)
History   G7 P2-0-4-2 at 34 weeks and 4 days and with complaints of lower abdominal pain cramping that has been going on most of the day.  Patient has been seen numerous times for this complaint.  Denies rupture membranes or vaginal bleeding.  CSN: [id number]  Arrival date & time 04/03/18  1534   None     Chief Complaint  Patient presents with  . Abdominal Pain    HPI  Past Medical History:  Diagnosis Date  . Asthma   . Chronic back pain   . H/O varicella   . Menorrhagia 08/19/2016  . UTI (urinary tract infection) 09/09/2017    Past Surgical History:  Procedure Laterality Date  . DILATION AND EVACUATION N/A 03/08/2014   Procedure: DILATATION AND EVACUATION ;  Surgeon: Purcell NailsAngela Y Roberts, MD;  Location: [facility];  Service: Gynecology;  Laterality: N/A;  with chromosomal studies, sent   . TONSILLECTOMY      Family History  Problem Relation Age of Onset  . Hypertension Father   . Diabetes Father   . Fibromyalgia Mother   . Arthritis Mother   . Depression Mother   . Diabetes Paternal Grandmother   . Cancer Paternal Grandmother   . Stroke Paternal Grandmother     Social History   Tobacco Use  . Smoking status: Former Smoker    Packs/day: 1.00    Types: Cigarettes    Last attempt to quit: 09/24/2017    Years since quitting: 0.5  . Smokeless tobacco: Never Used  . Tobacco comment: quit for pregnancy  Substance Use Topics  . Alcohol use: No  . Drug use: No    OB History    Gravida  7   Para  2   Term  2   Preterm      AB  4   Living  2     SAB  4   TAB      Ectopic      Multiple  0   Live Births  2           Review of Systems  Constitutional: Negative.   HENT: Negative.   Eyes: Negative.   Respiratory: Negative.   Cardiovascular: Negative.   Gastrointestinal: Positive for abdominal pain.  Endocrine: Negative.   Genitourinary: Negative.   Musculoskeletal: Negative.   Skin: Negative.   Allergic/Immunologic: Negative.   Neurological:  Negative.   Hematological: Negative.   Psychiatric/Behavioral: Negative.     Allergies  Patient has no known allergies.  Home Medications    BP 105/69 (BP Location: Right Arm)   Pulse (!) 106   Temp 97.7 F (36.5 C) (Oral)   Resp 17   Wt 184 lb (83.5 kg)   LMP 08/04/2017 (Approximate)   SpO2 98% Comment: ra  BMI 29.70 kg/m   Physical Exam  Constitutional: She is oriented to person, place, and time. She appears well-developed and well-nourished.  HENT:  Head: Normocephalic.  Cardiovascular: Normal rate, regular rhythm, normal heart sounds and intact distal pulses.  Pulmonary/Chest: Effort normal and breath sounds normal.  Abdominal: Normal appearance and bowel sounds are normal.  Genitourinary: Vagina normal and uterus normal.  Neurological: She is alert and oriented to person, place, and time.  Skin: Skin is warm and dry.  Psychiatric: She has a normal mood and affect. Her behavior is normal.    MAU Course  Procedures (including critical care time)  Labs Reviewed  URINALYSIS, ROUTINE W REFLEX MICROSCOPIC - Abnormal; Notable for the  following components:      Result Value   Ketones, ur 20 (*)    All other components within normal limits   No results found.   1. Abdominal pain in pregnancy, third trimester       MDM  Vital signs stable.  Vaginal exam 1-2 thick posterior and high.  Occasional mild discharge noted.  Fetal heart rate pattern reassuring with a sales.  Will discharge home not in labor

## 2018-04-03 NOTE — Discharge Instructions (Signed)
Braxton Hicks Contractions °Contractions of the uterus can occur throughout pregnancy, but they are not always a sign that you are in labor. You may have practice contractions called Braxton Hicks contractions. These false labor contractions are sometimes confused with true labor. °What are Braxton Hicks contractions? °Braxton Hicks contractions are tightening movements that occur in the muscles of the uterus before labor. Unlike true labor contractions, these contractions do not result in opening (dilation) and thinning of the cervix. Toward the end of pregnancy (32-34 weeks), Braxton Hicks contractions can happen more often and may become stronger. These contractions are sometimes difficult to tell apart from true labor because they can be very uncomfortable. You should not feel embarrassed if you go to the hospital with false labor. °Sometimes, the only way to tell if you are in true labor is for your health care provider to look for changes in the cervix. The health care provider will do a physical exam and may monitor your contractions. If you are not in true labor, the exam should show that your cervix is not dilating and your water has not broken. °If there are other health problems associated with your pregnancy, it is completely safe for you to be sent home with false labor. You may continue to have Braxton Hicks contractions until you go into true labor. °How to tell the difference between true labor and false labor °True labor °· Contractions last 30-70 seconds. °· Contractions become very regular. °· Discomfort is usually felt in the top of the uterus, and it spreads to the lower abdomen and low back. °· Contractions do not go away with walking. °· Contractions usually become more intense and increase in frequency. °· The cervix dilates and gets thinner. °False labor °· Contractions are usually shorter and not as strong as true labor contractions. °· Contractions are usually irregular. °· Contractions  are often felt in the front of the lower abdomen and in the groin. °· Contractions may go away when you walk around or change positions while lying down. °· Contractions get weaker and are shorter-lasting as time goes on. °· The cervix usually does not dilate or become thin. °Follow these instructions at home: °· Take over-the-counter and prescription medicines only as told by your health care provider. °· Keep up with your usual exercises and follow other instructions from your health care provider. °· Eat and drink lightly if you think you are going into labor. °· If Braxton Hicks contractions are making you uncomfortable: °? Change your position from lying down or resting to walking, or change from walking to resting. °? Sit and rest in a tub of warm water. °? Drink enough fluid to keep your urine pale yellow. Dehydration may cause these contractions. °? Do slow and deep breathing several times an hour. °· Keep all follow-up prenatal visits as told by your health care provider. This is important. °Contact a health care provider if: °· You have a fever. °· You have continuous pain in your abdomen. °Get help right away if: °· Your contractions become stronger, more regular, and closer together. °· You have fluid leaking or gushing from your vagina. °· You pass blood-tinged mucus (bloody show). °· You have bleeding from your vagina. °· You have low back pain that you never had before. °· You feel your baby’s head pushing down and causing pelvic pressure. °· Your baby is not moving inside you as much as it used to. °Summary °· Contractions that occur before labor are called Braxton   Hicks contractions, false labor, or practice contractions. °· Braxton Hicks contractions are usually shorter, weaker, farther apart, and less regular than true labor contractions. True labor contractions usually become progressively stronger and regular and they become more frequent. °· Manage discomfort from Braxton Hicks contractions by  changing position, resting in a warm bath, drinking plenty of water, or practicing deep breathing. °This information is not intended to replace advice given to you by your health care provider. Make sure you discuss any questions you have with your health care provider. °Document Released: 01/22/2017 Document Revised: 01/22/2017 Document Reviewed: 01/22/2017 °Elsevier Interactive Patient Education © 2018 Elsevier Inc. ° °

## 2018-04-12 ENCOUNTER — Encounter: Payer: Self-pay | Admitting: Advanced Practice Midwife

## 2018-04-12 ENCOUNTER — Ambulatory Visit (HOSPITAL_COMMUNITY): Payer: Medicaid Other | Attending: Advanced Practice Midwife | Admitting: Physical Therapy

## 2018-04-12 ENCOUNTER — Ambulatory Visit (INDEPENDENT_AMBULATORY_CARE_PROVIDER_SITE_OTHER): Payer: Self-pay | Admitting: Clinical

## 2018-04-12 ENCOUNTER — Telehealth: Payer: Self-pay | Admitting: General Practice

## 2018-04-12 ENCOUNTER — Ambulatory Visit (INDEPENDENT_AMBULATORY_CARE_PROVIDER_SITE_OTHER): Payer: Medicaid Other | Admitting: Advanced Practice Midwife

## 2018-04-12 VITALS — BP 115/81 | HR 101 | Wt 185.1 lb

## 2018-04-12 DIAGNOSIS — Z3480 Encounter for supervision of other normal pregnancy, unspecified trimester: Secondary | ICD-10-CM

## 2018-04-12 DIAGNOSIS — F4323 Adjustment disorder with mixed anxiety and depressed mood: Secondary | ICD-10-CM

## 2018-04-12 DIAGNOSIS — M549 Dorsalgia, unspecified: Secondary | ICD-10-CM

## 2018-04-12 DIAGNOSIS — O9989 Other specified diseases and conditions complicating pregnancy, childbirth and the puerperium: Secondary | ICD-10-CM

## 2018-04-12 DIAGNOSIS — O99891 Other specified diseases and conditions complicating pregnancy: Secondary | ICD-10-CM

## 2018-04-12 LAB — OB RESULTS CONSOLE GBS: STREP GROUP B AG: POSITIVE

## 2018-04-12 MED ORDER — ONDANSETRON 8 MG PO TBDP: 8.0000 mg | ORAL_TABLET | Freq: Three times a day (TID) | ORAL | 1 refills | Status: DC | PRN

## 2018-04-12 MED ORDER — TRAMADOL HCL 50 MG PO TABS: 50.0000 mg | ORAL_TABLET | Freq: Four times a day (QID) | ORAL | 0 refills | Status: DC | PRN

## 2018-04-12 NOTE — Patient Instructions (Signed)
Vaginal delivery means that you will give birth by pushing your baby out of your birth canal (vagina). A team of health care providers will help you before, during, and after vaginal delivery. Birth experiences are unique for every woman and every pregnancy, and birth experiences vary depending on where you choose to give birth. What should I do to prepare for my baby's birth? Before your baby is born, it is important to talk with your health care provider about:  Your labor and delivery preferences. These may include: ? Medicines that you may be given. ? How you will manage your pain. This might include non-medical pain relief techniques or injectable pain relief such as epidural analgesia. ? How you and your baby will be monitored during labor and delivery. ? Who may be in the labor and delivery room with you. ? Your feelings about surgical delivery of your baby (cesarean delivery, or C-section) if this becomes necessary. ? Your feelings about receiving donated blood through an IV tube (blood transfusion) if this becomes necessary.  Whether you are able: ? To take pictures or videos of the birth. ? To eat during labor and delivery. ? To move around, walk, or change positions during labor and delivery.  What to expect after your baby is born, such as: ? Whether delayed umbilical cord clamping and cutting is offered. ? Who will care for your baby right after birth. ? Medicines or tests that may be recommended for your baby. ? Whether breastfeeding is supported in your hospital or birth center. ? How long you will be in the hospital or birth center.  How any medical conditions you have may affect your baby or your labor and delivery experience.  To prepare for your baby's birth, you should also:  Attend all of your health care visits before delivery (prenatal visits) as recommended by your health care provider. This is important.  Prepare your home for your baby's arrival. Make sure  that you have: ? Diapers. ? Baby clothing. ? Feeding equipment. ? Safe sleeping arrangements for you and your baby.  Install a car seat in your vehicle. Have your car seat checked by a certified car seat installer to make sure that it is installed safely.  Think about who will help you with your new baby at home for at least the first several weeks after delivery.  What can I expect when I arrive at the birth center or hospital? Once you are in labor and have been admitted into the hospital or birth center, your health care provider may:  Review your pregnancy history and any concerns you have.  Insert an IV tube into one of your veins. This is used to give you fluids and medicines.  Check your blood pressure, pulse, temperature, and heart rate (vital signs).  Check whether your bag of water (amniotic sac) has broken (ruptured).  Talk with you about your birth plan and discuss pain control options.  Monitoring Your health care provider may monitor your contractions (uterine monitoring) and your baby's heart rate (fetal monitoring). You may need to be monitored:  Often, but not continuously (intermittently).  All the time or for long periods at a time (continuously). Continuous monitoring may be needed if: ? You are taking certain medicines, such as medicine to relieve pain or make your contractions stronger. ? You have pregnancy or labor complications.  Monitoring may be done by:  Placing a special stethoscope or a handheld monitoring device on your abdomen to check your   baby's heartbeat, and feeling your abdomen for contractions. This method of monitoring does not continuously record your baby's heartbeat or your contractions.  Placing monitors on your abdomen (external monitors) to record your baby's heartbeat and the frequency and length of contractions. You may not have to wear external monitors all the time.  Placing monitors inside of your uterus (internal monitors) to  record your baby's heartbeat and the frequency, length, and strength of your contractions. ? Your health care provider may use internal monitors if he or she needs more information about the strength of your contractions or your baby's heart rate. ? Internal monitors are put in place by passing a thin, flexible wire through your vagina and into your uterus. Depending on the type of monitor, it may remain in your uterus or on your baby's head until birth. ? Your health care provider will discuss the benefits and risks of internal monitoring with you and will ask for your permission before inserting the monitors.  Telemetry. This is a type of continuous monitoring that can be done with external or internal monitors. Instead of having to stay in bed, you are able to move around during telemetry. Ask your health care provider if telemetry is an option for you.  Physical exam Your health care provider may perform a physical exam. This may include:  Checking whether your baby is positioned: ? With the head toward your vagina (head-down). This is most common. ? With the head toward the top of your uterus (head-up or breech). If your baby is in a breech position, your health care provider may try to turn your baby to a head-down position so you can deliver vaginally. If it does not seem that your baby can be born vaginally, your provider may recommend surgery to deliver your baby. In rare cases, you may be able to deliver vaginally if your baby is head-up (breech delivery). ? Lying sideways (transverse). Babies that are lying sideways cannot be delivered vaginally.  Checking your cervix to determine: ? Whether it is thinning out (effacing). ? Whether it is opening up (dilating). ? How low your baby has moved into your birth canal.  What are the three stages of labor and delivery?  Normal labor and delivery is divided into the following three stages: Stage 1  Stage 1 is the longest stage of labor,  and it can last for hours or days. Stage 1 includes: ? Early labor. This is when contractions may be irregular, or regular and mild. Generally, early labor contractions are more than 10 minutes apart. ? Active labor. This is when contractions get longer, more regular, more frequent, and more intense. ? The transition phase. This is when contractions happen very close together, are very intense, and may last longer than during any other part of labor.  Contractions generally feel mild, infrequent, and irregular at first. They get stronger, more frequent (about every 2-3 minutes), and more regular as you progress from early labor through active labor and transition.  Many women progress through stage 1 naturally, but you may need help to continue making progress. If this happens, your health care provider may talk with you about: ? Rupturing your amniotic sac if it has not ruptured yet. ? Giving you medicine to help make your contractions stronger and more frequent.  Stage 1 ends when your cervix is completely dilated to 4 inches (10 cm) and completely effaced. This happens at the end of the transition phase. Stage 2  Once your cervix   is completely effaced and dilated to 4 inches (10 cm), you may start to feel an urge to push. It is common for the body to naturally take a rest before feeling the urge to push, especially if you received an epidural or certain other pain medicines. This rest period may last for up to 1-2 hours, depending on your unique labor experience.  During stage 2, contractions are generally less painful, because pushing helps relieve contraction pain. Instead of contraction pain, you may feel stretching and burning pain, especially when the widest part of your baby's head passes through the vaginal opening (crowning).  Your health care provider will closely monitor your pushing progress and your baby's progress through the vagina during stage 2.  Your health care provider may  massage the area of skin between your vaginal opening and anus (perineum) or apply warm compresses to your perineum. This helps it stretch as the baby's head starts to crown, which can help prevent perineal tearing. ? In some cases, an incision may be made in your perineum (episiotomy) to allow the baby to pass through the vaginal opening. An episiotomy helps to make the opening of the vagina larger to allow more room for the baby to fit through.  It is very important to breathe and focus so your health care provider can control the delivery of your baby's head. Your health care provider may have you decrease the intensity of your pushing, to help prevent perineal tearing.  After delivery of your baby's head, the shoulders and the rest of the body generally deliver very quickly and without difficulty.  Once your baby is delivered, the umbilical cord may be cut right away, or this may be delayed for 1-2 minutes, depending on your baby's health. This may vary among health care providers, hospitals, and birth centers.  If you and your baby are healthy enough, your baby may be placed on your chest or abdomen to help maintain the baby's temperature and to help you bond with each other. Some mothers and babies start breastfeeding at this time. Your health care team will dry your baby and help keep your baby warm during this time.  Your baby may need immediate care if he or she: ? Showed signs of distress during labor. ? Has a medical condition. ? Was born too early (prematurely). ? Had a bowel movement before birth (meconium). ? Shows signs of difficulty transitioning from being inside the uterus to being outside of the uterus. If you are planning to breastfeed, your health care team will help you begin a feeding. Stage 3  The third stage of labor starts immediately after the birth of your baby and ends after you deliver the placenta. The placenta is an organ that develops during pregnancy to provide  oxygen and nutrients to your baby in the womb.  Delivering the placenta may require some pushing, and you may have mild contractions. Breastfeeding can stimulate contractions to help you deliver the placenta.  After the placenta is delivered, your uterus should tighten (contract) and become firm. This helps to stop bleeding in your uterus. To help your uterus contract and to control bleeding, your health care provider may: ? Give you medicine by injection, through an IV tube, by mouth, or through your rectum (rectally). ? Massage your abdomen or perform a vaginal exam to remove any blood clots that are left in your uterus. ? Empty your bladder by placing a thin, flexible tube (catheter) into your bladder. ? Encourage you to   breastfeed your baby. After labor is over, you and your baby will be monitored closely to ensure that you are both healthy until you are ready to go home. Your health care team will teach you how to care for yourself and your baby. This information is not intended to replace advice given to you by your health care provider. Make sure you discuss any questions you have with your health care provider. Document Released: 06/17/2008 Document Revised: 03/28/2016 Document Reviewed: 09/23/2015 Elsevier Interactive Patient Education  2018 Elsevier Inc.  

## 2018-04-12 NOTE — BH Specialist Note (Signed)
Integrated Behavioral Health Initial Visit  MRN: [id number] Name: Marissa Contreras  Number of Integrated Behavioral Health Clinician visits:: 1/6 Session Start time: 11:30  Session End time: 12:00 Total time: 30 minutes  Type of Service: Integrated Behavioral Health- Individual/Family Interpretor:No. Interpretor Name and Language: n/a   Warm Hand Off Completed.       SUBJECTIVE: Marissa Machooni Comp is a 27 y.o. female accompanied by n/a Patient was referred by Thressa ShellerHeather Hogan, CNM for depression and anxiety. Patient reports the following symptoms/concerns: Pt states her primary concern today is back pain causing her symptoms of anxiety and depression to worsen; pt prefers non-pharmacologic methods of coping with pain, and is open to learning self-coping strategy today. Pt says this pregnancy feels more difficult than her other pregnancies, but is hopeful that she will recover as quickly as in the past.  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 Reduce symptoms of: anxiety and depression 2. Increase knowledge and/or ability of: self-management skills  3. Demonstrate ability to: Increase healthy adjustment to current life circumstances and Increase adequate support systems for patient/family  INTERVENTIONS: Interventions utilized: Mindfulness or Management consultantelaxation Training and Psychoeducation and/or Health Education  Standardized Assessments completed: GAD-7 and PHQ 9  ASSESSMENT: Patient currently experiencing Adjustment disorder with mixed anxious and depressed mood.   Patient may benefit from psychoeducation and brief therapeutic  interventions regarding coping with symptoms of anxiety and depression .  PLAN: 1. Follow up with behavioral health clinician on : One week 2. Behavioral recommendations:  -CALM relaxation breathing exercise twice daily (morning; at bedtime) -Read educational materials regarding coping with symptoms of anxiety and depression -Take home postpartum planner today; fill out at home, and discuss with family  3. Referral(s): Integrated [facility] Services (In Clinic) and [employer] [facility]:  Mom Talk support 4. "From scale of 1-10, how likely are you to follow plan?": 9  Shalina Norfolk C Akanksha Bellmore, LCSW

## 2018-04-12 NOTE — Progress Notes (Signed)
Pt states has been in a lot of pain in bach, hips, legs. Has ran out of Tramadol, needs re-fill.That med & flexeril was the only thing relieving the pain.

## 2018-04-12 NOTE — Progress Notes (Signed)
   PRENATAL VISIT NOTE  Subjective:  Marissa Contreras is a 27 y.o. [id number] at 1171w6d being seen today for ongoing prenatal care.  She is currently monitored for the following issues for this low-risk pregnancy and has Back pain; Supervision of other normal pregnancy, antepartum; Mild intermittent asthma; and Migraine on their problem list.  Patient reports no complaints.  Contractions: Irritability. Vag. Bleeding: None.  Movement: Present. Denies leaking of fluid.   The following portions of the patient's history were reviewed and updated as appropriate: allergies, current medications, past family history, past medical history, past social history, past surgical history and problem list. Problem list updated.  Objective:   Vitals:   04/12/18 1058  BP: 115/81  Pulse: (!) 101  Weight: 185 lb 1.6 oz (84 kg)    Fetal Status: Fetal Heart Rate (bpm): 153 Fundal Height: 36 cm Movement: Present  Presentation: Vertex  General:  Alert, oriented and cooperative. Patient is in no acute distress.  Skin: Skin is warm and dry. No rash noted.   Cardiovascular: Normal heart rate noted  Respiratory: Normal respiratory effort, no problems with respiration noted  Abdomen: Soft, gravid, appropriate for gestational age.  Pain/Pressure: Present     Pelvic: Cervical exam performed Dilation: 2 Effacement (%): 50    Extremities: Normal range of motion.  Edema: None  Mental Status: Normal mood and affect. Normal behavior. Normal judgment and thought content.   Assessment and Plan:  Pregnancy: [id number] at 7471w6d  1. Supervision of other normal pregnancy, antepartum - routine care - Culture, beta strep (group b only) - Recommended patient see Asher MuirJamie today. She declines at this time. She feels that her depression scores are elevated because of her pain, and that she cannot get up off the couch or move right now.   2. Back pain affecting pregnancy in third trimester - Has PT visit later today - Requesting tramadol  refill, limited supply given. [place] controlled database consulted. [place] other [place] [facility]    Term labor symptoms and general obstetric precautions including but not limited to vaginal bleeding, contractions, leaking of fluid and fetal movement were reviewed in detail with the patient. Please refer to After Visit Summary for other counseling recommendations.  Return in about 1 week (around 04/19/2018).  Future Appointments  Date Time Provider Department Center  04/12/2018  1:45 PM Bella Kennedyussell, Cynthia J, PT [facility] None  04/26/2018  4:15 PM Calvert CantorWeinhold, Samantha C, CNM [facility]  05/03/2018  4:15 PM Armando ReichertHogan, Dilan Novosad D, CNM [facility]  05/10/2018  4:15 PM Armando ReichertHogan, Jerran Tappan D, CNM [facility]  05/17/2018  4:15 PM Armando ReichertHogan, Leronda Lewers D, CNM [facility]    Thressa ShellerHeather Gerry Heaphy, CNM

## 2018-04-12 NOTE — Telephone Encounter (Signed)
[facility]

## 2018-04-13 NOTE — Telephone Encounter (Signed)
Called patient, no answer- left message on voicemail stating we are trying to reach you to return your phone call, please call us back if you still need assistance. 

## 2018-04-16 LAB — CULTURE, BETA STREP (GROUP B ONLY): Strep Gp B Culture: POSITIVE — AB

## 2018-04-18 ENCOUNTER — Encounter: Payer: Self-pay | Admitting: Student

## 2018-04-18 DIAGNOSIS — B951 Streptococcus, group B, as the cause of diseases classified elsewhere: Secondary | ICD-10-CM

## 2018-04-18 HISTORY — DX: Streptococcus, group b, as the cause of diseases classified elsewhere: B95.1

## 2018-04-26 ENCOUNTER — Ambulatory Visit (INDEPENDENT_AMBULATORY_CARE_PROVIDER_SITE_OTHER): Payer: Medicaid Other | Admitting: Advanced Practice Midwife

## 2018-04-26 ENCOUNTER — Other Ambulatory Visit: Payer: Self-pay | Admitting: Advanced Practice Midwife

## 2018-04-26 ENCOUNTER — Ambulatory Visit (INDEPENDENT_AMBULATORY_CARE_PROVIDER_SITE_OTHER): Payer: Medicaid Other | Admitting: Clinical

## 2018-04-26 VITALS — BP 116/70 | HR 97 | Wt 186.0 lb

## 2018-04-26 DIAGNOSIS — F4323 Adjustment disorder with mixed anxiety and depressed mood: Secondary | ICD-10-CM

## 2018-04-26 DIAGNOSIS — Z3483 Encounter for supervision of other normal pregnancy, third trimester: Secondary | ICD-10-CM

## 2018-04-26 NOTE — Patient Instructions (Signed)
Vaginal Delivery Vaginal delivery means that you will give birth by pushing your baby out of your birth canal (vagina). A team of health care providers will help you before, during, and after vaginal delivery. Birth experiences are unique for every woman and every pregnancy, and birth experiences vary depending on where you choose to give birth. What should I do to prepare for my baby's birth? Before your baby is born, it is important to talk with your health care provider about:  Your labor and delivery preferences. These may include: ? Medicines that you may be given. ? How you will manage your pain. This might include non-medical pain relief techniques or injectable pain relief such as epidural analgesia. ? How you and your baby will be monitored during labor and delivery. ? Who may be in the labor and delivery room with you. ? Your feelings about surgical delivery of your baby (cesarean delivery, or C-section) if this becomes necessary. ? Your feelings about receiving donated blood through an IV tube (blood transfusion) if this becomes necessary.  Whether you are able: ? To take pictures or videos of the birth. ? To eat during labor and delivery. ? To move around, walk, or change positions during labor and delivery.  What to expect after your baby is born, such as: ? Whether delayed umbilical cord clamping and cutting is offered. ? Who will care for your baby right after birth. ? Medicines or tests that may be recommended for your baby. ? Whether breastfeeding is supported in your hospital or birth center. ? How long you will be in the hospital or birth center.  How any medical conditions you have may affect your baby or your labor and delivery experience.  To prepare for your baby's birth, you should also:  Attend all of your health care visits before delivery (prenatal visits) as recommended by your health care provider. This is important.  Prepare your home for your baby's  arrival. Make sure that you have: ? Diapers. ? Baby clothing. ? Feeding equipment. ? Safe sleeping arrangements for you and your baby.  Install a car seat in your vehicle. Have your car seat checked by a certified car seat installer to make sure that it is installed safely.  Think about who will help you with your new baby at home for at least the first several weeks after delivery.  What can I expect when I arrive at the birth center or hospital? Once you are in labor and have been admitted into the hospital or birth center, your health care provider may:  Review your pregnancy history and any concerns you have.  Insert an IV tube into one of your veins. This is used to give you fluids and medicines.  Check your blood pressure, pulse, temperature, and heart rate (vital signs).  Check whether your bag of water (amniotic sac) has broken (ruptured).  Talk with you about your birth plan and discuss pain control options.  Monitoring Your health care provider may monitor your contractions (uterine monitoring) and your baby's heart rate (fetal monitoring). You may need to be monitored:  Often, but not continuously (intermittently).  All the time or for long periods at a time (continuously). Continuous monitoring may be needed if: ? You are taking certain medicines, such as medicine to relieve pain or make your contractions stronger. ? You have pregnancy or labor complications.  Monitoring may be done by:  Placing a special stethoscope or a handheld monitoring device on your abdomen to   check your baby's heartbeat, and feeling your abdomen for contractions. This method of monitoring does not continuously record your baby's heartbeat or your contractions.  Placing monitors on your abdomen (external monitors) to record your baby's heartbeat and the frequency and length of contractions. You may not have to wear external monitors all the time.  Placing monitors inside of your uterus  (internal monitors) to record your baby's heartbeat and the frequency, length, and strength of your contractions. ? Your health care provider may use internal monitors if he or she needs more information about the strength of your contractions or your baby's heart rate. ? Internal monitors are put in place by passing a thin, flexible wire through your vagina and into your uterus. Depending on the type of monitor, it may remain in your uterus or on your baby's head until birth. ? Your health care provider will discuss the benefits and risks of internal monitoring with you and will ask for your permission before inserting the monitors.  Telemetry. This is a type of continuous monitoring that can be done with external or internal monitors. Instead of having to stay in bed, you are able to move around during telemetry. Ask your health care provider if telemetry is an option for you.  Physical exam Your health care provider may perform a physical exam. This may include:  Checking whether your baby is positioned: ? With the head toward your vagina (head-down). This is most common. ? With the head toward the top of your uterus (head-up or breech). If your baby is in a breech position, your health care provider may try to turn your baby to a head-down position so you can deliver vaginally. If it does not seem that your baby can be born vaginally, your provider may recommend surgery to deliver your baby. In rare cases, you may be able to deliver vaginally if your baby is head-up (breech delivery). ? Lying sideways (transverse). Babies that are lying sideways cannot be delivered vaginally.  Checking your cervix to determine: ? Whether it is thinning out (effacing). ? Whether it is opening up (dilating). ? How low your baby has moved into your birth canal.  What are the three stages of labor and delivery?  Normal labor and delivery is divided into the following three stages: Stage 1  Stage 1 is the  longest stage of labor, and it can last for hours or days. Stage 1 includes: ? Early labor. This is when contractions may be irregular, or regular and mild. Generally, early labor contractions are more than 10 minutes apart. ? Active labor. This is when contractions get longer, more regular, more frequent, and more intense. ? The transition phase. This is when contractions happen very close together, are very intense, and may last longer than during any other part of labor.  Contractions generally feel mild, infrequent, and irregular at first. They get stronger, more frequent (about every 2-3 minutes), and more regular as you progress from early labor through active labor and transition.  Many women progress through stage 1 naturally, but you may need help to continue making progress. If this happens, your health care provider may talk with you about: ? Rupturing your amniotic sac if it has not ruptured yet. ? Giving you medicine to help make your contractions stronger and more frequent.  Stage 1 ends when your cervix is completely dilated to 4 inches (10 cm) and completely effaced. This happens at the end of the transition phase. Stage 2  Once   your cervix is completely effaced and dilated to 4 inches (10 cm), you may start to feel an urge to push. It is common for the body to naturally take a rest before feeling the urge to push, especially if you received an epidural or certain other pain medicines. This rest period may last for up to 1-2 hours, depending on your unique labor experience.  During stage 2, contractions are generally less painful, because pushing helps relieve contraction pain. Instead of contraction pain, you may feel stretching and burning pain, especially when the widest part of your baby's head passes through the vaginal opening (crowning).  Your health care provider will closely monitor your pushing progress and your baby's progress through the vagina during stage 2.  Your  health care provider may massage the area of skin between your vaginal opening and anus (perineum) or apply warm compresses to your perineum. This helps it stretch as the baby's head starts to crown, which can help prevent perineal tearing. ? In some cases, an incision may be made in your perineum (episiotomy) to allow the baby to pass through the vaginal opening. An episiotomy helps to make the opening of the vagina larger to allow more room for the baby to fit through.  It is very important to breathe and focus so your health care provider can control the delivery of your baby's head. Your health care provider may have you decrease the intensity of your pushing, to help prevent perineal tearing.  After delivery of your baby's head, the shoulders and the rest of the body generally deliver very quickly and without difficulty.  Once your baby is delivered, the umbilical cord may be cut right away, or this may be delayed for 1-2 minutes, depending on your baby's health. This may vary among health care providers, hospitals, and birth centers.  If you and your baby are healthy enough, your baby may be placed on your chest or abdomen to help maintain the baby's temperature and to help you bond with each other. Some mothers and babies start breastfeeding at this time. Your health care team will dry your baby and help keep your baby warm during this time.  Your baby may need immediate care if he or she: ? Showed signs of distress during labor. ? Has a medical condition. ? Was born too early (prematurely). ? Had a bowel movement before birth (meconium). ? Shows signs of difficulty transitioning from being inside the uterus to being outside of the uterus. If you are planning to breastfeed, your health care team will help you begin a feeding. Stage 3  The third stage of labor starts immediately after the birth of your baby and ends after you deliver the placenta. The placenta is an organ that develops  during pregnancy to provide oxygen and nutrients to your baby in the womb.  Delivering the placenta may require some pushing, and you may have mild contractions. Breastfeeding can stimulate contractions to help you deliver the placenta.  After the placenta is delivered, your uterus should tighten (contract) and become firm. This helps to stop bleeding in your uterus. To help your uterus contract and to control bleeding, your health care provider may: ? Give you medicine by injection, through an IV tube, by mouth, or through your rectum (rectally). ? Massage your abdomen or perform a vaginal exam to remove any blood clots that are left in your uterus. ? Empty your bladder by placing a thin, flexible tube (catheter) into your bladder. ? Encourage   you to breastfeed your baby. After labor is over, you and your baby will be monitored closely to ensure that you are both healthy until you are ready to go home. Your health care team will teach you how to care for yourself and your baby. This information is not intended to replace advice given to you by your health care provider. Make sure you discuss any questions you have with your health care provider. Document Released: 06/17/2008 Document Revised: 03/28/2016 Document Reviewed: 09/23/2015 Elsevier Interactive Patient Education  2018 [employer]  

## 2018-04-26 NOTE — Progress Notes (Signed)
   PRENATAL VISIT NOTE  Subjective:  Marissa Contreras is a 27 y.o. [phone] at 37w6dbeing seen today for ongoing prenatal care.  She is currently monitored for the following issues for this low-risk pregnancy and has Back pain; Supervision of other normal pregnancy, antepartum; Mild intermittent asthma; Migraine; and Positive GBS test on their problem list.  Patient reports no complaints.  Contractions: Irritability. Vag. Bleeding: None.  Movement: Present. Denies leaking of fluid.   The following portions of the patient's history were reviewed and updated as appropriate: allergies, current medications, past family history, past medical history, past social history, past surgical history and problem list. Problem list updated.  Objective:   Vitals:   04/26/18 1555  BP: 116/70  Pulse: 97  Weight: 186 lb (84.4 kg)    Fetal Status: Fetal Heart Rate (bpm): 154 Fundal Height: 37 cm Movement: Present  Presentation: Vertex  General:  Alert, oriented and cooperative. Patient is in no acute distress.  Skin: Skin is warm and dry. No rash noted.   Cardiovascular: Normal heart rate noted  Respiratory: Normal respiratory effort, no problems with respiration noted  Abdomen: Soft, gravid, appropriate for gestational age.  Pain/Pressure: Present     Pelvic: Cervical exam performed Dilation: 2 Effacement (%): 50 Station: -1  Extremities: Normal range of motion.  Edema: None  Mental Status: Normal mood and affect. Normal behavior. Normal judgment and thought content.   Assessment and Plan:  Pregnancy: [id number] 335w6d1. Encounter for supervision of other normal pregnancy in third trimester --No complaints today, continue routine care --Met with Marissa Contreras today, feeling better about stress management. States she has been relaxing with her children and manages stress by playing with them and cleaning her house.  Term labor symptoms and general obstetric precautions including but not limited  to vaginal bleeding, contractions, leaking of fluid and fetal movement were reviewed in detail with the patient. Please refer to After Visit Summary for other counseling recommendations.  Return in about 1 week (around 05/03/2018).  Future Appointments  Date Time Provider [place]8/08/2018  4:15 PM Ho[facility] MallCNM [facility]8/19/2019  4:15 PM HoTresea MallCNM [facility] [place]8/26/2019  4:15 PM HoTresea MallCNM [facility] [place]/05/19  4:25 PM

## 2018-04-26 NOTE — BH Specialist Note (Signed)
Integrated Behavioral Health Follow Up Visit  MRN: [id number] Name: Marissa Contreras  Number of Integrated Behavioral Health Clinician visits: 2/6 Session Start time: 3:15 Session End time: 3:45 Total time: 30 minutes  Type of Service: Integrated Behavioral Health- Individual/Family Interpretor:No. Interpretor Name and Language: n/a  SUBJECTIVE: Marissa Contreras is a 27 y.o. female accompanied by n/a Patient was referred by Thressa ShellerHeather Hogan, CNM for depression and anxiety. Patient reports the following symptoms/concerns: Pt states her primary concern today is physical pain, increasing as pregnancy progresses. Pt feels she is coping and functioning well, in spite of the pain, and is keeping a positive outlook.  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  Reduce symptoms of: anxiety and depression  2.  Demonstrate ability to: Increase healthy adjustment to current life circumstances  INTERVENTIONS: Interventions utilized:  Functional Assessment of ADLs and Link to [facility] Standardized Assessments completed: GAD-7 and PHQ 9  ASSESSMENT: Patient currently experiencing Adjustment disorder with mixed anxious and depressed mood  Patient may benefit from continued brief therapeutic interventions regarding coping with symptoms of anxiety and depression.  PLAN: 1. Follow up with behavioral health clinician on : Postpartum visit, or earlier, as needed 2. Behavioral recommendations:  -Continue self-coping strategies that have worked in the past (relaxation breathing, pacing, and positive  outlook; prioritizing daily naps)  3. Referral(s): Integrated Behavioral Health Services (In Clinic) 4. "From scale of 1-10, how likely are you to follow plan?": 10  Rae LipsJamie C McMannes, LCSW  Depression screen [facility] 2/[facility] 04/26/2018 04/12/2018 03/29/2018 03/15/2018 03/01/2018  Decreased Interest 3 3 1 2  0  Down, Depressed, Hopeless 3 3 3 3 1   PHQ - 2 Score 6 6 4 5 1   Altered sleeping 3 3 3 3 3   Tired, decreased energy 3 3 3 2 1   Change in appetite 3 3 3 3 3   Feeling bad or failure about yourself  3 3 2 2 1   Trouble concentrating 3 3 3 2 1   Moving slowly or fidgety/restless 3 3 3 2 1   Suicidal thoughts 0 0 0 0 1  PHQ-9 Score 24 24 21 19 12   Difficult doing work/chores - - - - Very difficult  Some recent data might be hidden   GAD 7 : Generalized Anxiety Score 04/26/2018 04/12/2018 03/29/2018 03/15/2018  Nervous, Anxious, on Edge 1 3 1 2   Control/stop worrying 2 2 2 1   Worry too much - different things 3 3 2 2   Trouble relaxing 3 3 3 3   Restless 3 3 3 3   Easily annoyed or irritable 3 3 3 3   Afraid - awful might happen 1 2 0 0  Total GAD 7 Score 16 19 14 14   Anxiety Difficulty - - - -

## 2018-04-27 ENCOUNTER — Telehealth: Payer: Self-pay | Admitting: Family Medicine

## 2018-04-27 NOTE — Telephone Encounter (Signed)
Patient called said her pharmacy told her to call us to get a Refill on all her medications.

## 2018-04-29 ENCOUNTER — Encounter (HOSPITAL_COMMUNITY): Payer: Self-pay | Admitting: *Deleted

## 2018-04-29 ENCOUNTER — Inpatient Hospital Stay (HOSPITAL_COMMUNITY)
Admission: AD | Admit: 2018-04-29 | Discharge: 2018-04-29 | Disposition: A | Discharge status: Home / Self Care | Payer: Medicaid Other | Source: Ambulatory Visit | Attending: Obstetrics and Gynecology | Admitting: Obstetrics and Gynecology

## 2018-04-29 DIAGNOSIS — Z3480 Encounter for supervision of other normal pregnancy, unspecified trimester: Secondary | ICD-10-CM

## 2018-04-29 DIAGNOSIS — O212 Late vomiting of pregnancy: Secondary | ICD-10-CM | POA: Diagnosis not present

## 2018-04-29 DIAGNOSIS — Z3A38 38 weeks gestation of pregnancy: Secondary | ICD-10-CM | POA: Diagnosis not present

## 2018-04-29 HISTORY — DX: Depression, unspecified: F32.A

## 2018-04-29 HISTORY — DX: Anxiety disorder, unspecified: F41.9

## 2018-04-29 HISTORY — DX: Major depressive disorder, single episode, unspecified: F32.9

## 2018-04-29 MED ORDER — PROMETHAZINE HCL 25 MG PO TABS: 25.0000 mg | ORAL_TABLET | Freq: Four times a day (QID) | ORAL | 0 refills | Status: DC | PRN

## 2018-04-29 MED ORDER — PROMETHAZINE HCL 25 MG PO TABS
25.0000 mg | ORAL_TABLET | Freq: Once | ORAL | Status: AC
  Administered 2018-04-29: 25 mg via ORAL
  Filled 2018-04-29: qty 1

## 2018-04-29 MED ORDER — CYCLOBENZAPRINE HCL 10 MG PO TABS: 10.0000 mg | ORAL_TABLET | Freq: Three times a day (TID) | ORAL | 1 refills | Status: DC | PRN

## 2018-04-29 NOTE — Discharge Instructions (Signed)
Braxton Hicks Contractions °Contractions of the uterus can occur throughout pregnancy, but they are not always a sign that you are in labor. You may have practice contractions called Braxton Hicks contractions. These false labor contractions are sometimes confused with true labor. °What are Braxton Hicks contractions? °Braxton Hicks contractions are tightening movements that occur in the muscles of the uterus before labor. Unlike true labor contractions, these contractions do not result in opening (dilation) and thinning of the cervix. Toward the end of pregnancy (32-34 weeks), Braxton Hicks contractions can happen more often and may become stronger. These contractions are sometimes difficult to tell apart from true labor because they can be very uncomfortable. You should not feel embarrassed if you go to the hospital with false labor. °Sometimes, the only way to tell if you are in true labor is for your health care provider to look for changes in the cervix. The health care provider will do a physical exam and may monitor your contractions. If you are not in true labor, the exam should show that your cervix is not dilating and your water has not broken. °If there are other health problems associated with your pregnancy, it is completely safe for you to be sent home with false labor. You may continue to have Braxton Hicks contractions until you go into true labor. °How to tell the difference between true labor and false labor °True labor °· Contractions last 30-70 seconds. °· Contractions become very regular. °· Discomfort is usually felt in the top of the uterus, and it spreads to the lower abdomen and low back. °· Contractions do not go away with walking. °· Contractions usually become more intense and increase in frequency. °· The cervix dilates and gets thinner. °False labor °· Contractions are usually shorter and not as strong as true labor contractions. °· Contractions are usually irregular. °· Contractions  are often felt in the front of the lower abdomen and in the groin. °· Contractions may go away when you walk around or change positions while lying down. °· Contractions get weaker and are shorter-lasting as time goes on. °· The cervix usually does not dilate or become thin. °Follow these instructions at home: °· Take over-the-counter and prescription medicines only as told by your health care provider. °· Keep up with your usual exercises and follow other instructions from your health care provider. °· Eat and drink lightly if you think you are going into labor. °· If Braxton Hicks contractions are making you uncomfortable: °? Change your position from lying down or resting to walking, or change from walking to resting. °? Sit and rest in a tub of warm water. °? Drink enough fluid to keep your urine pale yellow. Dehydration may cause these contractions. °? Do slow and deep breathing several times an hour. °· Keep all follow-up prenatal visits as told by your health care provider. This is important. °Contact a health care provider if: °· You have a fever. °· You have continuous pain in your abdomen. °Get help right away if: °· Your contractions become stronger, more regular, and closer together. °· You have fluid leaking or gushing from your vagina. °· You pass blood-tinged mucus (bloody show). °· You have bleeding from your vagina. °· You have low back pain that you never had before. °· You feel your baby’s head pushing down and causing pelvic pressure. °· Your baby is not moving inside you as much as it used to. °Summary °· Contractions that occur before labor are called Braxton   Hicks contractions, false labor, or practice contractions. °· Braxton Hicks contractions are usually shorter, weaker, farther apart, and less regular than true labor contractions. True labor contractions usually become progressively stronger and regular and they become more frequent. °· Manage discomfort from Braxton Hicks contractions by  changing position, resting in a warm bath, drinking plenty of water, or practicing deep breathing. °This information is not intended to replace advice given to you by your health care provider. Make sure you discuss any questions you have with your health care provider. °Document Released: 01/22/2017 Document Revised: 01/22/2017 Document Reviewed: 01/22/2017 °Elsevier Interactive Patient Education © 2018 Elsevier Inc. ° °

## 2018-04-29 NOTE — MAU Note (Signed)
Pt c/o vomiting 2-4 times daily for the past three days, pressure and pain in her vagina, rectum, clitoris and hips. Active baby. Denies any vag bleeding but reports mucous like discharge.

## 2018-05-01 ENCOUNTER — Inpatient Hospital Stay (HOSPITAL_COMMUNITY)
Admission: AD | Admit: 2018-05-01 | Discharge: 2018-05-01 | Disposition: A | Discharge status: Home / Self Care | Payer: Medicaid Other | Source: Ambulatory Visit | Attending: Family Medicine | Admitting: Family Medicine

## 2018-05-01 ENCOUNTER — Encounter (HOSPITAL_COMMUNITY): Payer: Self-pay

## 2018-05-01 DIAGNOSIS — O471 False labor at or after 37 completed weeks of gestation: Secondary | ICD-10-CM | POA: Insufficient documentation

## 2018-05-01 DIAGNOSIS — Z3480 Encounter for supervision of other normal pregnancy, unspecified trimester: Secondary | ICD-10-CM

## 2018-05-01 DIAGNOSIS — O479 False labor, unspecified: Secondary | ICD-10-CM

## 2018-05-01 LAB — URINALYSIS, ROUTINE W REFLEX MICROSCOPIC
Bilirubin Urine: NEGATIVE
GLUCOSE, UA: NEGATIVE mg/dL
Hgb urine dipstick: NEGATIVE
Ketones, ur: 5 mg/dL — AB
Leukocytes, UA: NEGATIVE
NITRITE: NEGATIVE
PROTEIN: 30 mg/dL — AB
Specific Gravity, Urine: 1.026 (ref 1.005–1.030)
pH: 6 (ref 5.0–8.0)

## 2018-05-01 MED ORDER — PROMETHAZINE HCL 25 MG/ML IJ SOLN
12.5000 mg | Freq: Once | INTRAMUSCULAR | Status: AC
  Administered 2018-05-01: 12.5 mg via INTRAMUSCULAR
  Filled 2018-05-01: qty 1

## 2018-05-01 MED ORDER — POLYETHYLENE GLYCOL 3350 17 GM/SCOOP PO POWD: 17.0000 g | Freq: Every day | ORAL | 0 refills | Status: DC

## 2018-05-01 MED ORDER — BUTORPHANOL TARTRATE 1 MG/ML IJ SOLN
2.0000 mg | Freq: Once | INTRAMUSCULAR | Status: AC
  Administered 2018-05-01: 2 mg via INTRAMUSCULAR
  Filled 2018-05-01: qty 2

## 2018-05-01 NOTE — Discharge Instructions (Signed)

## 2018-05-01 NOTE — MAU Note (Signed)
Having a lot of pelvic pain and back pain since Friday. Denies bleeding or LOF. 4cm last sve

## 2018-05-01 NOTE — MAU Note (Signed)
I have communicated with Dr Truddie Cocoell and reviewed vital signs:  Vitals:   05/01/18 2039 05/01/18 2214  BP:  104/76  Pulse:    Resp:    Temp:    SpO2: 99%     Vaginal exam:  Dilation: 4 Effacement (%): 70, 80 Cervical Position: Middle Station: -1 Presentation: Vertex Exam by:: TLYTLE RN,   Also reviewed contraction pattern and that non-stress test is reactive.  It has been documented that patient is contracting every irregularly  minutes with no cervical change over 1 hour not indicating active labor.  Patient denies any other complaints.  Based on this report provider has given order for discharge.  A discharge order and diagnosis entered by a provider.   Labor discharge instructions reviewed with patient.

## 2018-05-03 ENCOUNTER — Encounter: Payer: Self-pay | Admitting: Advanced Practice Midwife

## 2018-05-03 ENCOUNTER — Ambulatory Visit (INDEPENDENT_AMBULATORY_CARE_PROVIDER_SITE_OTHER): Payer: Medicaid Other | Admitting: Advanced Practice Midwife

## 2018-05-03 VITALS — BP 130/79 | HR 111 | Wt 186.4 lb

## 2018-05-03 DIAGNOSIS — Z3480 Encounter for supervision of other normal pregnancy, unspecified trimester: Secondary | ICD-10-CM

## 2018-05-03 MED ORDER — ALBUTEROL SULFATE HFA 108 (90 BASE) MCG/ACT IN AERS: 2.0000 | INHALATION_SPRAY | Freq: Four times a day (QID) | RESPIRATORY_TRACT | 2 refills | Status: DC | PRN

## 2018-05-03 MED ORDER — PRENATAL VITAMIN PLUS LOW IRON 27-1 MG PO TABS: 1.0000 | ORAL_TABLET | Freq: Every day | ORAL | 11 refills | Status: DC

## 2018-05-03 MED ORDER — TRAMADOL HCL 50 MG PO TABS: 50.0000 mg | ORAL_TABLET | Freq: Four times a day (QID) | ORAL | 0 refills | Status: DC | PRN

## 2018-05-03 NOTE — Progress Notes (Signed)
   PRENATAL VISIT NOTE  Subjective:  Marissa Contreras is a 27 y.o. [id number] at 7931w6d being seen today for ongoing prenatal care.  She is currently monitored for the following issues for this low-risk pregnancy and has Back pain; Supervision of other normal pregnancy, antepartum; Mild intermittent asthma; Migraine; and Positive GBS test on their problem list.  Patient reports no complaints.  Contractions: Irregular. Vag. Bleeding: None.  Movement: Present. Denies leaking of fluid.   The following portions of the patient's history were reviewed and updated as appropriate: allergies, current medications, past family history, past medical history, past social history, past surgical history and problem list. Problem list updated.  Objective:   Vitals:   05/03/18 1610  BP: 130/79  Pulse: (!) 111  Weight: 186 lb 6 oz (84.5 kg)    Fetal Status: Fetal Heart Rate (bpm): 150 Fundal Height: 39 cm Movement: Present     General:  Alert, oriented and cooperative. Patient is in no acute distress.  Skin: Skin is warm and dry. No rash noted.   Cardiovascular: Normal heart rate noted  Respiratory: Normal respiratory effort, no problems with respiration noted  Abdomen: Soft, gravid, appropriate for gestational age.  Pain/Pressure: Present     Pelvic: Cervical exam performed Dilation: 2 Effacement (%): 80    Extremities: Normal range of motion.  Edema: None  Mental Status: Normal mood and affect. Normal behavior. Normal judgment and thought content.   Assessment and Plan:  Pregnancy: [id number] at 4031w6d  1. Supervision of other normal pregnancy, antepartum - Routine care - BPP/NST on 05/14/18   Term labor symptoms and general obstetric precautions including but not limited to vaginal bleeding, contractions, leaking of fluid and fetal movement were reviewed in detail with the patient. Please refer to After Visit Summary for other counseling recommendations.  Return in about 1 week (around  05/10/2018).  Future Appointments  Date Time Provider Department Center  05/10/2018  4:15 PM Armando ReichertHogan, Fredrich Cory D, [facility]  05/17/2018  4:15 PM Armando ReichertHogan, Katlynne Mckercher D, CNM [facility]    Thressa ShellerHeather Pleas Carneal, CNM

## 2018-05-03 NOTE — Telephone Encounter (Signed)
I called Marissa Contreras and as we were discussing which meds she needed refilled - she states tramadol- I informed her she may need to see a provider and  she said she has an appointment today and will discuss with provider then.

## 2018-05-06 ENCOUNTER — Encounter (HOSPITAL_COMMUNITY): Payer: Self-pay | Admitting: *Deleted

## 2018-05-06 ENCOUNTER — Other Ambulatory Visit: Payer: Self-pay

## 2018-05-06 ENCOUNTER — Inpatient Hospital Stay (HOSPITAL_COMMUNITY)
Admission: AD | Admit: 2018-05-06 | Discharge: 2018-05-06 | Disposition: A | Discharge status: Home / Self Care | Payer: Medicaid Other | Source: Ambulatory Visit | Attending: Obstetrics and Gynecology | Admitting: Obstetrics and Gynecology

## 2018-05-06 DIAGNOSIS — O471 False labor at or after 37 completed weeks of gestation: Secondary | ICD-10-CM | POA: Insufficient documentation

## 2018-05-06 DIAGNOSIS — Z3A39 39 weeks gestation of pregnancy: Secondary | ICD-10-CM | POA: Diagnosis not present

## 2018-05-06 DIAGNOSIS — Z3480 Encounter for supervision of other normal pregnancy, unspecified trimester: Secondary | ICD-10-CM

## 2018-05-06 DIAGNOSIS — O479 False labor, unspecified: Secondary | ICD-10-CM

## 2018-05-06 LAB — URINALYSIS, ROUTINE W REFLEX MICROSCOPIC
Bilirubin Urine: NEGATIVE
GLUCOSE, UA: NEGATIVE mg/dL
HGB URINE DIPSTICK: NEGATIVE
Ketones, ur: NEGATIVE mg/dL
LEUKOCYTES UA: NEGATIVE
NITRITE: NEGATIVE
PROTEIN: 30 mg/dL — AB
SPECIFIC GRAVITY, URINE: 1.03 (ref 1.005–1.030)
pH: 5 (ref 5.0–8.0)

## 2018-05-06 NOTE — MAU Note (Signed)
Pt presents with c/o mucusy discharge with blood clot, lost part of mucous plug on Tuesday.  Denies LOF or VB other than blood clot.  Reports +FM

## 2018-05-06 NOTE — Discharge Instructions (Signed)
Braxton Hicks Contractions °Contractions of the uterus can occur throughout pregnancy, but they are not always a sign that you are in labor. You may have practice contractions called Braxton Hicks contractions. These false labor contractions are sometimes confused with true labor. °What are Braxton Hicks contractions? °Braxton Hicks contractions are tightening movements that occur in the muscles of the uterus before labor. Unlike true labor contractions, these contractions do not result in opening (dilation) and thinning of the cervix. Toward the end of pregnancy (32-34 weeks), Braxton Hicks contractions can happen more often and may become stronger. These contractions are sometimes difficult to tell apart from true labor because they can be very uncomfortable. You should not feel embarrassed if you go to the hospital with false labor. °Sometimes, the only way to tell if you are in true labor is for your health care provider to look for changes in the cervix. The health care provider will do a physical exam and may monitor your contractions. If you are not in true labor, the exam should show that your cervix is not dilating and your water has not broken. °If there are other health problems associated with your pregnancy, it is completely safe for you to be sent home with false labor. You may continue to have Braxton Hicks contractions until you go into true labor. °How to tell the difference between true labor and false labor °True labor °· Contractions last 30-70 seconds. °· Contractions become very regular. °· Discomfort is usually felt in the top of the uterus, and it spreads to the lower abdomen and low back. °· Contractions do not go away with walking. °· Contractions usually become more intense and increase in frequency. °· The cervix dilates and gets thinner. °False labor °· Contractions are usually shorter and not as strong as true labor contractions. °· Contractions are usually irregular. °· Contractions  are often felt in the front of the lower abdomen and in the groin. °· Contractions may go away when you walk around or change positions while lying down. °· Contractions get weaker and are shorter-lasting as time goes on. °· The cervix usually does not dilate or become thin. °Follow these instructions at home: °· Take over-the-counter and prescription medicines only as told by your health care provider. °· Keep up with your usual exercises and follow other instructions from your health care provider. °· Eat and drink lightly if you think you are going into labor. °· If Braxton Hicks contractions are making you uncomfortable: °? Change your position from lying down or resting to walking, or change from walking to resting. °? Sit and rest in a tub of warm water. °? Drink enough fluid to keep your urine pale yellow. Dehydration may cause these contractions. °? Do slow and deep breathing several times an hour. °· Keep all follow-up prenatal visits as told by your health care provider. This is important. °Contact a health care provider if: °· You have a fever. °· You have continuous pain in your abdomen. °Get help right away if: °· Your contractions become stronger, more regular, and closer together. °· You have fluid leaking or gushing from your vagina. °· You pass blood-tinged mucus (bloody show). °· You have bleeding from your vagina. °· You have low back pain that you never had before. °· You feel your baby’s head pushing down and causing pelvic pressure. °· Your baby is not moving inside you as much as it used to. °Summary °· Contractions that occur before labor are called Braxton   Hicks contractions, false labor, or practice contractions. °· Braxton Hicks contractions are usually shorter, weaker, farther apart, and less regular than true labor contractions. True labor contractions usually become progressively stronger and regular and they become more frequent. °· Manage discomfort from Braxton Hicks contractions by  changing position, resting in a warm bath, drinking plenty of water, or practicing deep breathing. °This information is not intended to replace advice given to you by your health care provider. Make sure you discuss any questions you have with your health care provider. °Document Released: 01/22/2017 Document Revised: 01/22/2017 Document Reviewed: 01/22/2017 °Elsevier Interactive Patient Education © 2018 Elsevier Inc. ° °

## 2018-05-06 NOTE — MAU Note (Signed)
I have communicated with Dr Constance Goltzlson and reviewed vital signs:  Vitals:   05/06/18 1308 05/06/18 1655  BP: 124/86 120/82  Pulse: (!) 120 98  Resp: 20 18  Temp: 97.7 F (36.5 C)   SpO2: 96%     Vaginal exam:  Dilation: 4.5 Effacement (%): 70 Cervical Position: Middle Station: -2 Presentation: Vertex Exam by:: Dorrene GermanJ. Lowe RN,   Also reviewed contraction pattern and that non-stress test is reactive.  It has been documented that patient is contracting occasionally with minimal cervical change over 3 hours not indicating active labor.  Patient denies any other complaints.  Based on this report provider has given order for discharge.  A discharge order and diagnosis entered by a provider.   Labor discharge instructions reviewed with patient.

## 2018-05-09 ENCOUNTER — Encounter (HOSPITAL_COMMUNITY): Payer: Self-pay | Admitting: *Deleted

## 2018-05-09 ENCOUNTER — Inpatient Hospital Stay (HOSPITAL_COMMUNITY): Payer: Medicaid Other | Admitting: Anesthesiology

## 2018-05-09 ENCOUNTER — Inpatient Hospital Stay (HOSPITAL_COMMUNITY)
Admission: AD | Admit: 2018-05-09 | Discharge: 2018-05-11 | DRG: 798 | Disposition: A | Discharge status: Home / Self Care | Payer: Medicaid Other | Attending: Obstetrics and Gynecology | Admitting: Obstetrics and Gynecology

## 2018-05-09 ENCOUNTER — Other Ambulatory Visit: Payer: Self-pay

## 2018-05-09 DIAGNOSIS — O4290 Premature rupture of membranes, unspecified as to length of time between rupture and onset of labor, unspecified weeks of gestation: Secondary | ICD-10-CM | POA: Diagnosis present

## 2018-05-09 DIAGNOSIS — J4520 Mild intermittent asthma, uncomplicated: Secondary | ICD-10-CM | POA: Diagnosis present

## 2018-05-09 DIAGNOSIS — Z3A39 39 weeks gestation of pregnancy: Secondary | ICD-10-CM | POA: Diagnosis not present

## 2018-05-09 DIAGNOSIS — Z9851 Tubal ligation status: Secondary | ICD-10-CM

## 2018-05-09 DIAGNOSIS — O99824 Streptococcus B carrier state complicating childbirth: Secondary | ICD-10-CM | POA: Diagnosis present

## 2018-05-09 DIAGNOSIS — Z87891 Personal history of nicotine dependence: Secondary | ICD-10-CM | POA: Diagnosis not present

## 2018-05-09 DIAGNOSIS — Z302 Encounter for sterilization: Secondary | ICD-10-CM | POA: Diagnosis not present

## 2018-05-09 DIAGNOSIS — O9952 Diseases of the respiratory system complicating childbirth: Secondary | ICD-10-CM | POA: Diagnosis not present

## 2018-05-09 DIAGNOSIS — O4292 Full-term premature rupture of membranes, unspecified as to length of time between rupture and onset of labor: Secondary | ICD-10-CM | POA: Diagnosis not present

## 2018-05-09 HISTORY — DX: Headache: R51

## 2018-05-09 HISTORY — DX: Headache, unspecified: R51.9

## 2018-05-09 LAB — TYPE AND SCREEN
ABO/RH(D): A POS
ANTIBODY SCREEN: NEGATIVE

## 2018-05-09 LAB — CBC
HCT: 32.6 % — ABNORMAL LOW (ref 36.0–46.0)
Hemoglobin: 10.4 g/dL — ABNORMAL LOW (ref 12.0–15.0)
MCH: 25.1 pg — ABNORMAL LOW (ref 26.0–34.0)
MCHC: 31.9 g/dL (ref 30.0–36.0)
MCV: 78.6 fL (ref 78.0–100.0)
Platelets: 184 10*3/uL (ref 150–400)
RBC: 4.15 MIL/uL (ref 3.87–5.11)
RDW: 15.9 % — ABNORMAL HIGH (ref 11.5–15.5)
WBC: 10.5 10*3/uL (ref 4.0–10.5)

## 2018-05-09 LAB — POCT FERN TEST
POCT Fern Test: NEGATIVE
POCT Fern Test: POSITIVE

## 2018-05-09 MED ORDER — SIMETHICONE 80 MG PO CHEW: 80.0000 mg | CHEWABLE_TABLET | ORAL | Status: DC | PRN

## 2018-05-09 MED ORDER — OXYTOCIN 40 UNITS IN LACTATED RINGERS INFUSION - SIMPLE MED: 2.5000 [IU]/h | INTRAVENOUS | Status: DC

## 2018-05-09 MED ORDER — OXYCODONE-ACETAMINOPHEN 5-325 MG PO TABS: 2.0000 | ORAL_TABLET | ORAL | Status: DC | PRN

## 2018-05-09 MED ORDER — OXYCODONE HCL 5 MG PO TABS: 5.0000 mg | ORAL_TABLET | ORAL | Status: DC | PRN

## 2018-05-09 MED ORDER — COCONUT OIL OIL: 1.0000 "application " | TOPICAL_OIL | Status: DC | PRN

## 2018-05-09 MED ORDER — ONDANSETRON HCL 4 MG PO TABS: 4.0000 mg | ORAL_TABLET | ORAL | Status: DC | PRN

## 2018-05-09 MED ORDER — OXYTOCIN 40 UNITS IN LACTATED RINGERS INFUSION - SIMPLE MED
1.0000 m[IU]/min | INTRAVENOUS | Status: DC
  Administered 2018-05-09: 2 m[IU]/min via INTRAVENOUS
  Filled 2018-05-09: qty 1000

## 2018-05-09 MED ORDER — LACTATED RINGERS IV SOLN: 500.0000 mL | INTRAVENOUS | Status: DC | PRN

## 2018-05-09 MED ORDER — EPHEDRINE 5 MG/ML INJ
10.0000 mg | INTRAVENOUS | Status: DC | PRN
  Filled 2018-05-09: qty 2

## 2018-05-09 MED ORDER — FENTANYL 2.5 MCG/ML BUPIVACAINE 1/10 % EPIDURAL INFUSION (WH - ANES)
14.0000 mL/h | INTRAMUSCULAR | Status: DC | PRN
  Administered 2018-05-09: 14 mL/h via EPIDURAL
  Filled 2018-05-09: qty 100

## 2018-05-09 MED ORDER — ONDANSETRON HCL 4 MG/2ML IJ SOLN: 4.0000 mg | Freq: Four times a day (QID) | INTRAMUSCULAR | Status: DC | PRN

## 2018-05-09 MED ORDER — DIPHENHYDRAMINE HCL 25 MG PO CAPS
25.0000 mg | ORAL_CAPSULE | Freq: Four times a day (QID) | ORAL | Status: DC | PRN
Start: 2018-05-09 — End: 2018-05-10

## 2018-05-09 MED ORDER — DIPHENHYDRAMINE HCL 50 MG/ML IJ SOLN: 12.5000 mg | INTRAMUSCULAR | Status: DC | PRN

## 2018-05-09 MED ORDER — SENNOSIDES-DOCUSATE SODIUM 8.6-50 MG PO TABS
2.0000 | ORAL_TABLET | ORAL | Status: DC
  Administered 2018-05-09: 2 via ORAL
  Filled 2018-05-09: qty 2

## 2018-05-09 MED ORDER — TERBUTALINE SULFATE 1 MG/ML IJ SOLN
0.2500 mg | Freq: Once | INTRAMUSCULAR | Status: DC | PRN
  Filled 2018-05-09: qty 1

## 2018-05-09 MED ORDER — PHENYLEPHRINE 40 MCG/ML (10ML) SYRINGE FOR IV PUSH (FOR BLOOD PRESSURE SUPPORT)
80.0000 ug | PREFILLED_SYRINGE | INTRAVENOUS | Status: DC | PRN
  Filled 2018-05-09: qty 5

## 2018-05-09 MED ORDER — SODIUM CHLORIDE 0.9 % IV SOLN
5.0000 10*6.[IU] | Freq: Once | INTRAVENOUS | Status: AC
  Administered 2018-05-09: 5 10*6.[IU] via INTRAVENOUS
  Filled 2018-05-09: qty 5

## 2018-05-09 MED ORDER — LACTATED RINGERS IV SOLN
INTRAVENOUS | Status: DC
  Administered 2018-05-09: 15:00:00 via INTRAVENOUS

## 2018-05-09 MED ORDER — LIDOCAINE HCL (PF) 1 % IJ SOLN
INTRAMUSCULAR | Status: DC | PRN
  Administered 2018-05-09: 8 mL via EPIDURAL

## 2018-05-09 MED ORDER — PENICILLIN G 3 MILLION UNITS IVPB - SIMPLE MED
3.0000 10*6.[IU] | INTRAVENOUS | Status: DC
  Administered 2018-05-09: 3 10*6.[IU] via INTRAVENOUS
  Filled 2018-05-09: qty 3
  Filled 2018-05-09 (×2): qty 100

## 2018-05-09 MED ORDER — ONDANSETRON HCL 4 MG/2ML IJ SOLN: 4.0000 mg | INTRAMUSCULAR | Status: DC | PRN

## 2018-05-09 MED ORDER — OXYTOCIN BOLUS FROM INFUSION
500.0000 mL | Freq: Once | INTRAVENOUS | Status: AC
  Administered 2018-05-09: 500 mL via INTRAVENOUS

## 2018-05-09 MED ORDER — OXYCODONE-ACETAMINOPHEN 5-325 MG PO TABS: 1.0000 | ORAL_TABLET | ORAL | Status: DC | PRN

## 2018-05-09 MED ORDER — IBUPROFEN 600 MG PO TABS
600.0000 mg | ORAL_TABLET | Freq: Four times a day (QID) | ORAL | Status: DC
  Administered 2018-05-09: 600 mg via ORAL
  Filled 2018-05-09 (×2): qty 1

## 2018-05-09 MED ORDER — SOD CITRATE-CITRIC ACID 500-334 MG/5ML PO SOLN: 30.0000 mL | ORAL | Status: DC | PRN

## 2018-05-09 MED ORDER — PRENATAL MULTIVITAMIN CH: 1.0000 | ORAL_TABLET | Freq: Every day | ORAL | Status: DC

## 2018-05-09 MED ORDER — LIDOCAINE HCL (PF) 1 % IJ SOLN
30.0000 mL | INTRAMUSCULAR | Status: DC | PRN
  Administered 2018-05-09: 30 mL via SUBCUTANEOUS
  Filled 2018-05-09: qty 30

## 2018-05-09 MED ORDER — PHENYLEPHRINE 40 MCG/ML (10ML) SYRINGE FOR IV PUSH (FOR BLOOD PRESSURE SUPPORT)
80.0000 ug | PREFILLED_SYRINGE | INTRAVENOUS | Status: DC | PRN
  Filled 2018-05-09: qty 10
  Filled 2018-05-09: qty 5

## 2018-05-09 MED ORDER — OXYTOCIN 40 UNITS IN LACTATED RINGERS INFUSION - SIMPLE MED: 1.0000 m[IU]/min | INTRAVENOUS | Status: DC

## 2018-05-09 MED ORDER — BENZOCAINE-MENTHOL 20-0.5 % EX AERO: 1.0000 "application " | INHALATION_SPRAY | CUTANEOUS | Status: DC | PRN

## 2018-05-09 MED ORDER — LACTATED RINGERS IV SOLN
500.0000 mL | Freq: Once | INTRAVENOUS | Status: AC
  Administered 2018-05-09: 500 mL via INTRAVENOUS

## 2018-05-09 MED ORDER — ACETAMINOPHEN 325 MG PO TABS: 650.0000 mg | ORAL_TABLET | ORAL | Status: DC | PRN

## 2018-05-09 MED ORDER — ZOLPIDEM TARTRATE 5 MG PO TABS: 5.0000 mg | ORAL_TABLET | Freq: Every evening | ORAL | Status: DC | PRN

## 2018-05-09 MED ORDER — WITCH HAZEL-GLYCERIN EX PADS: 1.0000 "application " | MEDICATED_PAD | CUTANEOUS | Status: DC | PRN

## 2018-05-09 MED ORDER — DIBUCAINE 1 % RE OINT: 1.0000 "application " | TOPICAL_OINTMENT | RECTAL | Status: DC | PRN

## 2018-05-09 NOTE — MAU Note (Signed)
Urine in lab 

## 2018-05-09 NOTE — Anesthesia Procedure Notes (Signed)
Epidural Patient location during procedure: OB Start time: 05/09/2018 7:50 PM  Staffing Anesthesiologist: Bethena Midgetddono, Khristin Keleher, MD  Preanesthetic Checklist Completed: patient identified, site marked, surgical consent, pre-op evaluation, timeout performed, IV checked, risks and benefits discussed and monitors and equipment checked  Epidural Patient position: sitting Prep: site prepped and draped and DuraPrep Patient monitoring: continuous pulse ox and blood pressure Approach: midline Location: L4-L5 Injection technique: LOR air  Needle:  Needle type: Tuohy  Needle gauge: 17 G Needle length: 9 cm and 9 Needle insertion depth: 7 cm Catheter type: closed end flexible Catheter size: 19 Gauge Catheter at skin depth: 12 cm Test dose: negative  Assessment Events: blood not aspirated, injection not painful, no injection resistance, negative IV test and no paresthesia

## 2018-05-09 NOTE — MAU Note (Signed)
?   Leaking first noted yesterday 1000.  Clear grean  With mucous.  Was a lot in the morning, no pad, panties always seem wet. irreg contractions. Was 4+ last time she was checked.

## 2018-05-09 NOTE — MAU Provider Note (Signed)
S: Ms. Franky Machooni Dennen is a 27 y.o. [phone] at 2729w5d  who presents to MAU today complaining of leaking of fluid since yesterday at 1000. She denies vaginal bleeding. She endorses contractions. She reports normal fetal movement.    O: BP 125/82 (BP Location: Left Arm)   Pulse 98   Temp 98.2 F (36.8 C) (Oral)   Resp 16   Wt 87.8 kg   LMP 08/04/2017 (Approximate)   SpO2 99%   BMI 31.23 kg/m  GENERAL: Well-developed, well-nourished female in no acute distress.  HEAD: Normocephalic, atraumatic.  CHEST: Normal effort of breathing, regular heart rate ABDOMEN: Soft, nontender, gravid PELVIC: Normal external female genitalia. Vagina is pink and rugated. Cervix with normal contour, no lesions. Normal discharge.  + moderate pooling/ rust colored fluid.   Cervical exam:   Dilation: 5 Effacement (%): 80 Station: -2 Presentation: Vertex Exam by:: J Rasch NP  Fetal Monitoring: Baseline: 150 bpm Variability: Moderate  Accelerations: 15x15 Decelerations: None Contractions: Occasional   Results for orders placed or performed during the hospital encounter of 05/09/18 (from the past 24 hour(s))  Fern Test     Status: None   Collection Time: 05/09/18  1:10 PM  Result Value Ref Range   POCT Fern Test Negative = intact amniotic membranes     A:  SIUP at 6729w5d  SROM   P:  Admit to BS  GBS +  Venia Carbonasch, Jennifer I, NP 05/09/2018 1:10 PM

## 2018-05-09 NOTE — Anesthesia Pain Management Evaluation Note (Signed)
  CRNA Pain Management Visit Note  Patient: Marissa Contreras, 27 y.o., female  "Hello I am a member of the anesthesia team at [facility]. We have an anesthesia team available at all times to provide care throughout the hospital, including epidural management and anesthesia for C-section. I don't know your plan for the delivery whether it a natural birth, water birth, IV sedation, nitrous supplementation, doula or epidural, but we want to meet your pain goals."   1.Was your pain managed to your expectations on prior hospitalizations?   Yes   2.What is your expectation for pain management during this hospitalization?     Epidural  3.How can we help you reach that goal? Epidural  Record the patient's initial score and the patient's pain goal.   Pain: 2  Pain Goal: 5 [facility] wants you to be able to say your pain was always managed very well.  Rica RecordsICKELTON,Austan Nicholl 05/09/2018

## 2018-05-09 NOTE — H&P (Addendum)
OBSTETRIC ADMISSION HISTORY AND PHYSICAL  Marissa Contreras is a 27 y.o. female [phone] with IUP at 4729w5d by 8 wk U/S presenting for PROM. She reports +FMs, no VB, no blurry vision, headaches or peripheral edema, and RUQ pain.  She plans on breast feeding. She request BTL (consent sign (03/15/2018) for birth control. [facility]   Dating: By 8 wk U/S --->  Estimated Date of Delivery: 05/11/18  Sono:  01/19/2018 @[redacted]w[redacted]d ,  normal anatomy survey except for CI, breech presentation,  N/A g, 76% EFW   Prenatal History/Complications:  Past Medical History: Past Medical History:  Diagnosis Date  . Anxiety   . Asthma   . Chronic back pain   . Depression   . H/O varicella   . Headache   . Menorrhagia 08/19/2016  . UTI (urinary tract infection) 09/09/2017    Past Surgical History: Past Surgical History:  Procedure Laterality Date  . DILATION AND CURETTAGE OF UTERUS    . DILATION AND EVACUATION N/A 03/08/2014   Procedure: DILATATION AND EVACUATION ;  Surgeon: Purcell NailsAngela Y Roberts, MD;  Location: [facility];  Service: Gynecology;  Laterality: N/A;  with chromosomal studies, sent   . TONSILLECTOMY      Obstetrical History: OB History    Gravida  7   Para  2   Term  2   Preterm      AB  4   Living  2     SAB  4   TAB      Ectopic      Multiple  0   Live Births  2           Social History: Social History   Socioeconomic History  . Marital status: Married    Spouse name: tommy robinson  . Number of children: 2  . Years of education: 3012  . Highest education level: 12th grade  Occupational History  . Not on file  Social Needs  . Financial resource strain: Not hard at all  . Food insecurity:    Worry: Never true    Inability: Never true  . Transportation needs:    Medical: Yes    Non-medical: Yes  Tobacco Use  . Smoking status: Former Smoker    Packs/day: 1.00    Types: Cigarettes    Last attempt to quit: 09/24/2017    Years since  quitting: 0.6  . Smokeless tobacco: Never Used  . Tobacco comment: quit for pregnancy  Substance and Sexual Activity  . Alcohol use: No  . Drug use: No  . Sexual activity: Yes    Birth control/protection: None  Lifestyle  . Physical activity:    Days per week: 0 days    Minutes per session: 0 min  . Stress: Rather much  Relationships  . Social connections:    Talks on phone: More than three times a week    Gets together: Never    Attends religious service: Never    Active member of club or organization: No    Attends meetings of clubs or organizations: Never    Relationship status: Married  Other Topics Concern  . Not on file  Social History Narrative   Lives with husband and mother in law.  Husband is 6 years older, works at [employer].  No hobbies, described herself as a "homebody"  Online courses for high school due to difficulty with social stresses.      Family History: Family History  Problem Relation  Age of Onset  . Hypertension Father   . Diabetes Father   . Fibromyalgia Mother   . Arthritis Mother   . Depression Mother   . Diabetes Paternal Grandmother   . Cancer Paternal Grandmother   . Stroke Paternal Grandmother     Allergies: No Known Allergies  Medications Prior to Admission  Medication Sig Dispense Refill Last Dose  . albuterol (PROVENTIL HFA;[name] 108 (90 Base) MCG/ACT inhaler Inhale 2 puffs into the lungs every 6 (six) hours as needed for wheezing or shortness of breath. 1 Inhaler 2 Past Week at Unknown time  . citalopram (CELEXA) 40 MG tablet Take 40 mg by mouth daily.   05/05/2018 at Unknown time  . cyclobenzaprine (FLEXERIL) 10 MG tablet Take 1 tablet (10 mg total) by mouth every 8 (eight) hours as needed for muscle spasms. 30 tablet 1 05/05/2018 at Unknown time  . lidocaine (LIDODERM) 5 % Place 3 patches onto the skin daily. Remove & Discard patch within 12 hours or as directed by MD   05/05/2018 at Unknown time  . ondansetron  (ZOFRAN-ODT) 4 MG disintegrating tablet Take 4 mg by mouth every 8 (eight) hours as needed for nausea or vomiting.   05/05/2018 at Unknown time  . polyethylene glycol powder (GLYCOLAX/MIRALAX) powder Take 17 g by mouth daily. (Patient not taking: Reported on 05/06/2018) 255 g 0 Not Taking at Unknown time  . Prenatal Vit-Fe Fumarate-FA (PRENATAL VITAMIN PLUS LOW IRON) 27-1 MG TABS Take 1 tablet by mouth daily. 30 tablet 11 05/05/2018 at Unknown time  . promethazine (PHENERGAN) 25 MG tablet Take 1 tablet (25 mg total) by mouth every 6 (six) hours as needed for nausea or vomiting. 30 tablet 0 05/05/2018 at Unknown time  . traMADol (ULTRAM) 50 MG tablet Take 1 tablet (50 mg total) by mouth every 6 (six) hours as needed. (Patient taking differently: Take 50 mg by mouth every 6 (six) hours as needed for moderate pain. ) 15 tablet 0 05/05/2018 at Unknown time     Review of Systems   All systems reviewed and negative except as stated in HPI  Blood pressure 125/82, pulse 98, temperature 98.2 F (36.8 C), temperature source Oral, resp. rate 16, weight 87.8 kg, last menstrual period 08/04/2017, SpO2 99 %. General appearance: alert, cooperative, appears stated age and no distress Lungs: clear to auscultation bilaterally Heart: regular rate and rhythm Abdomen: soft, non-tender; bowel sounds normal Pelvic: see below Extremities: Homans sign is negative, no sign of DVT Presentation: unsure Fetal monitoringBaseline: 145 bpm, Variability: Good {> 6 bpm), Accelerations: Reactive and Decelerations: Absent Uterine activity: not intense, 6-7 contractions/hour Dilation: 5 Effacement (%): 80 Station: -2 Exam by:: J Rasch NP   Prenatal labs: ABO, Rh: A/Positive/-- (12/19 1653) Antibody: Negative (12/19 1653) Rubella: 2.34 (12/19 1653) RPR: Non Reactive (05/29 0909)  HBsAg: Negative (12/19 1653)  HIV: Non Reactive (05/29 0909)  GBS:    1 hr Glucola: neg Genetic screening: primary screen: neg, AFP: screen  negative Anatomy US choroid plexus cyst, inadequate views of the heart, FU normal  Prenatal Transfer Tool  Maternal Diabetes: No Genetic Screening: Normal Maternal Ultrasounds/Referrals: Normal Fetal Ultrasounds or other Referrals:  None Maternal Substance Abuse:  No Significant Maternal Medications:  Meds include: Other: Citalopram, albuterol Significant Maternal Lab Results: None  Results for orders placed or performed during the hospital encounter of 05/09/18 (from the past 24 hour(s))  Fern Test   Collection Time: 05/09/18  1:10 PM  Result Value Ref Range  POCT Fern Test Negative = intact amniotic membranes   Fern Test   Collection Time: 05/09/18  1:30 PM  Result Value Ref Range   POCT Fern Test Positive = ruptured amniotic membanes     Patient Active Problem List   Diagnosis Date Noted  . Positive GBS test 04/18/2018  . Migraine 06/03/2017  . Mild intermittent asthma 12/20/2014  . Supervision of other normal pregnancy, antepartum 01/11/2014  . Back pain 02/11/2012    Assessment/Plan:  Marissa Contreras is a 27 y.o. [id number] at 6242w5d here for PROM (8/17 brownish rupture at 10:00 am).  #Labor: progressing well.  Will continue to monitor #Pain: Well controlled now  #FWB: Category 1 tracing #ID:  GBS +, PNC started #MOF: breast #MOC:BTL #Circ:  No circ  Mirian MoPeter Frank, MD  05/09/2018, 1:37 PM  I examined this pt and agree with above assessment

## 2018-05-09 NOTE — Anesthesia Preprocedure Evaluation (Signed)
Anesthesia Evaluation  Patient identified by MRN, date of birth, ID band Patient awake    Reviewed: Allergy & Precautions, H&P , NPO status , Patient's Chart, lab work & pertinent test results, reviewed documented beta blocker date and time   Airway Mallampati: II  TM Distance: >3 FB Neck ROM: full    Dental no notable dental hx.    Pulmonary neg pulmonary ROS, former smoker,    Pulmonary exam normal breath sounds clear to auscultation       Cardiovascular negative cardio ROS Normal cardiovascular exam Rhythm:regular Rate:Normal     Neuro/Psych negative neurological ROS  negative psych ROS   GI/Hepatic negative GI ROS, Neg liver ROS,   Endo/Other  negative endocrine ROS  Renal/GU negative Renal ROS  negative genitourinary   Musculoskeletal   Abdominal   Peds  Hematology negative hematology ROS (+)   Anesthesia Other Findings   Reproductive/Obstetrics (+) Pregnancy                             Anesthesia Physical Anesthesia Plan  ASA: II  Anesthesia Plan: Epidural   Post-op Pain Management:    Induction:   PONV Risk Score and Plan:   Airway Management Planned:   Additional Equipment:   Intra-op Plan:   Post-operative Plan:   Informed Consent: I have reviewed the patients History and Physical, chart, labs and discussed the procedure including the risks, benefits and alternatives for the proposed anesthesia with the patient or authorized representative who has indicated his/her understanding and acceptance.     Plan Discussed with: CRNA, Anesthesiologist and Surgeon  Anesthesia Plan Comments:         Anesthesia Quick Evaluation

## 2018-05-10 ENCOUNTER — Inpatient Hospital Stay (HOSPITAL_COMMUNITY): Payer: Medicaid Other | Admitting: Anesthesiology

## 2018-05-10 ENCOUNTER — Encounter (HOSPITAL_COMMUNITY)
Admission: AD | Disposition: A | Discharge status: Home / Self Care | Payer: Self-pay | Source: Home / Self Care | Attending: Obstetrics and Gynecology

## 2018-05-10 ENCOUNTER — Encounter (HOSPITAL_COMMUNITY): Payer: Self-pay | Admitting: Obstetrics & Gynecology

## 2018-05-10 ENCOUNTER — Encounter: Payer: Medicaid Other | Admitting: Advanced Practice Midwife

## 2018-05-10 DIAGNOSIS — Z302 Encounter for sterilization: Secondary | ICD-10-CM

## 2018-05-10 DIAGNOSIS — O99824 Streptococcus B carrier state complicating childbirth: Secondary | ICD-10-CM

## 2018-05-10 DIAGNOSIS — Z3A39 39 weeks gestation of pregnancy: Secondary | ICD-10-CM

## 2018-05-10 HISTORY — PX: TUBAL LIGATION: SHX77

## 2018-05-10 LAB — RPR: RPR: NONREACTIVE

## 2018-05-10 SURGERY — LIGATION, FALLOPIAN TUBE, POSTPARTUM
Anesthesia: Epidural | Laterality: Bilateral

## 2018-05-10 MED ORDER — ACETAMINOPHEN 325 MG PO TABS
650.0000 mg | ORAL_TABLET | ORAL | Status: DC | PRN
  Administered 2018-05-10: 650 mg via ORAL
  Filled 2018-05-10: qty 2

## 2018-05-10 MED ORDER — OXYCODONE HCL 5 MG PO TABS: 5.0000 mg | ORAL_TABLET | Freq: Once | ORAL | Status: DC | PRN

## 2018-05-10 MED ORDER — OXYCODONE HCL 5 MG PO TABS
10.0000 mg | ORAL_TABLET | ORAL | Status: DC | PRN
  Administered 2018-05-10 – 2018-05-11 (×2): 10 mg via ORAL
  Filled 2018-05-10 (×2): qty 2

## 2018-05-10 MED ORDER — HYDROMORPHONE HCL 1 MG/ML IJ SOLN: 0.2500 mg | INTRAMUSCULAR | Status: DC | PRN

## 2018-05-10 MED ORDER — METOCLOPRAMIDE HCL 10 MG PO TABS
10.0000 mg | ORAL_TABLET | Freq: Once | ORAL | Status: AC
  Administered 2018-05-10: 10 mg via ORAL
  Filled 2018-05-10: qty 1

## 2018-05-10 MED ORDER — BUPIVACAINE HCL (PF) 0.25 % IJ SOLN
INTRAMUSCULAR | Status: AC
  Filled 2018-05-10: qty 20

## 2018-05-10 MED ORDER — BUPIVACAINE IN DEXTROSE 0.75-8.25 % IT SOLN
INTRATHECAL | Status: DC | PRN
  Administered 2018-05-10: 1.6 mL via INTRATHECAL

## 2018-05-10 MED ORDER — FAMOTIDINE 20 MG PO TABS
40.0000 mg | ORAL_TABLET | Freq: Once | ORAL | Status: AC
  Administered 2018-05-10: 40 mg via ORAL
  Filled 2018-05-10: qty 2

## 2018-05-10 MED ORDER — MEASLES, MUMPS & RUBELLA VAC ~~LOC~~ INJ
0.5000 mL | INJECTION | Freq: Once | SUBCUTANEOUS | Status: DC
  Filled 2018-05-10: qty 0.5

## 2018-05-10 MED ORDER — EPHEDRINE SULFATE 50 MG/ML IJ SOLN
INTRAMUSCULAR | Status: DC | PRN
  Administered 2018-05-10: 5 mg via INTRAVENOUS

## 2018-05-10 MED ORDER — OXYCODONE HCL 5 MG PO TABS
5.0000 mg | ORAL_TABLET | ORAL | Status: DC | PRN
  Administered 2018-05-10: 5 mg via ORAL
  Filled 2018-05-10: qty 1

## 2018-05-10 MED ORDER — BUPIVACAINE HCL (PF) 0.25 % IJ SOLN
INTRAMUSCULAR | Status: AC
  Filled 2018-05-10: qty 10

## 2018-05-10 MED ORDER — WITCH HAZEL-GLYCERIN EX PADS: 1.0000 "application " | MEDICATED_PAD | CUTANEOUS | Status: DC | PRN

## 2018-05-10 MED ORDER — SIMETHICONE 80 MG PO CHEW: 80.0000 mg | CHEWABLE_TABLET | ORAL | Status: DC | PRN

## 2018-05-10 MED ORDER — OXYCODONE HCL 5 MG/5ML PO SOLN: 5.0000 mg | Freq: Once | ORAL | Status: DC | PRN

## 2018-05-10 MED ORDER — COCONUT OIL OIL: 1.0000 "application " | TOPICAL_OIL | Status: DC | PRN

## 2018-05-10 MED ORDER — PHENYLEPHRINE HCL 10 MG/ML IJ SOLN
INTRAMUSCULAR | Status: DC | PRN
  Administered 2018-05-10: 80 ug via INTRAVENOUS

## 2018-05-10 MED ORDER — MIDAZOLAM HCL 2 MG/2ML IJ SOLN
INTRAMUSCULAR | Status: AC
  Filled 2018-05-10: qty 2

## 2018-05-10 MED ORDER — FENTANYL CITRATE (PF) 100 MCG/2ML IJ SOLN
INTRAMUSCULAR | Status: DC | PRN
  Administered 2018-05-10: 50 ug via INTRAVENOUS

## 2018-05-10 MED ORDER — FENTANYL CITRATE (PF) 100 MCG/2ML IJ SOLN
INTRAMUSCULAR | Status: AC
  Filled 2018-05-10: qty 2

## 2018-05-10 MED ORDER — ZOLPIDEM TARTRATE 5 MG PO TABS
5.0000 mg | ORAL_TABLET | Freq: Every evening | ORAL | Status: DC | PRN
Start: 2018-05-10 — End: 2018-05-11

## 2018-05-10 MED ORDER — ONDANSETRON HCL 4 MG/2ML IJ SOLN: 4.0000 mg | INTRAMUSCULAR | Status: DC | PRN

## 2018-05-10 MED ORDER — KETOROLAC TROMETHAMINE 30 MG/ML IJ SOLN
30.0000 mg | Freq: Once | INTRAMUSCULAR | Status: AC | PRN
  Administered 2018-05-10: 30 mg via INTRAVENOUS

## 2018-05-10 MED ORDER — PROMETHAZINE HCL 25 MG/ML IJ SOLN: 6.2500 mg | INTRAMUSCULAR | Status: DC | PRN

## 2018-05-10 MED ORDER — GLYCOPYRROLATE 0.2 MG/ML IJ SOLN
INTRAMUSCULAR | Status: DC | PRN
  Administered 2018-05-10: 0.1 mg via INTRAVENOUS

## 2018-05-10 MED ORDER — KETOROLAC TROMETHAMINE 30 MG/ML IJ SOLN
INTRAMUSCULAR | Status: AC
  Filled 2018-05-10: qty 1

## 2018-05-10 MED ORDER — SENNOSIDES-DOCUSATE SODIUM 8.6-50 MG PO TABS
2.0000 | ORAL_TABLET | ORAL | Status: DC
  Filled 2018-05-10: qty 2

## 2018-05-10 MED ORDER — DIPHENHYDRAMINE HCL 25 MG PO CAPS: 25.0000 mg | ORAL_CAPSULE | Freq: Four times a day (QID) | ORAL | Status: DC | PRN

## 2018-05-10 MED ORDER — ONDANSETRON HCL 4 MG PO TABS
4.0000 mg | ORAL_TABLET | ORAL | Status: DC | PRN
  Administered 2018-05-11: 4 mg via ORAL
  Filled 2018-05-10: qty 1

## 2018-05-10 MED ORDER — LACTATED RINGERS IV SOLN
INTRAVENOUS | Status: DC
  Administered 2018-05-10: 10:00:00 via INTRAVENOUS

## 2018-05-10 MED ORDER — IBUPROFEN 600 MG PO TABS
600.0000 mg | ORAL_TABLET | Freq: Four times a day (QID) | ORAL | Status: DC
  Administered 2018-05-10 – 2018-05-11 (×2): 600 mg via ORAL
  Filled 2018-05-10 (×3): qty 1

## 2018-05-10 MED ORDER — EPHEDRINE 5 MG/ML INJ
INTRAVENOUS | Status: AC
  Filled 2018-05-10: qty 10

## 2018-05-10 MED ORDER — DIBUCAINE 1 % RE OINT: 1.0000 "application " | TOPICAL_OINTMENT | RECTAL | Status: DC | PRN

## 2018-05-10 MED ORDER — MIDAZOLAM HCL 2 MG/2ML IJ SOLN
INTRAMUSCULAR | Status: DC | PRN
  Administered 2018-05-10: 2 mg via INTRAVENOUS

## 2018-05-10 MED ORDER — LACTATED RINGERS IV SOLN: INTRAVENOUS | Status: DC

## 2018-05-10 MED ORDER — PHENYLEPHRINE 40 MCG/ML (10ML) SYRINGE FOR IV PUSH (FOR BLOOD PRESSURE SUPPORT)
PREFILLED_SYRINGE | INTRAVENOUS | Status: AC
  Filled 2018-05-10: qty 10

## 2018-05-10 MED ORDER — BENZOCAINE-MENTHOL 20-0.5 % EX AERO: 1.0000 "application " | INHALATION_SPRAY | CUTANEOUS | Status: DC | PRN

## 2018-05-10 MED ORDER — TETANUS-DIPHTH-ACELL PERTUSSIS 5-2.5-18.5 LF-MCG/0.5 IM SUSP: 0.5000 mL | Freq: Once | INTRAMUSCULAR | Status: DC

## 2018-05-10 MED ORDER — PRENATAL MULTIVITAMIN CH
1.0000 | ORAL_TABLET | Freq: Every day | ORAL | Status: DC
  Administered 2018-05-11: 1 via ORAL
  Filled 2018-05-10: qty 1

## 2018-05-10 SURGICAL SUPPLY — 23 items
BLADE SURG 11 STRL SS (BLADE) ×3 IMPLANT
CLIP FILSHIE TUBAL LIGA STRL (Clip) ×3 IMPLANT
CLOTH BEACON ORANGE TIMEOUT ST (SAFETY) ×3 IMPLANT
DRSG OPSITE POSTOP 3X4 (GAUZE/BANDAGES/DRESSINGS) ×3 IMPLANT
DRSG OPSITE POSTOP 4X8 (GAUZE/BANDAGES/DRESSINGS) ×3 IMPLANT
DURAPREP 26ML APPLICATOR (WOUND CARE) ×3 IMPLANT
GLOVE BIO SURGEON STRL SZ 6.5 (GLOVE) ×2 IMPLANT
GLOVE BIO SURGEONS STRL SZ 6.5 (GLOVE) ×1
GLOVE BIOGEL PI IND STRL 7.0 (GLOVE) ×2 IMPLANT
GLOVE BIOGEL PI INDICATOR 7.0 (GLOVE) ×4
GOWN STRL REUS W/TWL LRG LVL3 (GOWN DISPOSABLE) ×6 IMPLANT
NEEDLE HYPO 22GX1.5 SAFETY (NEEDLE) ×3 IMPLANT
NS IRRIG 1000ML POUR BTL (IV SOLUTION) ×3 IMPLANT
PACK ABDOMINAL MINOR (CUSTOM PROCEDURE TRAY) ×3 IMPLANT
PROTECTOR NERVE ULNAR (MISCELLANEOUS) ×3 IMPLANT
SPONGE LAP 4X18 X RAY DECT (DISPOSABLE) IMPLANT
SUT VIC AB 0 CT1 27 (SUTURE) ×2
SUT VIC AB 0 CT1 27XBRD ANBCTR (SUTURE) ×1 IMPLANT
SUT VICRYL 4-0 PS2 18IN ABS (SUTURE) ×3 IMPLANT
SYR CONTROL 10ML LL (SYRINGE) ×3 IMPLANT
TOWEL OR 17X24 6PK STRL BLUE (TOWEL DISPOSABLE) ×6 IMPLANT
TRAY FOLEY CATH SILVER 16FR (SET/KITS/TRAYS/PACK) ×3 IMPLANT
WATER STERILE IRR 1000ML POUR (IV SOLUTION) ×3 IMPLANT

## 2018-05-10 NOTE — Anesthesia Procedure Notes (Signed)
Spinal  Patient location during procedure: OR Start time: 05/10/2018 10:02 AM End time: 05/10/2018 10:07 AM Staffing Anesthesiologist: Lowella CurbMiller, Malaya Cagley Ray, MD Performed: anesthesiologist  Preanesthetic Checklist Completed: patient identified, surgical consent, pre-op evaluation, timeout performed, IV checked, risks and benefits discussed and monitors and equipment checked Spinal Block Patient position: sitting Prep: site prepped and draped and DuraPrep Patient monitoring: heart rate, cardiac monitor, continuous pulse ox and blood pressure Approach: midline Location: L3-4 Injection technique: single-shot Needle Needle type: Pencan  Needle gauge: 24 G Needle length: 10 cm Assessment Sensory level: T4

## 2018-05-10 NOTE — Anesthesia Postprocedure Evaluation (Addendum)
Anesthesia Post Note  Patient: Marissa Contreras  Procedure(s) Performed: POST PARTUM TUBAL LIGATION (Bilateral )     Patient location during evaluation: PACU Anesthesia Type: Spinal Level of consciousness: oriented and awake and alert Pain management: pain level controlled Vital Signs Assessment: post-procedure vital signs reviewed and stable Respiratory status: spontaneous breathing and respiratory function stable Cardiovascular status: blood pressure returned to baseline and stable Postop Assessment: no headache, no backache and no apparent nausea or vomiting Anesthetic complications: no    Last Vitals:  Vitals:   05/10/18 1230 05/10/18 1234  BP: 119/72   Pulse: (!) 53 (!) 53  Resp: (!) 21 14  Temp:    SpO2: 100% 100%    Last Pain:  Vitals:   05/10/18 1234  TempSrc:   PainSc: 4    Pain Goal:                 Lowella CurbWarren Ray Miller

## 2018-05-10 NOTE — Transfer of Care (Signed)
Immediate Anesthesia Transfer of Care Note  Patient: Marissa Contreras  Procedure(s) Performed: POST PARTUM TUBAL LIGATION (Bilateral )  Patient Location: PACU  Anesthesia Type:Spinal  Level of Consciousness: awake, alert  and oriented  Airway & Oxygen Therapy: Patient Spontanous Breathing  Post-op Assessment: Report given to RN and Post -op Vital signs reviewed and stable  Post vital signs: Reviewed and stable  Last Vitals:  Vitals Value Taken Time  BP 104/67 05/10/2018 10:55 AM  Temp    Pulse 66 05/10/2018 10:55 AM  Resp    SpO2 99 % 05/10/2018 10:55 AM  Vitals shown include unvalidated device data.  Last Pain:  Vitals:   05/10/18 0730  TempSrc: Oral  PainSc: 5          Complications: No apparent anesthesia complications

## 2018-05-10 NOTE — Lactation Note (Signed)
This note was copied from a baby's chart. Lactation Consultation Note  Patient Name: Marissa Contreras ZOXWR'UToday's Date: 05/10/2018 Reason for consult: Initial assessment;Term  Experienced breast feeding mother of 2  Baby is 18 hours, 3 % weight loss, LC reviewed doc flow sheets - 3 wets, 4 stools, and one  Of the wets was at the consult( large ) . LC showed mom.  Baby awake and hungry, LC offered to assist and mom receptive,  LC 1st had mom massage breast, and hand express, ( which was  Very easy for mom) several drops. Due to areola semi compressible,  Slightly inverted on the inverted aspect,easily fixed with reverse pressure  And nipple was more erect. LC worked with mom to obtain the depth with  Breast compressions. Baby fed for 17 mins , with multiple swallows,  And increased with breast compressions. ( mom aware of  How to do the breast  Compressions, not the reverse pressure. Mom was surprised how easy and  How well the reverse pressure made the nipple come out more,  Per mom used shells in the past, and also asked for hand pump.  LC instructed on the use shells between feedings except when sleeping, and when not STS.  Also instructed mom on the use hand pump for home use. Due to mom breast feeding did not check flange. Mom had mentioned she had used the #21 flange in the past and it worked well.  LC recommended since the hand pump is not needed presently for latch, the flange size can be checked before D/C .  [facility]  Mother informed of post-discharge support and given phone number to the lactation department, including services for phone call assistance; out-patient appointments; and breastfeeding support group. List of other breastfeeding resources in the community given in the handout. Encouraged mother to call for problems or concerns related to breastfeeding.     Maternal Data Has patient been taught Hand Expression?: Yes(mom able to return  demostrated ) Does the patient have breastfeeding experience prior to this delivery?: Yes  Feeding Feeding Type: Breast Fed Length of feed: (swallows noted )  LATCH Score Latch: Grasps breast easily, tongue down, lips flanged, rhythmical sucking.  Audible Swallowing: Spontaneous and intermittent  Type of Nipple: Everted at rest and after stimulation  Comfort (Breast/Nipple): Soft / non-tender  Hold (Positioning): Assistance needed to correctly position infant at breast and maintain latch.  LATCH Score: 9  Interventions Interventions: Breast feeding basics reviewed;Assisted with latch;Skin to skin;Breast massage;Hand express;Breast compression;Adjust position;Support pillows;Position options;Shells;Hand pump  Lactation Tools Discussed/Used Tools: Shells;Pump Shell Type: Inverted Breast pump type: Manual WIC Program: Yes Pump Review: Setup, frequency, and cleaning Initiated by:: MAI  Date initiated:: 05/10/18   Consult Status Consult Status: Follow-up Date: 05/11/18 Follow-up type: In-patient    Matilde SprangMargaret Ann Karlena Luebke 05/10/2018, 2:43 PM

## 2018-05-10 NOTE — Addendum Note (Signed)
Addendum  created 05/10/18 1347 by Elgie CongoMalinova, Lyndy Russman H, CRNA   Sign clinical note

## 2018-05-10 NOTE — Anesthesia Preprocedure Evaluation (Signed)
Anesthesia Evaluation  Patient identified by MRN, date of birth, ID band Patient awake    Reviewed: Allergy & Precautions, H&P , NPO status , Patient's Chart, lab work & pertinent test results, reviewed documented beta blocker date and time   Airway Mallampati: II  TM Distance: >3 FB Neck ROM: full    Dental no notable dental hx.    Pulmonary neg pulmonary ROS, former smoker,    Pulmonary exam normal breath sounds clear to auscultation       Cardiovascular negative cardio ROS Normal cardiovascular exam Rhythm:regular Rate:Normal     Neuro/Psych negative neurological ROS  negative psych ROS   GI/Hepatic negative GI ROS, Neg liver ROS,   Endo/Other  negative endocrine ROS  Renal/GU negative Renal ROS  negative genitourinary   Musculoskeletal   Abdominal   Peds  Hematology negative hematology ROS (+)   Anesthesia Other Findings   Reproductive/Obstetrics                             Anesthesia Physical  Anesthesia Plan  ASA: II  Anesthesia Plan: Epidural   Post-op Pain Management:    Induction:   PONV Risk Score and Plan: 2 and Treatment may vary due to age or medical condition  Airway Management Planned: Simple Face Mask  Additional Equipment:   Intra-op Plan:   Post-operative Plan:   Informed Consent: I have reviewed the patients History and Physical, chart, labs and discussed the procedure including the risks, benefits and alternatives for the proposed anesthesia with the patient or authorized representative who has indicated his/her understanding and acceptance.     Plan Discussed with: CRNA, Anesthesiologist and Surgeon  Anesthesia Plan Comments:         Anesthesia Quick Evaluation

## 2018-05-10 NOTE — Op Note (Addendum)
Marissa Contreras 05/09/2018 - 05/10/2018  PREOPERATIVE DIAGNOSIS:  Undesired fertility  POSTOPERATIVE DIAGNOSIS:  Undesired fertility  PROCEDURE:  Postpartum Bilateral Tubal Sterilization using Filshie Clips   SURGEON: Surgeon(s) and Role:    * Adam PhenixArnold, James G, MD - Primary    * Arvilla MarketWallace, Catherine Lauren, DO - Assisting -OB Fellow          ANESTHESIA:  Epidural  COMPLICATIONS:  None immediate.  ESTIMATED BLOOD LOSS:  Minimal  FLUIDS: 700 cc LR.  URINE OUTPUT:  100 cc of clear urine.  INDICATIONS: 27 y.o. yo [phone]  with undesired fertility,status post vaginal delivery, desires permanent sterilization. Risks and benefits of procedure discussed with patient including permanence of method, bleeding, infection, injury to surrounding organs and need for additional procedures. Risk failure of 0.5-1% with increased risk of ectopic gestation if pregnancy occurs was also discussed with patient.   FINDINGS:  Normal uterus, tubes, and ovaries.  TECHNIQUE:  The patient was taken to the operating room where her epidural anesthesia was dosed up to surgical level and found to be adequate.  She was then placed in the dorsal supine position and prepped and draped in sterile fashion.  After an adequate timeout was performed, attention was turned to the patient's abdomen where 8 cc of 1/4 Marcaine was injected into the umbilicus and a small transverse skin incision was made under the umbilical fold. The incision was taken down to the layer of fascia using the scalpel, and fascia was incised, and extended bilaterally using Mayo scissors. The peritoneum was entered in a sharp fashion. Attention was then turned to the patient's uterus, and right fallopian tube was identified and followed out to the fimbriated end.  A Filshie clip was placed on the right fallopian tube about 2 cm from the cornual attachment, with care given to incorporate the underlying mesosalpinx.  A similar process was carried out on the left side  allowing for bilateral tubal sterilization.  Good hemostasis was noted overall.  Local analgesia was drizzled on both operative sites.The instruments were then removed from the patient's abdomen and the fascial incision was repaired with 0 Vicryl, and the skin was closed with a 3-0 Monocryl subcuticular stitch. The patient tolerated the procedure well.  Sponge, lap, and needle counts were correct times two.  The patient was then taken to the recovery room awake, extubated and in stable condition.  Marcy Sirenatherine Wallace, D.O. OB FELLOW  05/10/2018, 10:50 AM

## 2018-05-10 NOTE — Anesthesia Postprocedure Evaluation (Signed)
Anesthesia Post Note  Patient: Marissa Contreras  Procedure(s) Performed: POST PARTUM TUBAL LIGATION (Bilateral )     Patient location during evaluation: Mother Baby Anesthesia Type: Spinal Level of consciousness: awake and alert Pain management: pain level controlled Vital Signs Assessment: post-procedure vital signs reviewed and stable Respiratory status: spontaneous breathing, nonlabored ventilation and respiratory function stable Cardiovascular status: stable Postop Assessment: no headache, no backache, spinal receding, patient able to bend at knees, no apparent nausea or vomiting, able to ambulate and adequate PO intake Anesthetic complications: no    Last Vitals:  Vitals:   05/10/18 1234 05/10/18 1250  BP:  117/72  Pulse: (!) 53 (!) 53  Resp: 14 16  Temp:  (!) 36.3 C  SpO2: 100% 100%    Last Pain:  Vitals:   05/10/18 1300  TempSrc:   PainSc: 5    Pain Goal:                 Land O'LakesMalinova,Anita Mcadory Hristova

## 2018-05-10 NOTE — Anesthesia Postprocedure Evaluation (Signed)
Anesthesia Post Note  Patient: Marissa Contreras  Procedure(s) Performed: AN AD HOC LABOR EPIDURAL     Patient location during evaluation: Mother Baby Anesthesia Type: Epidural Level of consciousness: awake and alert Pain management: pain level controlled Vital Signs Assessment: post-procedure vital signs reviewed and stable Respiratory status: spontaneous breathing, nonlabored ventilation and respiratory function stable Cardiovascular status: stable Postop Assessment: no headache, no backache, epidural receding, patient able to bend at knees, no apparent nausea or vomiting, able to ambulate and adequate PO intake Anesthetic complications: no    Last Vitals:  Vitals:   05/09/18 2345 05/10/18 0345  BP: 119/80 111/63  Pulse: 87 69  Resp: 18 18  Temp: 36.6 C 36.8 C  SpO2:      Last Pain:  Vitals:   05/10/18 0550  TempSrc:   PainSc: 0-No pain   Pain Goal:                 Land O'LakesMalinova,Josselin Gaulin Hristova

## 2018-05-10 NOTE — Plan of Care (Signed)
Progressing appropriately. Encouraged to call for assistance as needed, and for [facility] assessment. Safety information and room orientation completed.

## 2018-05-10 NOTE — Anesthesia Procedure Notes (Signed)
Date/Time: 05/10/2018 10:07 AM Performed by: Armanda HeritageSterling, Adhya Cocco M, CRNA Oxygen Delivery Method: Nasal cannula

## 2018-05-10 NOTE — Progress Notes (Addendum)
Post Partum Day 1 Subjective: no complaints, up ad lib, voiding and + flatus. Denies dizziness, RUQ pain, heavy vaginal bleeding, vision changes, headache, or other complaints. Feels ready for BTL this morning at 0930.  Objective: Blood pressure 111/63, pulse 69, temperature 98.2 F (36.8 C), temperature source Oral, resp. rate 18, weight 87.8 kg, last menstrual period 08/04/2017, SpO2 98 %, unknown if currently breastfeeding.   Hemoglobin & Hematocrit     Component Value Date/Time   HGB 10.4 (L) 05/09/2018 1419   HGB 10.7 (L) 02/17/2018 0909   HCT 32.6 (L) 05/09/2018 1419   HCT 33.2 (L) 02/17/2018 0909     Physical Exam:  General: alert, cooperative and no distress Lochia: appropriate Uterine Fundus: firm DVT Evaluation: No cords or calf tenderness. No significant calf/ankle edema.  Recent Labs    05/09/18 1419  HGB 10.4*  HCT 32.6*    Assessment/Plan: BTL @ 0930   LOS: 1 day   Margarita RanaHarrison D Rose 05/10/2018, 7:35 AM   Attestation of Attending Supervision of Student: Evaluation and management procedures were performed by the student under my supervision and collaboration.  I have reviewed the student's note and chart, examined the patient, counseled her, and I agree with the management and plan.  27 y.o. [id number] with undesired fertility,status post vaginal delivery, desires permanent sterilization. Risks and benefits of procedure discussed with patient including permanence of method, bleeding, infection, injury to surrounding organs and need for additional procedures. Risk failure of 0.5-1% with increased risk of ectopic gestation if pregnancy occurs was also discussed with patient. Patient verbalized understanding and all questions were answered.      Scheryl DarterJAMES ARNOLD, MD, FACOG Attending Obstetrician & Gynecologist Faculty Practice, [facility] [place]

## 2018-05-10 NOTE — Anesthesia Postprocedure Evaluation (Signed)
Anesthesia Post Note  Patient: Marissa Contreras  Procedure(s) Performed: AN AD HOC LABOR EPIDURAL     Patient location during evaluation: Mother Baby Anesthesia Type: Epidural Level of consciousness: awake and alert Pain management: pain level controlled Vital Signs Assessment: post-procedure vital signs reviewed and stable Respiratory status: spontaneous breathing, nonlabored ventilation and respiratory function stable Cardiovascular status: stable Postop Assessment: no headache, no backache and epidural receding Anesthetic complications: no    Last Vitals:  Vitals:   05/09/18 2245 05/09/18 2345  BP: 121/73 119/80  Pulse: 81 87  Resp: 18 18  Temp: 36.7 C 36.6 C  SpO2: 98%     Last Pain:  Vitals:   05/09/18 2345  TempSrc: Oral  PainSc: 6    Pain Goal:                 Marissa Contreras

## 2018-05-10 NOTE — Addendum Note (Signed)
Addendum  created 05/10/18 0825 by Elgie CongoMalinova, Annaleigh Steinmeyer H, CRNA   Charge Capture section accepted, Sign clinical note

## 2018-05-10 NOTE — Progress Notes (Signed)
MOB was referred for history of depression/anxiety. * Referral screened out by Clinical Social Worker because none of the following criteria appear to apply: ~ History of anxiety/depression during this pregnancy, or of post-partum depression following prior delivery. ~ Diagnosis of anxiety and/or depression within last 3 years OR * MOB's symptoms currently being treated with medication and/or therapy. MOB has Rx for Celexa.  Resources information was provided to MOB by [facility]; MOB plans to follow-up postpartum.  Please contact the Clinical Social Worker if needs arise, by [facility], or if MOB scores greater than 9/yes to question 10 on Edinburgh Postpartum Depression Screen.  Blaine HamperAngel Boyd-Gilyard, MSW, LCSW Clinical Social Work [phone]

## 2018-05-10 NOTE — Progress Notes (Signed)
POSTPARTUM PROGRESS NOTE  Post Partum Day 1  Subjective:  Marissa Contreras is a 27 y.o. [id number] s/p SVD at 3963w5d.  She reports she is doing well. No acute events overnight. She denies any problems with ambulating, voiding or po intake. Denies nausea or vomiting.  Pain is well controlled.  Lochia is appropriatte.  Objective: Blood pressure 119/80, pulse 60, temperature 97.7 F (36.5 C), temperature source Oral, resp. rate 14, weight 87.8 kg, last menstrual period 08/04/2017, SpO2 100 %, unknown if currently breastfeeding.  Physical Exam:  General: alert, cooperative and no distress Chest: no respiratory distress Heart:regular rate, distal pulses intact Abdomen: soft, nontender,  Uterine Fundus: firm, appropriately tender DVT Evaluation: No calf swelling or tenderness Extremities: No edema Skin: warm, dry  Recent Labs    05/09/18 1419  HGB 10.4*  HCT 32.6*    Assessment/Plan: Marissa Machooni Herrada is a 27 y.o. [id number] s/p SVD at 5263w5d   PPD#1 - Doing well  Routine postpartum care Contraception: BTL today.  Feeding: breast Dispo: Plan for discharge tomorrow.   LOS: 1 day   Marcy Sirenatherine Sorin Frimpong, D.O. OB Fellow  05/10/2018, 11:45 AM

## 2018-05-11 ENCOUNTER — Encounter (HOSPITAL_COMMUNITY): Payer: Self-pay

## 2018-05-11 MED ORDER — WITCH HAZEL-GLYCERIN EX PADS: 1.0000 "application " | MEDICATED_PAD | CUTANEOUS | 12 refills | Status: DC | PRN

## 2018-05-11 MED ORDER — OXYCODONE HCL 5 MG PO TABS: 5.0000 mg | ORAL_TABLET | ORAL | 0 refills | Status: AC | PRN

## 2018-05-11 MED ORDER — IBUPROFEN 600 MG PO TABS: 600.0000 mg | ORAL_TABLET | Freq: Four times a day (QID) | ORAL | 0 refills | Status: DC

## 2018-05-11 MED ORDER — ACETAMINOPHEN 325 MG PO TABS: 650.0000 mg | ORAL_TABLET | ORAL | 0 refills | Status: DC | PRN

## 2018-05-11 MED ORDER — DIBUCAINE 1 % RE OINT: 1.0000 "application " | TOPICAL_OINTMENT | RECTAL | 0 refills | Status: DC | PRN

## 2018-05-11 NOTE — Discharge Summary (Signed)
OB Discharge Summary     Patient Name: Marissa Contreras Height DOB: 02-20-1991 MRN: [id number]  Date of admission: 05/09/2018 Delivering MD: Tamera StandsWALLACE, LAUREL S   Date of discharge: 05/11/2018  Admitting diagnosis: 7739 WKS, DISCHARGE Intrauterine pregnancy: 6664w5d     Secondary diagnosis:  Principal Problem:   PROM (premature rupture of membranes) Active Problems:   Vaginal delivery BTL 05/10/2018 Additional problems: N/A     Discharge diagnosis: Term Pregnancy Delivered and s/p BTL                                                                                                Post partum procedures:BTL  Augmentation: Pitocin  Complications: None  Hospital course:  Onset of Labor With Vaginal Delivery     27 y.o. yo [id number] at 5864w5d was admitted in Active Labor on 05/09/2018. Patient had an uncomplicated labor course as follows:  Membrane Rupture Time/Date: 10:00 AM ,05/09/2018   Intrapartum Procedures: Episiotomy: None [1]                                         Lacerations:  Labial [10]  Patient had a delivery of a Viable infant. 05/09/2018  Information for the patient's newborn:  Bedelia Personruitt, Boy Kimiye [[id number]][[id number]]  Delivery Method: Vaginal, Spontaneous(Filed from Delivery Summary)    Pateint had an uncomplicated postpartum course.  She is ambulating, tolerating a regular diet, passing flatus, and urinating well. Patient is discharged home in stable condition on 05/11/18.   Physical exam  Vitals:   05/10/18 1234 05/10/18 1250 05/10/18 1355 05/10/18 2133  BP:  117/72 121/65 113/71  Pulse: (!) 53 (!) 53 73 90  Resp: 14 16  20   Temp:  (!) 97.3 F (36.3 C) 98 F (36.7 C) 97.7 F (36.5 C)  TempSrc:  Oral Oral Oral  SpO2: 100% 100%    Weight:       General: alert, cooperative and no distress Lochia: appropriate Uterine Fundus: firm Incision: Dressing is clean, dry, and intact DVT Evaluation: No evidence of DVT seen on physical exam. Labs: Lab Results  Component Value Date   WBC  10.5 05/09/2018   HGB 10.4 (L) 05/09/2018   HCT 32.6 (L) 05/09/2018   MCV 78.6 05/09/2018   PLT 184 05/09/2018   CMP Latest Ref Rng & Units 08/08/2014  Glucose 70 - 99 mg/dL 528(U113(H)  BUN 6 - 23 mg/dL 13  Creatinine 1.320.50 - 4.401.10 mg/dL 1.020.58  Sodium 725137 - 366147 mEq/L 137  Potassium 3.7 - 5.3 mEq/L 3.9  Chloride 96 - 112 mEq/L 103  CO2 19 - 32 mEq/L 24  Calcium 8.4 - 10.5 mg/dL 9.5  Total Protein 6.0 - 8.3 g/dL -  Total Bilirubin 0.3 - 1.2 mg/dL -  Alkaline Phos 39 - 440117 U/L -  AST 0 - 37 U/L -  ALT 0 - 35 U/L -    Discharge instruction: per After Visit Summary and "Baby and Me Booklet".  After visit meds:  Allergies as of 05/11/2018  No Known Allergies     Medication List    STOP taking these medications   cyclobenzaprine 10 MG tablet Commonly known as:  FLEXERIL   lidocaine 5 % Commonly known as:  LIDODERM   ondansetron 4 MG disintegrating tablet Commonly known as:  ZOFRAN-ODT   promethazine 25 MG tablet Commonly known as:  PHENERGAN   traMADol 50 MG tablet Commonly known as:  ULTRAM     TAKE these medications   acetaminophen 325 MG tablet Commonly known as:  TYLENOL Take 2 tablets (650 mg total) by mouth every 4 (four) hours as needed (for pain scale < 4).   albuterol 108 (90 Base) MCG/ACT inhaler Commonly known as:  PROVENTIL HFA;VENTOLIN HFA Inhale 2 puffs into the lungs every 6 (six) hours as needed for wheezing or shortness of breath.   citalopram 40 MG tablet Commonly known as:  CELEXA Take 40 mg by mouth daily.   dibucaine 1 % Oint Commonly known as:  NUPERCAINAL Place 1 application rectally as needed for hemorrhoids.   ibuprofen 600 MG tablet Commonly known as:  ADVIL,MOTRIN Take 1 tablet (600 mg total) by mouth every 6 (six) hours.   oxyCODONE 5 MG immediate release tablet Commonly known as:  Oxy IR/ROXICODONE Take 1 tablet (5 mg total) by mouth every 4 (four) hours as needed for up to 5 days for moderate pain (pain scale 4-7).    polyethylene glycol powder powder Commonly known as:  GLYCOLAX/MIRALAX Take 17 g by mouth daily.   PRENATAL VITAMIN PLUS LOW IRON 27-1 MG Tabs Take 1 tablet by mouth daily.   witch hazel-glycerin pad Commonly known as:  TUCKS Apply 1 application topically as needed for hemorrhoids.       Diet: routine diet  Activity: Advance as tolerated. Pelvic rest for 6 weeks.   Outpatient follow up:2 weeks to check BTL incision Follow up Appt: Future Appointments  Date Time Provider Department Center  06/21/2018  1:55 PM Armando ReichertHogan, Heather D, CNM [facility]   Follow up Visit:No follow-ups on file.  Postpartum contraception: Tubal Ligation  Newborn Data: Live born female  Birth Weight: 9 lb 1 oz (4110 g) APGAR: 9, 9  Newborn Delivery   Birth date/time:  05/09/2018 20:32:00 Delivery type:  Vaginal, Spontaneous     Baby Feeding: Breast Disposition:home with mother   05/11/2018 Calvert CantorSamantha C Ragna Kramlich, CNM

## 2018-05-11 NOTE — Progress Notes (Signed)
Patient scored 12 on Edinburgh. Social work informed. Social work offered, patient refused. Patient stated she feels controlled on medication and will contact doctor if she feels she needs to.

## 2018-05-11 NOTE — Progress Notes (Addendum)
Post Partum Day 2 Post-BTL Day 1 Subjective: up ad lib, voiding and + flatus. Some tenderness to incision site. No other complaints.  Objective: Blood pressure 113/71, pulse 90, temperature 97.7 F (36.5 C), temperature source Oral, resp. rate 20, weight 87.8 kg, last menstrual period 08/04/2017, SpO2 100 %, unknown if currently breastfeeding.  Physical Exam:  General: alert, cooperative and no distress Lochia: appropriate Uterine Fundus: firm Incision: no significant drainage, no significant erythema DVT Evaluation: No cords or calf tenderness. No significant calf/ankle edema.  Recent Labs    05/09/18 1419  HGB 10.4*  HCT 32.6*    Assessment/Plan: Discharge home   LOS: 2 days   Margarita RanaHarrison D Rose 05/11/2018, 7:51 AM   I confirm that I have verified the information documented in the PA student's's note and that I have also personally performed the physical exam and all medical decision making activities.  Clayton BiblesSamantha Eliasar Hlavaty, CNM 05/11/18  5:16 PM

## 2018-05-11 NOTE — Plan of Care (Signed)
Progressing appropriately. Encouraged to call for assistance as needed, and for LATCH assessment.  

## 2018-05-11 NOTE — Lactation Note (Signed)
This note was copied from a baby's chart. Lactation Consultation Note  Patient Name: Marissa Contreras ZOXWR'UToday's Date: 05/11/2018 Reason for consult: Follow-up assessment;Nipple pain/trauma Mom is becoming sore.  She states baby doesn't open wide making a deep latch challenging.  Instructed to wake baby well and hand express prior to feeding.  Comfort gels provided.  Mom would like a 21 mm flange for hand pump.  Lactation outpatient services and support information reviewed and encouraged prn.   Maternal Data    Feeding Feeding Type: Breast Fed Length of feed: 8 min  LATCH Score                   Interventions    Lactation Tools Discussed/Used     Consult Status Consult Status: Complete Date: 05/11/18 Follow-up type: Call as needed    Huston FoleyMOULDEN, Shray Hunley S 05/11/2018, 10:15 AM

## 2018-05-11 NOTE — Discharge Instructions (Signed)

## 2018-05-13 ENCOUNTER — Other Ambulatory Visit: Payer: Self-pay | Admitting: Family Medicine

## 2018-05-13 MED ORDER — GABAPENTIN 100 MG PO CAPS: 100.0000 mg | ORAL_CAPSULE | Freq: Three times a day (TID) | ORAL | 3 refills | Status: DC

## 2018-05-13 NOTE — Addendum Note (Signed)
Addendum  created 05/13/18 0850 by Lowella CurbMiller, Genevra Orne Ray, MD   Sign clinical note

## 2018-05-13 NOTE — Progress Notes (Signed)
Gabapentin for neuropathic pain She had stopped using while pregnant. Looked up risk factors during lactation and discussed with mom over the phone and she understands it does get transferred via breast milk but considered relatively safe.

## 2018-05-17 ENCOUNTER — Encounter: Payer: Medicaid Other | Admitting: Advanced Practice Midwife

## 2018-05-25 ENCOUNTER — Ambulatory Visit (INDEPENDENT_AMBULATORY_CARE_PROVIDER_SITE_OTHER): Payer: Medicaid Other | Admitting: Advanced Practice Midwife

## 2018-05-25 ENCOUNTER — Encounter: Payer: Self-pay | Admitting: Advanced Practice Midwife

## 2018-05-25 VITALS — BP 131/80 | HR 72 | Wt 173.1 lb

## 2018-05-25 DIAGNOSIS — N6311 Unspecified lump in the right breast, upper outer quadrant: Secondary | ICD-10-CM | POA: Diagnosis not present

## 2018-05-25 NOTE — Progress Notes (Signed)
  Subjective:     Patient ID: Marissa Contreras, female   DOB: [birth year], 27 y.o.   MRN: [id number]  Marissa Contreras is a 27 y.o. [id number] who is apprx 2 weeks S/P NSVD. She is here today with breast lump in her right breast. She states that it has been there since before she had the baby, but she kept forgetting to mention it at her appts. She states that it is firm, and has increased in since since the birth. She reports that breastfeeding is going well and the baby is nursing from both breasts.     Review of Systems  Constitutional: Negative for chills and fever.  Skin: Negative for color change and rash.       Objective:   Physical Exam  Constitutional: She is oriented to person, place, and time. She appears well-developed and well-nourished. No distress.  Pulmonary/Chest: Effort normal. Right breast exhibits mass. Right breast exhibits no inverted nipple, no skin change and no tenderness. Left breast exhibits no inverted nipple, no mass, no nipple discharge, no skin change and no tenderness.  Abdominal: Soft.  Neurological: She is alert and oriented to person, place, and time.  Skin: Skin is warm and dry.  Psychiatric: She has a normal mood and affect.  Nursing note and vitals reviewed.  3cmx3cm firm, mobile mass at the  10:00 position on the right breast.     Assessment:     1. Breast lump on right side at 10 o'clock position        Plan:     Breast US complete FU with breast US results

## 2018-05-27 ENCOUNTER — Telehealth: Payer: Self-pay | Admitting: Advanced Practice Midwife

## 2018-05-27 NOTE — Telephone Encounter (Signed)
Patient is calling because she was suppose to get an ultrasound scheduled, and has not heard from anyone yet.

## 2018-05-28 NOTE — Telephone Encounter (Signed)
Returned pt call and informed her of her appointment for Ultrasound on 06/03/18 @ 1240 at [facility].  Pt verbalized understanding and had no questions.

## 2018-06-02 ENCOUNTER — Telehealth: Payer: Self-pay | Admitting: Family Medicine

## 2018-06-02 ENCOUNTER — Ambulatory Visit (INDEPENDENT_AMBULATORY_CARE_PROVIDER_SITE_OTHER): Payer: Medicaid Other | Admitting: *Deleted

## 2018-06-02 VITALS — BP 104/68 | HR 74 | Temp 98.7°F

## 2018-06-02 DIAGNOSIS — Z4889 Encounter for other specified surgical aftercare: Secondary | ICD-10-CM

## 2018-06-02 NOTE — Progress Notes (Signed)
States here because yesterday she noted a red knot at her incision.Today it looked worse. Per observation very small blister - fluid filled sac noted near umbilical incision.  Notified Dr. Vergie Living and he came in and checked incision, cleaned with betadine and opened blister.  Patient instructed to keep postpartum appointment and breast ultrasound appointment; come to mau or call us if incision worsens. She voices understanding.

## 2018-06-02 NOTE — Telephone Encounter (Signed)
Needs a refill for her Tramadol asap please.  [facility] on [address] is her pharmacy.

## 2018-06-03 ENCOUNTER — Ambulatory Visit
Admission: RE | Admit: 2018-06-03 | Discharge: 2018-06-03 | Disposition: A | Discharge status: Home / Self Care | Payer: Medicaid Other | Source: Ambulatory Visit | Attending: Advanced Practice Midwife | Admitting: Advanced Practice Midwife

## 2018-06-03 DIAGNOSIS — N6311 Unspecified lump in the right breast, upper outer quadrant: Secondary | ICD-10-CM

## 2018-06-03 NOTE — Telephone Encounter (Signed)
I will not prescribe opioids without an appointment as she is not on them chronically. She needs to make an appointment to be seen.

## 2018-06-04 NOTE — Telephone Encounter (Signed)
Attempted to call no answer and no machine.  Pt has appt on 06/16/18. Marissa Contreras, Marissa Contreras, [name]

## 2018-06-07 NOTE — Progress Notes (Signed)
I have reviewed the chart and agree with nursing staff's documentation of this patient's encounter.  Amr Sturtevant, MD 06/07/2018 10:28 AM    

## 2018-06-16 ENCOUNTER — Ambulatory Visit: Payer: Medicaid Other | Admitting: Family Medicine

## 2018-06-21 ENCOUNTER — Telehealth: Payer: Self-pay | Admitting: Advanced Practice Midwife

## 2018-06-21 ENCOUNTER — Ambulatory Visit: Payer: Self-pay | Admitting: Advanced Practice Midwife

## 2018-06-21 NOTE — Telephone Encounter (Signed)
Called and left patient a VM in regards to her missed PP visit. Left the office number for her to give [place] a call back.

## 2018-06-22 ENCOUNTER — Ambulatory Visit: Payer: Medicaid Other | Admitting: Family Medicine

## 2018-06-22 NOTE — Progress Notes (Deleted)
   Subjective   Patient ID: Ariya Bohannon    DOB: 06-04-1991, 27 y.o. female   MRN: [id number]  CC: "***"  HPI: Shavone Nevers is a 27 y.o. female who presents to clinic today for the following:  ***: ***  ***Patient discharged from hospital on 8/20 following SVD requiring epidural.  Pain level appeared to be controlled anesthesia.  She was given oxycodone 5 mg immediate release at discharge every 4 hours as needed for moderate pain up to 5 days, #30 tabs.  ROS: see HPI for pertinent.  PMFSH: Migraines, asthma, back pain, depression.  Surgical history D&C, tonsillectomy, tubal.  Irving Burton history fibromyalgia, arthritis, depression, DM, HTN.  Smoking status reviewed. Medications reviewed.  Objective   There were no vitals taken for this visit. Vitals and nursing note reviewed.  General: well nourished, well developed, NAD with non-toxic appearance HEENT: normocephalic, atraumatic, moist mucous membranes Neck: supple, non-tender without lymphadenopathy Cardiovascular: regular rate and rhythm without murmurs, rubs, or gallops Lungs: clear to auscultation bilaterally with normal work of breathing Abdomen: soft, non-tender, non-distended, normoactive bowel sounds Skin: warm, dry, no rashes or lesions, cap refill < 2 seconds Extremities: warm and well perfused, normal tone, no edema  Assessment & Plan   No problem-specific Assessment & Plan notes found for this encounter.  No orders of the defined types were placed in this encounter.  No orders of the defined types were placed in this encounter.   Durward Parcel, DO [facility], PGY-3 06/22/2018, 8:47 AM

## 2018-06-26 ENCOUNTER — Other Ambulatory Visit: Payer: Self-pay | Admitting: Student

## 2018-07-16 ENCOUNTER — Ambulatory Visit: Payer: Medicaid Other | Admitting: Family Medicine

## 2018-07-26 ENCOUNTER — Other Ambulatory Visit: Payer: Self-pay

## 2018-07-26 ENCOUNTER — Encounter (HOSPITAL_COMMUNITY): Payer: Self-pay | Admitting: Emergency Medicine

## 2018-07-26 ENCOUNTER — Emergency Department (HOSPITAL_COMMUNITY)
Admission: EM | Admit: 2018-07-26 | Discharge: 2018-07-26 | Disposition: A | Discharge status: Home / Self Care | Payer: Medicaid Other | Attending: Emergency Medicine | Admitting: Emergency Medicine

## 2018-07-26 DIAGNOSIS — Z79899 Other long term (current) drug therapy: Secondary | ICD-10-CM | POA: Insufficient documentation

## 2018-07-26 DIAGNOSIS — J45909 Unspecified asthma, uncomplicated: Secondary | ICD-10-CM | POA: Insufficient documentation

## 2018-07-26 DIAGNOSIS — Z87891 Personal history of nicotine dependence: Secondary | ICD-10-CM | POA: Insufficient documentation

## 2018-07-26 DIAGNOSIS — J0191 Acute recurrent sinusitis, unspecified: Secondary | ICD-10-CM | POA: Insufficient documentation

## 2018-07-26 HISTORY — DX: Other specified health status: Z78.9

## 2018-07-26 LAB — GROUP A STREP BY PCR: Group A Strep by PCR: NOT DETECTED

## 2018-07-26 MED ORDER — LORATADINE 10 MG PO TABS
10.0000 mg | ORAL_TABLET | Freq: Every day | ORAL | Status: DC
  Administered 2018-07-26: 10 mg via ORAL
  Filled 2018-07-26: qty 1

## 2018-07-26 MED ORDER — DEXAMETHASONE 4 MG PO TABS
10.0000 mg | ORAL_TABLET | Freq: Once | ORAL | Status: AC
  Administered 2018-07-26: 10 mg via ORAL
  Filled 2018-07-26: qty 3

## 2018-07-26 MED ORDER — AMOXICILLIN-POT CLAVULANATE 875-125 MG PO TABS
1.0000 | ORAL_TABLET | Freq: Once | ORAL | Status: AC
  Administered 2018-07-26: 1 via ORAL
  Filled 2018-07-26: qty 1

## 2018-07-26 MED ORDER — LORATADINE 10 MG PO TABS: 10.0000 mg | ORAL_TABLET | Freq: Every day | ORAL | 0 refills | Status: DC

## 2018-07-26 MED ORDER — SODIUM CHLORIDE-SODIUM BICARB 1.57 G NA PACK: 1.0000 | PACK | Freq: Every day | NASAL | 0 refills | Status: DC | PRN

## 2018-07-26 MED ORDER — ONDANSETRON 4 MG PO TBDP
4.0000 mg | ORAL_TABLET | Freq: Once | ORAL | Status: AC
  Administered 2018-07-26: 4 mg via ORAL
  Filled 2018-07-26: qty 1

## 2018-07-26 MED ORDER — AMOXICILLIN-POT CLAVULANATE 875-125 MG PO TABS: 1.0000 | ORAL_TABLET | Freq: Two times a day (BID) | ORAL | 0 refills | Status: DC

## 2018-07-26 NOTE — ED Triage Notes (Signed)
Pt reports congestion/sore throat for one week. Pt states began with throwing up last night.

## 2018-07-26 NOTE — ED Provider Notes (Signed)
[facility] EMERGENCY DEPARTMENT Provider Note   CSN: [id number] Arrival date & time: 07/26/18  1048     History   Chief Complaint Chief Complaint  Patient presents with  . Influenza    HPI Marissa Contreras is a 27 y.o. female.   Influenza  Presenting symptoms: cough, fever, myalgias, nausea, rhinorrhea, sore throat and vomiting   Severity:  Moderate Duration:  10 days Progression:  Worsening Chronicity:  Recurrent Relieved by:  Decongestant Worsened by:  Nothing Ineffective treatments:  OTC medications Associated symptoms: chills, decreased appetite, ear pain and nasal congestion   Associated symptoms: no neck stiffness     Past Medical History:  Diagnosis Date  . Anxiety   . Asthma   . Breastfeeding (infant)   . Chronic back pain   . Depression   . H/O varicella   . Headache   . Menorrhagia 08/19/2016  . UTI (urinary tract infection) 09/09/2017    Patient Active Problem List   Diagnosis Date Noted  . PROM (premature rupture of membranes) 05/09/2018  . Vaginal delivery 05/09/2018  . Positive GBS test 04/18/2018  . Migraine 06/03/2017  . Mild intermittent asthma 12/20/2014  . Supervision of other normal pregnancy, antepartum 01/11/2014  . Back pain 02/11/2012    Past Surgical History:  Procedure Laterality Date  . DILATION AND CURETTAGE OF UTERUS    . DILATION AND EVACUATION N/A 03/08/2014   Procedure: DILATATION AND EVACUATION ;  Surgeon: Purcell Nails, MD;  Location: [facility];  Service: Gynecology;  Laterality: N/A;  with chromosomal studies, sent   . TONSILLECTOMY    . TUBAL LIGATION Bilateral 05/10/2018   Procedure: POST PARTUM TUBAL LIGATION;  Surgeon: Adam Phenix, MD;  Location: [facility] BIRTHING SUITES;  Service: Gynecology;  Laterality: Bilateral;     OB History    Gravida  7   Para  3   Term  3   Preterm      AB  4   Living  3     SAB  4   TAB      Ectopic      Multiple  0   Live Births  3            Home Medications      Prior to Admission medications   Medication Sig Start Date End Date Taking? Authorizing Provider  acetaminophen (TYLENOL) 325 MG tablet Take 2 tablets (650 mg total) by mouth every 4 (four) hours as needed (for pain scale < 4). 05/11/18   Calvert Cantor, CNM  albuterol (PROVENTIL HFA;[name] 108 (90 Base) MCG/ACT inhaler Inhale 2 puffs into the lungs every 6 (six) hours as needed for wheezing or shortness of breath. 05/03/18   Armando Reichert, CNM  amoxicillin-clavulanate (AUGMENTIN) 875-125 MG tablet Take 1 tablet by mouth 2 (two) times daily. One po bid x 7 days 07/26/18   Sheng Pritz, Barbara Cower, MD  citalopram (CELEXA) 40 MG tablet Take 40 mg by mouth daily.    [provider]  dibucaine (NUPERCAINAL) 1 % OINT Place 1 application rectally as needed for hemorrhoids. 05/11/18   Calvert Cantor, CNM  gabapentin (NEURONTIN) 100 MG capsule Take 1 capsule (100 mg total) by mouth 3 (three) times daily. 05/13/18   Arlyce Harman, DO  ibuprofen (ADVIL,MOTRIN) 600 MG tablet Take 1 tablet (600 mg total) by mouth every 6 (six) hours. 05/11/18   Calvert Cantor, CNM  loratadine (CLARITIN) 10 MG tablet Take 1 tablet (10 mg total) by  mouth daily. One po daily x 5 days 07/26/18   Chalmers Iddings, Barbara Cower, MD  polyethylene glycol powder (GLYCOLAX/MIRALAX) powder Take 17 g by mouth daily. 05/01/18   Leftwich-Kirby, Wilmer Floor, CNM  Prenatal Vit-Fe Fumarate-FA (PRENATAL VITAMIN PLUS LOW IRON) 27-1 MG TABS Take 1 tablet by mouth daily. 05/03/18   Thressa Sheller D, CNM  Sodium Chloride-Sodium Bicarb (AYR SALINE NASAL RINSE) 1.57 g PACK Place 1 each into the nose daily as needed. 07/26/18   Donold Marotto, Barbara Cower, MD  witch hazel-glycerin (TUCKS) pad Apply 1 application topically as needed for hemorrhoids. 05/11/18   Calvert Cantor, CNM    Family History Family History  Problem Relation Age of Onset  . Hypertension Father   . Diabetes Father   . Fibromyalgia Mother   . Arthritis Mother   . Depression Mother   .  Diabetes Paternal Grandmother   . Cancer Paternal Grandmother   . Stroke Paternal Grandmother     Social History Social History   Tobacco Use  . Smoking status: Former Smoker    Packs/day: 1.00    Types: Cigarettes    Last attempt to quit: 09/24/2017    Years since quitting: 0.8  . Smokeless tobacco: Never Used  . Tobacco comment: quit for pregnancy  Substance Use Topics  . Alcohol use: No  . Drug use: No     Allergies   Patient has no known allergies.   Review of Systems Review of Systems  Constitutional: Positive for chills, decreased appetite and fever.  HENT: Positive for congestion, ear pain, rhinorrhea and sore throat.   Respiratory: Positive for cough.   Gastrointestinal: Positive for nausea and vomiting.  Musculoskeletal: Positive for myalgias. Negative for neck stiffness.  All other systems reviewed and are negative.    Physical Exam Updated Vital Signs BP 130/87 (BP Location: Right Arm)   Pulse 91   Temp 98.8 F (37.1 C) (Oral)   Resp 18   Ht 5\' 6"  (1.676 m)   Wt 77.1 kg   LMP 05/30/2018   SpO2 99%   Breastfeeding? Yes   BMI 27.44 kg/m   Physical Exam  Constitutional: She is oriented to person, place, and time. She appears well-developed and well-nourished.  HENT:  Head: Normocephalic and atraumatic.  Right Ear: Tympanic membrane and external ear normal.  Left Ear: Tympanic membrane and external ear normal.  Mouth/Throat: Mucous membranes are normal. Posterior oropharyngeal erythema present. No tonsillar abscesses.  Frontal sinus ttp and bilateral maxillary sinus ttp  Eyes: Conjunctivae and EOM are normal.  Neck: Normal range of motion.  Cardiovascular: Normal rate and regular rhythm.  Pulmonary/Chest: Effort normal and breath sounds normal. No stridor. No respiratory distress.  Abdominal: Soft. Bowel sounds are normal. She exhibits no distension.  Musculoskeletal: Normal range of motion. She exhibits no edema, tenderness or deformity.    Neurological: She is alert and oriented to person, place, and time.  Skin: Skin is warm and dry.  Nursing note and vitals reviewed.    ED Treatments / Results  Labs (all labs ordered are listed, but only abnormal results are displayed) Labs Reviewed  GROUP A STREP BY PCR    EKG None  Radiology No results found.  Procedures Procedures (including critical care time)  Medications Ordered in ED Medications  dexamethasone (DECADRON) tablet 10 mg (10 mg Oral Given 07/26/18 1235)  amoxicillin-clavulanate (AUGMENTIN) 875-125 MG per tablet 1 tablet (1 tablet Oral Given 07/26/18 1232)  ondansetron (ZOFRAN-ODT) disintegrating tablet 4 mg (4 mg Oral Given 07/26/18  1232)     Initial Impression / Assessment and Plan / ED Course  I have reviewed the triage vital signs and the nursing notes.  Pertinent labs & imaging results that were available during my care of the patient were reviewed by me and considered in my medical decision making (see chart for details).     10 days of sinus symptoms with PND that is now causing sore throat, cough and post-tussive vomiting. Will tx for sinusitis. Patient requesting ear drops for pain but no obvious otitis externa or AOM. subsequently requested refill for tramadol which I did not fill and referred to her normal prescriber. Stable for dc w/ pcp follow up.  Final Clinical Impressions(s) / ED Diagnoses   Final diagnoses:  Acute recurrent sinusitis, unspecified location    ED Discharge Orders         Ordered    amoxicillin-clavulanate (AUGMENTIN) 875-125 MG tablet  2 times daily     07/26/18 1334    loratadine (CLARITIN) 10 MG tablet  Daily     07/26/18 1334    Sodium Chloride-Sodium Bicarb (AYR SALINE NASAL RINSE) 1.57 g PACK  Daily PRN     07/26/18 1334           Burt Piatek, Barbara Cower, MD 07/26/18 1915

## 2018-08-29 ENCOUNTER — Ambulatory Visit (HOSPITAL_COMMUNITY)
Admission: EM | Admit: 2018-08-29 | Discharge: 2018-08-29 | Disposition: A | Discharge status: Home / Self Care | Payer: Self-pay | Attending: Physician Assistant | Admitting: Physician Assistant

## 2018-08-29 ENCOUNTER — Encounter (HOSPITAL_COMMUNITY): Payer: Self-pay | Admitting: Emergency Medicine

## 2018-08-29 DIAGNOSIS — J4521 Mild intermittent asthma with (acute) exacerbation: Secondary | ICD-10-CM

## 2018-08-29 MED ORDER — PREDNISONE 20 MG PO TABS: ORAL_TABLET | ORAL | 0 refills | Status: AC

## 2018-08-29 MED ORDER — ALBUTEROL SULFATE HFA 108 (90 BASE) MCG/ACT IN AERS: 2.0000 | INHALATION_SPRAY | Freq: Four times a day (QID) | RESPIRATORY_TRACT | 2 refills | Status: DC | PRN

## 2018-08-29 NOTE — ED Provider Notes (Signed)
08/29/2018 5:39 PM   DOB: 09-Nov-1990 / MRN: [id number]  SUBJECTIVE:  Marissa Contreras is a 27 y.o. female presenting for wheezing and cough that has been going on for about 1 month now.  She has a history of asthma.  She complains of wheezing.  Her work will not let her come back unless she has a note from a medical provider.  She has No Known Allergies.   She  has a past medical history of Anxiety, Asthma, Breastfeeding (infant), Chronic back pain, Depression, H/O varicella, Headache, Menorrhagia (08/19/2016), and UTI (urinary tract infection) (09/09/2017).    She  reports that she quit smoking about 11 months ago. Her smoking use included cigarettes. She smoked 1.00 pack per day. She has never used smokeless tobacco. She reports that she does not drink alcohol or use drugs. She  reports that she currently engages in sexual activity. She reports using the following method of birth control/protection: None. The patient  has a past surgical history that includes Tonsillectomy; Dilation and evacuation (N/A, 03/08/2014); Dilation and curettage of uterus; and Tubal ligation (Bilateral, 05/10/2018).  Her family history includes Arthritis in her mother; Cancer in her paternal grandmother; Depression in her mother; Diabetes in her father and paternal grandmother; Fibromyalgia in her mother; Hypertension in her father; Stroke in her paternal grandmother.  Review of Systems  Constitutional: Negative for chills, diaphoresis and fever.  Respiratory: Positive for cough and wheezing. Negative for hemoptysis, sputum production and shortness of breath.   Cardiovascular: Negative for chest pain, orthopnea and leg swelling.  Gastrointestinal: Negative for nausea.  Skin: Negative for rash.  Neurological: Negative for dizziness.    OBJECTIVE:  BP 128/81   Pulse 89   Temp 98.7 F (37.1 C)   Resp 16   SpO2 100%   Breastfeeding? Yes   Wt Readings from Last 3 Encounters:  07/26/18 170 lb (77.1 kg)  05/25/18 173  lb 1.6 oz (78.5 kg)  05/09/18 193 lb 8 oz (87.8 kg)   Temp Readings from Last 3 Encounters:  08/29/18 98.7 F (37.1 C)  07/26/18 98.8 F (37.1 C) (Oral)  06/02/18 98.7 F (37.1 C)   BP Readings from Last 3 Encounters:  08/29/18 128/81  07/26/18 130/87  06/02/18 104/68   Pulse Readings from Last 3 Encounters:  08/29/18 89  07/26/18 91  06/02/18 74    Physical Exam  Constitutional: She is oriented to person, place, and time. She appears well-nourished. No distress.  Eyes: Pupils are equal, round, and reactive to light. EOM are normal.  Cardiovascular: Normal rate, regular rhythm, S1 normal, S2 normal, normal heart sounds and intact distal pulses. Exam reveals no gallop, no friction rub and no decreased pulses.  No murmur heard. Pulmonary/Chest: Effort normal. No stridor. No respiratory distress. She has wheezes (Diffuse and faint.). She has no rales.  Abdominal: She exhibits no distension.  Musculoskeletal: She exhibits no edema.  Neurological: She is alert and oriented to person, place, and time. No cranial nerve deficit. Gait normal.  Skin: Skin is dry. She is not diaphoretic.  Psychiatric: She has a normal mood and affect.  Vitals reviewed.   No results found for this or any previous visit (from the past 72 hour(s)).  No results found.  ASSESSMENT AND PLAN:   Mild intermittent asthma with acute exacerbation    Discharge Instructions     Start the prednisone.  I would suggest that you take this first thing in the morning.  This can come out in your  milk supply.  A way to avoid this is by taking the prednisone and then pumping 1 hour later.  This will reduce transfer to the implant.        The patient is advised to call or return to clinic if she does not see an improvement in symptoms, or to seek the care of the closest emergency department if she worsens with the above plan.   Deliah BostonMichael Stephenia Vogan, MHS, PA-C 08/29/2018 5:39 PM   Ofilia Neaslark, Durante Violett L, PA-C 08/29/18  1739

## 2018-08-29 NOTE — ED Triage Notes (Signed)
Pt c/o cough, sinus pressure in her face x1 month.

## 2018-08-29 NOTE — Discharge Instructions (Addendum)
Start the prednisone.  I would suggest that you take this first thing in the morning.  This can come out in your milk supply.  A way to avoid this is by taking the prednisone and then pumping 1 hour later.  This will reduce transfer to the implant.

## 2018-09-23 ENCOUNTER — Ambulatory Visit (INDEPENDENT_AMBULATORY_CARE_PROVIDER_SITE_OTHER): Payer: Self-pay | Admitting: Family Medicine

## 2018-09-23 VITALS — BP 130/75 | HR 110 | Temp 98.2°F | Wt 172.0 lb

## 2018-09-23 DIAGNOSIS — M542 Cervicalgia: Secondary | ICD-10-CM

## 2018-09-23 DIAGNOSIS — F331 Major depressive disorder, recurrent, moderate: Secondary | ICD-10-CM

## 2018-09-23 MED ORDER — SERTRALINE HCL 50 MG PO TABS: 50.0000 mg | ORAL_TABLET | Freq: Every day | ORAL | 0 refills | Status: DC

## 2018-09-23 MED ORDER — GABAPENTIN 100 MG PO CAPS: 200.0000 mg | ORAL_CAPSULE | Freq: Three times a day (TID) | ORAL | 3 refills | Status: DC

## 2018-09-23 MED ORDER — AMITRIPTYLINE HCL 10 MG PO TABS: 10.0000 mg | ORAL_TABLET | Freq: Every day | ORAL | 2 refills | Status: DC

## 2018-09-23 MED ORDER — BENZONATATE 100 MG PO CAPS: 100.0000 mg | ORAL_CAPSULE | Freq: Two times a day (BID) | ORAL | 0 refills | Status: DC | PRN

## 2018-09-23 NOTE — Progress Notes (Signed)
Subjective: Chief Complaint  Patient presents with  . Depression    medication refill     HPI: Marissa Contreras is a 28 y.o. presenting to clinic today to discuss the following:  Depression Per patient she has felt like her depression has worsened since her pregnancy. She states she was on Celexa but it was stopped due to pregnancy on 08/16/18 and she admittedly did not return to clinic to discuss alternatives to take while pregnant. Since then and since giving birth her depression has increased and she endorses feeling overwhelmed at times, low energy, not wanting to take part in hobbies that she found enjoyable, and has had thoughts of ending her life. She states they have just been thoughts and is not planning on acting on those thoughts. She has no plan, no gun in the home. When asked about what stops her from hurting herself she states she "wants to be there for my husband and children". Of note, she is breastfeeding.   Neck Pain Started before and has continued since pregnancy. States "nothing is working". She has tried OTC NSAIDs, Tylenol, topicals, and home stretches and exercises.  Health Maintenance: none     ROS noted in HPI.   Past Medical, Surgical, Social, and Family History Reviewed & Updated per EMR.   Pertinent Historical Findings include:   Social History   Tobacco Use  Smoking Status Former Smoker  . Packs/day: 1.00  . Types: Cigarettes  . Last attempt to quit: 09/24/2017  . Years since quitting: 0.9  Smokeless Tobacco Never Used  Tobacco Comment   quit for pregnancy    Objective: BP 130/75 (BP Location: Left Arm, Patient Position: Sitting, Cuff Size: Normal)   Pulse (!) 110   Temp 98.2 F (36.8 C) (Oral)   Wt 172 lb (78 kg)   SpO2 99%   Breastfeeding Yes   BMI 27.76 kg/m  Vitals and nursing notes reviewed  Physical Exam Gen: Alert and Oriented x 3, NAD HEENT: Normocephalic, atraumatic, PERRLA, EOMI Neck: trachea midline, no thyroidmegaly, no  LAD CV: RRR, no murmurs, normal S1, S2 split Resp: CTAB, no wheezing, rales, or rhonchi, comfortable work of breathing Ext: no clubbing, cyanosis, or edema Skin: warm, dry, intact, no rashes Psych: depressed mood, slightly flat affect, appropriate behavior, pleasant  No results found for this or any previous visit (from the past 72 hour(s)).  Assessment/Plan:  Neck pain Given patient is breastfeeding and has tried multiple OTC medications will start her on Amitriptyline 10mg  at night for one month to see if she gets any benefit. I do feel like depression has a part to play in her pain. - Cont Gabapentin 200mg  TID  - Cont stretches etc.   Depression Patient agreed to contact and go to ED if she begins having suicidal thoughts/ideations and begins for formulate a plan. As of now, no plan and wants to be around for her children but has thought of hurting herself and has thoughts of "wish I were dead". Patient is breastfeeding but did have success on Celexa but Zoloft is preferred with breastfeeding. - Zoloft 50mg  for 2 weeks and then increase to 100mg .  - F/u in 2 weeks to ensure patient remains stable for psych perspective - May get additional benefit from amitriptyline started for neck neuropathic pain   PATIENT EDUCATION PROVIDED: See AVS    Diagnosis and plan along with any newly prescribed medication(s) were discussed in detail with this patient today. The patient verbalized understanding and  agreed with the plan. Patient advised if symptoms worsen return to clinic or ER.   Health Maintainance: none   No orders of the defined types were placed in this encounter.   Meds ordered this encounter  Medications  . sertraline (ZOLOFT) 50 MG tablet    Sig: Take 1 tablet (50 mg total) by mouth daily.    Dispense:  14 tablet    Refill:  0  . gabapentin (NEURONTIN) 100 MG capsule    Sig: Take 2 capsules (200 mg total) by mouth 3 (three) times daily.    Dispense:  90 capsule    Refill:   3     Tim Karen Chafe, DO 09/23/2018, 4:28 PM PGY-2 [facility]

## 2018-09-23 NOTE — Patient Instructions (Signed)
It was great to see you today! Thank you for letting me participate in your care!  Today, we discussed your continued neck and back pain. I am starting you on a medication called Amitriptyline. It is a low dose and is used for neuropathic pain and can also help with headaches. Please take it once a day at night.  For your depression I am starting you on Sertraline also known as Zoloft at the lowest dose. I want you to return in two weeks so I can evaluate you to make sure it is not getting worse. I will most likely increase the dose in two weeks as long as you are not having any side effects.  For your cough it is most likely a viral bronchitis and will run its course. I have given you a prescription for tessalon perles that should help. If it does not improve, you develop fever, chills, shortness of breath please seek care immediately.  Be well, Jules Schick, DO PGY-2, [name]

## 2018-09-28 DIAGNOSIS — M542 Cervicalgia: Secondary | ICD-10-CM | POA: Insufficient documentation

## 2018-09-28 HISTORY — DX: Cervicalgia: M54.2

## 2018-09-28 NOTE — Assessment & Plan Note (Signed)
Given patient is breastfeeding and has tried multiple OTC medications will start her on Amitriptyline 10mg  at night for one month to see if she gets any benefit. I do feel like depression has a part to play in her pain. - Cont Gabapentin 200mg  TID  - Cont stretches etc.

## 2018-09-28 NOTE — Assessment & Plan Note (Signed)
Patient agreed to contact and go to ED if she begins having suicidal thoughts/ideations and begins for formulate a plan. As of now, no plan and wants to be around for her children but has thought of hurting herself and has thoughts of "wish I were dead". Patient is breastfeeding but did have success on Celexa but Zoloft is preferred with breastfeeding. - Zoloft 50mg  for 2 weeks and then increase to 100mg .  - F/u in 2 weeks to ensure patient remains stable for psych perspective - May get additional benefit from amitriptyline started for neck neuropathic pain

## 2018-10-06 ENCOUNTER — Telehealth: Payer: Self-pay | Admitting: *Deleted

## 2018-10-06 ENCOUNTER — Other Ambulatory Visit: Payer: Self-pay | Admitting: Family Medicine

## 2018-10-06 NOTE — Telephone Encounter (Signed)
Pt calls because she does not have enough Zoloft to last until Friday.  She is requesting 3 pills till appt.  Keyetta Hollingworth, Maryjo Rochester, [name]

## 2018-10-07 NOTE — Telephone Encounter (Signed)
LVM for patient concerning RX.  Marland KitchenGlennie Hawk, [name]

## 2018-10-08 ENCOUNTER — Ambulatory Visit (INDEPENDENT_AMBULATORY_CARE_PROVIDER_SITE_OTHER): Payer: Self-pay | Admitting: Family Medicine

## 2018-10-08 VITALS — BP 110/70 | HR 71 | Temp 98.7°F | Wt 174.4 lb

## 2018-10-08 DIAGNOSIS — F331 Major depressive disorder, recurrent, moderate: Secondary | ICD-10-CM

## 2018-10-08 DIAGNOSIS — M542 Cervicalgia: Secondary | ICD-10-CM

## 2018-10-08 MED ORDER — SERTRALINE HCL 100 MG PO TABS: 100.0000 mg | ORAL_TABLET | Freq: Every day | ORAL | 3 refills | Status: DC

## 2018-10-08 MED ORDER — CYCLOBENZAPRINE HCL 10 MG PO TABS: 10.0000 mg | ORAL_TABLET | Freq: Every day | ORAL | 0 refills | Status: DC

## 2018-10-08 MED ORDER — GABAPENTIN 100 MG PO CAPS: 300.0000 mg | ORAL_CAPSULE | Freq: Three times a day (TID) | ORAL | 3 refills | Status: DC

## 2018-10-08 NOTE — Progress Notes (Signed)
Subjective: Chief Complaint  Patient presents with  . Medication Management    anti depressants     HPI: Marissa Contreras is a 28 y.o. presenting to clinic today to discuss the following:  Depression Patient states no HI/SI today and thinks sertraline has helped some but not a lot. She did feel like her mood improved slightly but overall still dealing with feelings of being overwhelmed, tired, low motivation, feeling depressed, and less interested in activities that she used to enjoy.  Neck Pain Continued chronic issue. The pain has not improved and she did not think Amitriptyline helped and did not like the way it made her feel. The pain has not change in quality, location, or intensity since her last visit.  Health Maintenance: influenza vaccine     ROS noted in HPI.   Past Medical, Surgical, Social, and Family History Reviewed & Updated per EMR.   Pertinent Historical Findings include:   Social History   Tobacco Use  Smoking Status Former Smoker  . Packs/day: 1.00  . Types: Cigarettes  . Last attempt to quit: 09/24/2017  . Years since quitting: 1.0  Smokeless Tobacco Never Used  Tobacco Comment   quit for pregnancy    Objective: BP 110/70   Pulse 71   Temp 98.7 F (37.1 C)   Wt 174 lb 6.4 oz (79.1 kg)   SpO2 100%   BMI 28.15 kg/m  Vitals and nursing notes reviewed  Physical Exam Gen: Alert and Oriented x 3, NAD HEENT: Normocephalic, atraumatic, PERRLA, EOMI, TM visible with good light reflex, non-swollen, non-erythematous turbinates, non-erythematous pharyngeal mucosa, no exudates Neck: trachea midline, no thyroidmegaly, no LAD CV: RRR, no murmurs, normal S1, S2 split Resp: CTAB, no wheezing, rales, or rhonchi, comfortable work of breathing Abd: non-distended, non-tender, soft, +bs in all four quadrants MSK: No obvious deformities of the cervical spine, TTP along the spinal musculature bilaterally, FROM in flexion, extension, side bending, and rotation,  gross sensation intact but slightly decreased on the left, strength 5/5, Spurling's test positive on the right Ext: no clubbing, cyanosis, or edema Neuro: No gross deficits Skin: warm, dry, intact, no rashes  No results found for this or any previous visit (from the past 72 hour(s)).  Assessment/Plan:  Neck pain Unclear if this is more radicular vs MSK vs possible somatization due to depression. If her pain continues I will consider referral to orthopedics vs sports medicine but will like her to try rehab first. I am hoping this will improve once her depression improves. - Increase Gabapentin to 300mg  TID - Cont stretches - Restarted flexeril  Depression Improvement with Sertraline 50mg  and no side effects so will increase to 100mg  daily. - Sertraline 100mg  daily - follow up in one month   PATIENT EDUCATION PROVIDED: See AVS    Diagnosis and plan along with any newly prescribed medication(s) were discussed in detail with this patient today. The patient verbalized understanding and agreed with the plan. Patient advised if symptoms worsen return to clinic or ER.   Health Maintainance: influenza   No orders of the defined types were placed in this encounter.   Meds ordered this encounter  Medications  . sertraline (ZOLOFT) 100 MG tablet    Sig: Take 1 tablet (100 mg total) by mouth daily.    Dispense:  30 tablet    Refill:  3  . gabapentin (NEURONTIN) 100 MG capsule    Sig: Take 3 capsules (300 mg total) by mouth 3 (three) times  daily.    Dispense:  90 capsule    Refill:  3  . cyclobenzaprine (FLEXERIL) 10 MG tablet    Sig: Take 1 tablet (10 mg total) by mouth at bedtime.    Dispense:  30 tablet    Refill:  0     Jules Schick, DO 10/08/2018, 2:17 PM PGY-2 [facility]

## 2018-10-08 NOTE — Patient Instructions (Addendum)
It was great to see you today! Thank you for letting me participate in your care!  Today, we discussed your depression and I am glad that it is slightly better. We will increase Zoloft today to 100mg  once a day. Please try this for 2 weeks and if it is not better call me and we can increase it over the phone.   For your neck pain I have stopped amitriptyline, restarted flexeril, and increased gabapentin to 300mg  TID. Please obtain the cervical spine x-ray and I will call you with results and we will decide the next best steps from that point.  Be well, Marissa Schickim Amro Winebarger, DO PGY-2, [facility]

## 2018-10-13 NOTE — Assessment & Plan Note (Signed)
Unclear if this is more radicular vs MSK vs possible somatization due to depression. If her pain continues I will consider referral to orthopedics vs sports medicine but will like her to try rehab first. I am hoping this will improve once her depression improves. - Increase Gabapentin to 300mg  TID - Cont stretches - Restarted flexeril

## 2018-10-13 NOTE — Assessment & Plan Note (Signed)
Improvement with Sertraline 50mg  and no side effects so will increase to 100mg  daily. - Sertraline 100mg  daily - follow up in one month

## 2018-10-20 ENCOUNTER — Other Ambulatory Visit (INDEPENDENT_AMBULATORY_CARE_PROVIDER_SITE_OTHER): Payer: Self-pay | Admitting: Family Medicine

## 2018-10-20 ENCOUNTER — Telehealth: Payer: Self-pay

## 2018-10-20 MED ORDER — GABAPENTIN 100 MG PO CAPS: 100.0000 mg | ORAL_CAPSULE | Freq: Three times a day (TID) | ORAL | 1 refills | Status: DC

## 2018-10-20 NOTE — Progress Notes (Signed)
Refilling Gabapentin to match 30 day supply

## 2018-10-20 NOTE — Telephone Encounter (Signed)
Please send in 30 day supply of Gabapentin. The way she is supposed to take it, #90 is only a 10 day supply.  Ples Specter, RN [facility] RN)

## 2018-10-20 NOTE — Progress Notes (Signed)
Updating Gabapentin to match for 90 days plus one refill. Discussed possible dangers given she is breatfeeding, risks/benefits discussed and patient decided she wanted to try increase in Gabapentin.

## 2018-10-25 ENCOUNTER — Ambulatory Visit
Admission: RE | Admit: 2018-10-25 | Discharge: 2018-10-25 | Disposition: A | Discharge status: Home / Self Care | Payer: No Typology Code available for payment source | Source: Ambulatory Visit | Attending: Family Medicine | Admitting: Family Medicine

## 2018-10-25 DIAGNOSIS — M542 Cervicalgia: Secondary | ICD-10-CM

## 2018-10-29 ENCOUNTER — Telehealth: Payer: Self-pay

## 2018-10-29 NOTE — Telephone Encounter (Signed)
Patient calling for xray results.  [phone]   Ples Specter, RN [facility] RN)

## 2018-11-01 ENCOUNTER — Other Ambulatory Visit: Payer: Self-pay | Admitting: Family Medicine

## 2018-11-01 MED ORDER — VENLAFAXINE HCL ER 75 MG PO CP24: 75.0000 mg | ORAL_CAPSULE | Freq: Every day | ORAL | 1 refills | Status: DC

## 2018-11-01 MED ORDER — GABAPENTIN 300 MG PO CAPS: 300.0000 mg | ORAL_CAPSULE | Freq: Three times a day (TID) | ORAL | 3 refills | Status: DC

## 2018-11-01 NOTE — Progress Notes (Signed)
Called patient to give her results on x-ray. Patient states zoloft not doing a good job of controlling symptoms. Patient had a good past experience with Effexor so switching to that. If no improvement refer to Psych. Updated Gabapentin prescription to 300mg  TID

## 2018-11-03 ENCOUNTER — Ambulatory Visit (INDEPENDENT_AMBULATORY_CARE_PROVIDER_SITE_OTHER): Payer: Self-pay | Admitting: Family Medicine

## 2018-11-03 ENCOUNTER — Encounter: Payer: Self-pay | Admitting: Family Medicine

## 2018-11-03 ENCOUNTER — Other Ambulatory Visit: Payer: Self-pay

## 2018-11-03 VITALS — BP 128/80 | HR 106 | Temp 99.0°F | Wt 172.4 lb

## 2018-11-03 DIAGNOSIS — M546 Pain in thoracic spine: Secondary | ICD-10-CM

## 2018-11-03 DIAGNOSIS — M25552 Pain in left hip: Secondary | ICD-10-CM

## 2018-11-03 DIAGNOSIS — G8929 Other chronic pain: Secondary | ICD-10-CM

## 2018-11-03 DIAGNOSIS — M25551 Pain in right hip: Secondary | ICD-10-CM

## 2018-11-03 MED ORDER — CYCLOBENZAPRINE HCL 10 MG PO TABS: 10.0000 mg | ORAL_TABLET | Freq: Three times a day (TID) | ORAL | 1 refills | Status: DC | PRN

## 2018-11-03 NOTE — Patient Instructions (Signed)
It was great to see you today! Thank you for letting me participate in your care!  Today, we discussed your ongoing back and hip pain. I have referred you to orthopedist and for physical therapy to see if we can get your pain under better control. Please continue stretches and exercises at home.  Be well, Jules Schick, DO PGY-2, [name]

## 2018-11-03 NOTE — Progress Notes (Signed)
Subjective: Chief Complaint  Patient presents with  . MRI    HPI: Marissa Contreras is a 28 y.o. presenting to clinic today to discuss the following:  Chronic Low Back with new Thoracic Back Pain Patient having multiple issues with continued low back pain that is not been responsive to home stretching exercises, lidocaine patches, Tylenol, Motrin, Gabapentin, OTC creams. She states it is worse with movement but denies red flag symptoms such as saddle anesthesia, loss of bowel or bladder control. No inciting injury. She states she is having difficulty sleeping as due to having difficulty finding a comfortable position.  Her mid thoracic pain has caused her problems in the past and she states she had a work up for it about 6 years ago where breast reduction surgery was recommended but she cannot afford it. She is wanting an MRI as she states she is having pain again after a long time of it not bothering her. She endorses no recent falls or injuries.   She denies fever, chills, dizziness, syncope, SOB, abdominal pain, nausea, vomiting, diarrhea, or constipation.  Of note, she requested percocet for her husband saying "he hurt his back bending over the pick up the dog". I informed her it would not be good care to give him any medication without being seen and I would not recommend percocet for acute low back pain as it has not shown to be effective.   ROS noted in HPI.   Past Medical, Surgical, Social, and Family History Reviewed & Updated per EMR.   Pertinent Historical Findings include:   Social History   Tobacco Use  Smoking Status Former Smoker  . Packs/day: 1.00  . Types: Cigarettes  . Last attempt to quit: 09/24/2017  . Years since quitting: 1.1  Smokeless Tobacco Never Used  Tobacco Comment   quit for pregnancy    Objective: BP 128/80   Pulse (!) 106   Temp 99 F (37.2 C) (Oral)   Wt 172 lb 6.4 oz (78.2 kg)   SpO2 97%   BMI 27.83 kg/m  Vitals and nursing notes  reviewed  Physical Exam Gen: Alert and Oriented x 3, NAD HEENT: Normocephalic, atraumatic CV: RRR, no murmurs, normal S1, S2 split Resp: CTAB, no wheezing, rales, or rhonchi, comfortable work of breathing MSK:  BACK: No obvious deformity no obvious scoliosis, TTP along the mid thoracic sinal musculature but no spinous process tenderness, TTP along the lumbar paraspinal muscles, decreased flexion and extension, normal rotation and side bending, normal gait Hip: No obvious rash, erythema, ecchymosis, or edema. ROM full in all directions; Strength 5/5 in IR/ER/Flex/Ext/Abd/Add. Standing hip rotation and gait without trendelenburg / unsteadiness. Greater trochanter without tenderness to palpation. Tenderness over piriformis on the left. Negative FABER and FADIR bilaterally. Of note, observed subluxation of both hip joints while standing Ext: no clubbing, cyanosis, or edema Skin: warm, dry, intact, no rashes  Assessment/Plan:  Bilateral hip pain Observed patient was able to sublux both hip joints. Difficult to assess how much she is actually having pain from this issue as patient has long standing pain issues, depression, anxiety, and has demonstrated concern for pain seeking behavior. I do feel she may benefit from sports medicine/ortho referral to see what options exist for her. - Referral to PT to strength supporting muscles - Ice, heat, OTC Ibuprofen for pain control  Back pain Patient with long standing low back pain that is not responding to conservative treatment options. She has been doing some home stretches  with little long term benefit. - Referral to PT and to ortho to explore other options for low back pain such as injections; she has had imaging in the past which revealed no abnormalities. She has not had imaging of her thoracic spine but given she wants to see Ortho I will defer imaging until she sees them. - I do have concern about potential pain seeking behavior as her pain moves  around to multiple areas (cervical, thoracic, lumbar, legs, etc) with no discernable pattern. Also concerned that "nothing works" but she has made multiple inquiries into opioids. I do not think it is in her best medical interest to prescribe her opiates as studies support she will get no longer term benefit from them for her low back pain and the risk of developing dependence. - I am hopeful that if I can get better control of her depression this may result in better pain control.   PATIENT EDUCATION PROVIDED: See AVS    Diagnosis and plan along with any newly prescribed medication(s) were discussed in detail with this patient today. The patient verbalized understanding and agreed with the plan. Patient advised if symptoms worsen return to clinic or ER.    Orders Placed This Encounter  Procedures  . Ambulatory referral to Orthopedic Surgery    Referral Priority:   Routine    Referral Type:   Surgical    Referral Reason:   Specialty Services Required    Requested Specialty:   Orthopedic Surgery    Number of Visits Requested:   1  . Ambulatory referral to Physical Therapy    Referral Priority:   Routine    Referral Type:   Physical Medicine    Referral Reason:   Specialty Services Required    Requested Specialty:   Physical Therapy    Number of Visits Requested:   1    Meds ordered this encounter  Medications  . cyclobenzaprine (FLEXERIL) 10 MG tablet    Sig: Take 1 tablet (10 mg total) by mouth 3 (three) times daily as needed for muscle spasms.    Dispense:  90 tablet    Refill:  1    Tim Karen Chafe, DO 11/03/2018, 4:12 PM PGY-2 [facility]

## 2018-11-04 NOTE — Assessment & Plan Note (Signed)
Observed patient was able to sublux both hip joints. Difficult to assess how much she is actually having pain from this issue as patient has long standing pain issues, depression, anxiety, and has demonstrated concern for pain seeking behavior. I do feel she may benefit from sports medicine/ortho referral to see what options exist for her. - Referral to PT to strength supporting muscles - Ice, heat, OTC Ibuprofen for pain control

## 2018-11-04 NOTE — Assessment & Plan Note (Signed)
Patient with long standing low back pain that is not responding to conservative treatment options. She has been doing some home stretches with little long term benefit. - Referral to PT and to ortho to explore other options for low back pain such as injections; she has had imaging in the past which revealed no abnormalities. She has not had imaging of her thoracic spine but given she wants to see Ortho I will defer imaging until she sees them. - I do have concern about potential pain seeking behavior as her pain moves around to multiple areas (cervical, thoracic, lumbar, legs, etc) with no discernable pattern. Also concerned that "nothing works" but she has made multiple inquiries into opioids. I do not think it is in her best medical interest to prescribe her opiates as studies support she will get no longer term benefit from them for her low back pain and the risk of developing dependence. - I am hopeful that if I can get better control of her depression this may result in better pain control.

## 2018-12-25 ENCOUNTER — Encounter: Payer: Self-pay | Admitting: Family Medicine

## 2019-01-05 ENCOUNTER — Encounter: Payer: Self-pay | Admitting: Family Medicine

## 2019-01-24 ENCOUNTER — Other Ambulatory Visit: Payer: Self-pay | Admitting: Family Medicine

## 2019-03-08 ENCOUNTER — Telehealth: Payer: Self-pay | Admitting: *Deleted

## 2019-03-08 NOTE — Telephone Encounter (Signed)
Prior Josem Kaufmann is needed for the 24 hour tablet of venlafaxine.  If you change to venlafaxine ER capsule it will be covered.   Are you willing to change or do you want to attempt PA for tablet?  Christen Bame, [name]

## 2019-03-09 ENCOUNTER — Telehealth: Payer: Self-pay | Admitting: Family Medicine

## 2019-03-09 NOTE — Telephone Encounter (Signed)
Pt's mom called about pt's insurance needing approval for one of her medications.

## 2019-03-10 NOTE — Telephone Encounter (Signed)
Pt will be out of medication today.  Calling to check status. Christen Bame, [name]

## 2019-03-11 ENCOUNTER — Telehealth: Payer: Self-pay | Admitting: Family Medicine

## 2019-03-11 ENCOUNTER — Other Ambulatory Visit: Payer: Self-pay | Admitting: Family Medicine

## 2019-03-11 MED ORDER — VENLAFAXINE HCL ER 75 MG PO TB24: 75.0000 mg | ORAL_TABLET | Freq: Every day | ORAL | 1 refills | Status: DC

## 2019-03-11 NOTE — Telephone Encounter (Signed)
**  After Hours/ Emergency Line Call**  Received a call to report that Elder Cyphers for refill. Patient has been on effexor "for a while". Has been leaving messages for PCP as well as pharmacy. Does not feel well being off of it. "Needs to be on it". Patient feels stressed and feels like she needs to get back on medicine. Feels like she is withdrawing. Per chart review Dr. Garlan Fillers sent in refill today. Patient she did not realize it was sent in today. I informed her to check with pharmacy, if it is not there I can send in a refill since clinic will not open till Monday. Red flags discussed.  Will forward to PCP.  [name], DO PGY-2, [facility] 03/11/2019 4:01 PM

## 2019-03-11 NOTE — Progress Notes (Signed)
Patient requested Venlafaxine ER tablet so insurance would cover. Sent to pharmacy as requested.

## 2019-03-11 NOTE — Telephone Encounter (Signed)
**  After Hours/ Emergency Line Call**  Received a call to report that Marissa Contreras. Patient called pharmacy and they stated patient needs prior authorization for venlafaxine. Informed patient that this is emergency line so I am unable to fill this documentation out but am happy to message PCP. Patient states she would be very appreciative of this. Will forward to PCP.  [name], DO PGY-2, [facility] 03/11/2019 4:15 PM

## 2019-03-11 NOTE — Telephone Encounter (Signed)
Opened in error

## 2019-03-14 NOTE — Telephone Encounter (Signed)
Capsule is needed if appropriate, not tablet. Christen Bame, [name]

## 2019-03-16 ENCOUNTER — Other Ambulatory Visit: Payer: Self-pay | Admitting: Family Medicine

## 2019-03-16 MED ORDER — VENLAFAXINE HCL ER 75 MG PO CP24: 75.0000 mg | ORAL_CAPSULE | Freq: Every day | ORAL | 1 refills | Status: DC

## 2019-03-16 NOTE — Progress Notes (Unsigned)
Patient requesting capsule.

## 2019-03-28 ENCOUNTER — Telehealth: Payer: Self-pay | Admitting: Family Medicine

## 2019-03-28 NOTE — Telephone Encounter (Signed)
Patient's mother called in wanting to schedule an appointment for her daughter because she is experiencing some breast pains and thinks she might have another infection. Patient's mother stated that the patient is nursing. Patient was scheduled for an appointment on 7/7 @ 8:35. I asked to speak to the patient and patient verified her name and DOB. Patient instructed to wear a face mask for the appointment and no visitors and allowed with her. Patient screened for covid symptoms and denied having any.

## 2019-03-29 ENCOUNTER — Ambulatory Visit: Payer: No Typology Code available for payment source | Admitting: Student

## 2019-03-29 ENCOUNTER — Encounter: Payer: Self-pay | Admitting: Medical

## 2019-04-20 ENCOUNTER — Ambulatory Visit (INDEPENDENT_AMBULATORY_CARE_PROVIDER_SITE_OTHER): Payer: Medicaid Other | Admitting: Family Medicine

## 2019-04-20 ENCOUNTER — Other Ambulatory Visit: Payer: Self-pay

## 2019-04-20 ENCOUNTER — Encounter: Payer: Self-pay | Admitting: Family Medicine

## 2019-04-20 ENCOUNTER — Ambulatory Visit
Admission: RE | Admit: 2019-04-20 | Discharge: 2019-04-20 | Disposition: A | Discharge status: Home / Self Care | Payer: Medicaid Other | Source: Ambulatory Visit | Attending: Family Medicine | Admitting: Family Medicine

## 2019-04-20 VITALS — BP 115/80 | HR 102

## 2019-04-20 DIAGNOSIS — M25551 Pain in right hip: Secondary | ICD-10-CM

## 2019-04-20 HISTORY — DX: Pain in right hip: M25.551

## 2019-04-20 MED ORDER — CYCLOBENZAPRINE HCL 10 MG PO TABS: 10.0000 mg | ORAL_TABLET | Freq: Three times a day (TID) | ORAL | 1 refills | Status: DC | PRN

## 2019-04-20 MED ORDER — VENLAFAXINE HCL ER 75 MG PO CP24: 75.0000 mg | ORAL_CAPSULE | Freq: Every day | ORAL | 1 refills | Status: DC

## 2019-04-20 NOTE — Progress Notes (Signed)
Subjective: Chief Complaint  Patient presents with  . Abdominal Pain     HPI: Marissa Contreras is a 28 y.o. presenting to clinic today to discuss the following:  Right Hip Pain Patient continues to endorse right hip pain that starts in the groin region and radiates laterally to her hip. She states it began when she was pregnant and has continued but since giving birth it "has changed". The pain is described as sharp, intermittent, random onset and stays is non-radiating to any other area. Most of the time described as a 7/10 but does reach a 10/10. If she sits for a long time she notices that brings it on but if she stands to relieve the pain it makes it worse for a "little while". No OTC medications have helped.  She denies fever, rash, abdominal pain, nausea, vomiting, diarrhea, no other joint pain.  ROS noted in HPI.   Past Medical, Surgical, Social, and Family History Reviewed & Updated per EMR.   Pertinent Historical Findings include:   Social History   Tobacco Use  Smoking Status Former Smoker  . Packs/day: 1.00  . Types: Cigarettes  . Quit date: 09/24/2017  . Years since quitting: 1.5  Smokeless Tobacco Never Used  Tobacco Comment   quit for pregnancy    Objective: BP 115/80   Pulse (!) 102   LMP 03/28/2019   SpO2 98%  Vitals and nursing notes reviewed  Physical Exam Gen: Alert and Oriented x 3, NAD HEENT: Normocephalic, atraumatic CV: RRR, no murmurs, normal S1, S2 split Resp: CTAB, no wheezing, rales, or rhonchi, comfortable work of breathing Abd: non-distended, non-tender, soft, +bs in all four quadrants MSK: Hip, right and left: TTP noted at right anatomical hip. No obvious rash, erythema, ecchymosis, or edema. ROM full in all directions (IR: 80/ ER: 80/Flex: 120/Ext: 100/Abd: 45/Add: 45); Strength 5/5 in IR/ER/Flex/Ext/Abd/Add. Pelvic alignment unremarkable to inspection and palpation. Standing hip rotation and gait without unsteadiness. Greater  trochanter without tenderness to palpation. No tenderness over piriformis. No SI joint tenderness and normal minimal SI movement. Negative straight leg raise test bilaterally, negative FADER and FABIR bilaterally Ext: no clubbing, cyanosis, or edema Skin: warm, dry, intact, no rashes  Assessment/Plan:  Right hip pain Continued right hip pain after pregnancy. Initially I thought she was suffering from round ligament pain as her symptoms all fit this picture, especially when she mentioned that it started after her 2nd trimester. However, at this point I would think her pain should have resolved if it was indeed round ligament pain. Her symptoms are not consistent with greater trochanter bursitis. No signs of autoimmune cause such as other joint pain, swelling, or rash. No history of trauma or fall so chance of fracture of any type is very low. Unclear to me what this pain may be from as it also does not fit a sciatica type picture - x-ray of the right hip to ensure no bony deformity or osseous abnormality - referral to sports medicine for right hip ultrasound  PATIENT EDUCATION PROVIDED: See AVS    Diagnosis and plan along with any newly prescribed medication(s) were discussed in detail with this patient today. The patient verbalized understanding and agreed with the plan. Patient advised if symptoms worsen return to clinic or ER.   Health Maintainance:   Orders Placed This Encounter  Procedures  . DG Hip Unilat W OR W/O Pelvis 2-3 Views Right    Standing Status:   Future    Standing  Expiration Date:   07/21/2019    Order Specific Question:   Reason for Exam (SYMPTOM  OR DIAGNOSIS REQUIRED)    Answer:   right hip pain    Order Specific Question:   Is patient pregnant?    Answer:   No    Order Specific Question:   Preferred imaging location?    Answer:   [facility]    Order Specific Question:   Radiology Contrast Protocol - do NOT remove file path    Answer:    [email or web address]  . Ambulatory referral to Sports Medicine    Referral Priority:   Routine    Referral Type:   Consultation    Number of Visits Requested:   1    Meds ordered this encounter  Medications  . cyclobenzaprine (FLEXERIL) 10 MG tablet    Sig: Take 1 tablet (10 mg total) by mouth 3 (three) times daily as needed for muscle spasms.    Dispense:  90 tablet    Refill:  1  . venlafaxine XR (EFFEXOR-XR) 75 MG 24 hr capsule    Sig: Take 1 capsule (75 mg total) by mouth daily with breakfast.    Dispense:  30 capsule    Refill:  [place], DO 04/20/2019, 11:40 AM PGY-3 [facility]

## 2019-04-20 NOTE — Patient Instructions (Signed)
It was great to see you today! Thank you for letting me participate in your care!  Today, we discussed your continued right hip pain. I am concerned that it has not responded to conservative therapy and is persisting over time. I have refilled your cyclobenzaprine and have sent in a referral to Sports Medicine. Please go get x-rays down of your right hip at [facility] as soon as you can. I will call you with results.  Be well, Harolyn Rutherford, DO PGY-3, [facility]

## 2019-04-22 ENCOUNTER — Telehealth: Payer: Self-pay | Admitting: Family Medicine

## 2019-04-22 NOTE — Telephone Encounter (Signed)
Pt is calling for some antibiotics . She said that her doctor told her if she was not any better in a day or two he would call in something for her.

## 2019-04-22 NOTE — Assessment & Plan Note (Signed)
Continued right hip pain after pregnancy. Initially I thought she was suffering from round ligament pain as her symptoms all fit this picture, especially when she mentioned that it started after her 2nd trimester. However, at this point I would think her pain should have resolved if it was indeed round ligament pain. Her symptoms are not consistent with greater trochanter bursitis. No signs of autoimmune cause such as other joint pain, swelling, or rash. No history of trauma or fall so chance of fracture of any type is very low. Unclear to me what this pain may be from as it also does not fit a sciatica type picture - x-ray of the right hip to ensure no bony deformity or osseous abnormality - referral to sports medicine for right hip ultrasound

## 2019-04-27 ENCOUNTER — Other Ambulatory Visit: Payer: Self-pay | Admitting: Family Medicine

## 2019-04-27 ENCOUNTER — Encounter: Payer: Self-pay | Admitting: Family Medicine

## 2019-04-27 MED ORDER — CEPHALEXIN 500 MG PO CAPS: 500.0000 mg | ORAL_CAPSULE | Freq: Four times a day (QID) | ORAL | 0 refills | Status: AC

## 2019-04-27 NOTE — Progress Notes (Signed)
Antibiotic sent in for possible non-purulent cellulitis of the right 5th digit. Found to be red and swollen on exam from recent visit and not improved.

## 2019-04-27 NOTE — Telephone Encounter (Signed)
Patient states that she is requesting antibiotics because at her last visit she was told that if her right finger was still swollen in 2 days from her appointment that she could call Dr. Garlan Fillers to get them sent to her pharmacy.  Ozella Almond, [place]

## 2019-04-27 NOTE — Progress Notes (Signed)
Normal x-ray

## 2019-04-28 NOTE — Telephone Encounter (Signed)
Pt informed. Deseree Blount, [name]  

## 2019-04-29 ENCOUNTER — Ambulatory Visit: Payer: Medicaid Other | Admitting: Family Medicine

## 2019-05-25 ENCOUNTER — Other Ambulatory Visit: Payer: Self-pay

## 2019-05-25 ENCOUNTER — Emergency Department (HOSPITAL_COMMUNITY)
Admission: EM | Admit: 2019-05-25 | Discharge: 2019-05-26 | Disposition: A | Discharge status: Home / Self Care | Payer: Medicaid Other | Attending: Emergency Medicine | Admitting: Emergency Medicine

## 2019-05-25 ENCOUNTER — Encounter (HOSPITAL_COMMUNITY): Payer: Self-pay | Admitting: *Deleted

## 2019-05-25 DIAGNOSIS — Z79899 Other long term (current) drug therapy: Secondary | ICD-10-CM | POA: Diagnosis not present

## 2019-05-25 DIAGNOSIS — K8020 Calculus of gallbladder without cholecystitis without obstruction: Secondary | ICD-10-CM | POA: Insufficient documentation

## 2019-05-25 DIAGNOSIS — R1031 Right lower quadrant pain: Secondary | ICD-10-CM | POA: Diagnosis present

## 2019-05-25 DIAGNOSIS — Z87891 Personal history of nicotine dependence: Secondary | ICD-10-CM | POA: Insufficient documentation

## 2019-05-25 DIAGNOSIS — J45909 Unspecified asthma, uncomplicated: Secondary | ICD-10-CM | POA: Insufficient documentation

## 2019-05-25 MED ORDER — SODIUM CHLORIDE 0.9% FLUSH: 3.0000 mL | Freq: Once | INTRAVENOUS | Status: DC

## 2019-05-25 NOTE — ED Triage Notes (Signed)
Pt c/o lower right abdominal pain that started this am when she woke up; pt has been nauseous the last 2 days

## 2019-05-26 ENCOUNTER — Emergency Department (HOSPITAL_COMMUNITY): Payer: Medicaid Other

## 2019-05-26 LAB — COMPREHENSIVE METABOLIC PANEL
ALT: 17 U/L (ref 0–44)
AST: 13 U/L — ABNORMAL LOW (ref 15–41)
Albumin: 4.1 g/dL (ref 3.5–5.0)
Alkaline Phosphatase: 112 U/L (ref 38–126)
Anion gap: 8 (ref 5–15)
BUN: 14 mg/dL (ref 6–20)
CO2: 25 mmol/L (ref 22–32)
Calcium: 8.8 mg/dL — ABNORMAL LOW (ref 8.9–10.3)
Chloride: 107 mmol/L (ref 98–111)
Creatinine, Ser: 0.92 mg/dL (ref 0.44–1.00)
GFR calc Af Amer: >60 mL/min (ref >60)
GFR calc non Af Amer: >60 mL/min (ref >60)
Glucose, Bld: 99 mg/dL (ref 70–99)
Potassium: 3.6 mmol/L (ref 3.5–5.1)
Sodium: 140 mmol/L (ref 135–145)
Total Bilirubin: <0.1 mg/dL — ABNORMAL LOW (ref 0.3–1.2)
Total Protein: 7.1 g/dL (ref 6.5–8.1)

## 2019-05-26 LAB — CBC WITH DIFFERENTIAL/PLATELET
Abs Immature Granulocytes: 0.02 10*3/uL (ref 0.00–0.07)
Basophils Absolute: 0 10*3/uL (ref 0.0–0.1)
Basophils Relative: 0 %
Eosinophils Absolute: 0 10*3/uL (ref 0.0–0.5)
Eosinophils Relative: 0 %
HCT: 43.1 % (ref 36.0–46.0)
Hemoglobin: 13.6 g/dL (ref 12.0–15.0)
Immature Granulocytes: 0 %
Lymphocytes Relative: 25 %
Lymphs Abs: 2.1 10*3/uL (ref 0.7–4.0)
MCH: 28.7 pg (ref 26.0–34.0)
MCHC: 31.6 g/dL (ref 30.0–36.0)
MCV: 90.9 fL (ref 80.0–100.0)
Monocytes Absolute: 0.6 10*3/uL (ref 0.1–1.0)
Monocytes Relative: 7 %
Neutro Abs: 5.7 10*3/uL (ref 1.7–7.7)
Neutrophils Relative %: 68 %
Platelets: 295 10*3/uL (ref 150–400)
RBC: 4.74 MIL/uL (ref 3.87–5.11)
RDW: 12.6 % (ref 11.5–15.5)
WBC: 8.5 10*3/uL (ref 4.0–10.5)
nRBC: 0 % (ref 0.0–0.2)

## 2019-05-26 LAB — WET PREP, GENITAL
Clue Cells Wet Prep HPF POC: NONE SEEN
Sperm: NONE SEEN
Trich, Wet Prep: NONE SEEN
Yeast Wet Prep HPF POC: NONE SEEN

## 2019-05-26 LAB — URINALYSIS, ROUTINE W REFLEX MICROSCOPIC
Bilirubin Urine: NEGATIVE
Glucose, UA: NEGATIVE mg/dL
Hgb urine dipstick: NEGATIVE
Ketones, ur: NEGATIVE mg/dL
Leukocytes,Ua: NEGATIVE
Nitrite: NEGATIVE
Protein, ur: NEGATIVE mg/dL
Specific Gravity, Urine: 1.026 (ref 1.005–1.030)
pH: 5 (ref 5.0–8.0)

## 2019-05-26 LAB — POC URINE PREG, ED: Preg Test, Ur: NEGATIVE

## 2019-05-26 LAB — LIPASE, BLOOD: Lipase: 32 U/L (ref 11–51)

## 2019-05-26 MED ORDER — IOHEXOL 300 MG/ML  SOLN
100.0000 mL | Freq: Once | INTRAMUSCULAR | Status: AC | PRN
  Administered 2019-05-26: 02:00:00 100 mL via INTRAVENOUS

## 2019-05-26 NOTE — ED Provider Notes (Signed)
[facility] EMERGENCY DEPARTMENT Provider Note   CSN: [id number] Arrival date & time: 05/25/19  2225    History   Chief Complaint Chief Complaint  Patient presents with   Abdominal Pain    HPI Marissa Contreras is a 28 y.o. female.   The history is provided by the patient.  Abdominal Pain She has history of asthma and chronic back pain and comes in complaining of right lower abdominal pain which started about 9 AM.  Pain is dull and crampy and has been getting worse through the day.  She currently rates pain at 8/10.  Pain is worse with movement, better with laying still.  She denies nausea or vomiting.  She denies fever, chills, sweats.  She denies any urinary difficulty.  She denies any vaginal discharge.  She denies constipation or diarrhea.  She has not taken anything for pain.  She is currently breast-feeding, her child is 42 months old.  Her last menses was July 5, but her menses have been irregular while breast-feeding.  She is status post tubal ligation.  Past Medical History:  Diagnosis Date   Anxiety    Asthma    Breastfeeding (infant)    Chronic back pain    Depression    H/O varicella    Headache    Menorrhagia 08/19/2016   UTI (urinary tract infection) 09/09/2017    Patient Active Problem List   Diagnosis Date Noted   Right hip pain 04/20/2019   Neck pain 09/28/2018   PROM (premature rupture of membranes) 05/09/2018   Vaginal delivery 05/09/2018   Positive GBS test 04/18/2018   Migraine 06/03/2017   Mild intermittent asthma 12/20/2014   Bilateral hip pain 12/08/2014   Supervision of other normal pregnancy, antepartum 01/11/2014   Depression 12/20/2013   Back pain 02/11/2012    Past Surgical History:  Procedure Laterality Date   DILATION AND CURETTAGE OF UTERUS     DILATION AND EVACUATION N/A 03/08/2014   Procedure: DILATATION AND EVACUATION ;  Surgeon: Delice Lesch, MD;  Location: [facility];  Service: Gynecology;  Laterality: N/A;   with chromosomal studies, sent    TONSILLECTOMY     TUBAL LIGATION Bilateral 05/10/2018   Procedure: POST PARTUM TUBAL LIGATION;  Surgeon: Woodroe Mode, MD;  Location: [facility];  Service: Gynecology;  Laterality: Bilateral;     OB History    Gravida  7   Para  3   Term  3   Preterm      AB  4   Living  3     SAB  4   TAB      Ectopic      Multiple  0   Live Births  3            Home Medications    Prior to Admission medications   Medication Sig Start Date End Date Taking? Authorizing Provider  acetaminophen (TYLENOL) 325 MG tablet Take 2 tablets (650 mg total) by mouth every 4 (four) hours as needed (for pain scale < 4). 05/11/18   Darlina Rumpf, CNM  albuterol (PROVENTIL HFA;[name] 108 (90 Base) MCG/ACT inhaler Inhale 2 puffs into the lungs every 6 (six) hours as needed for wheezing or shortness of breath. 08/29/18   Tereasa Coop, PA-C  cyclobenzaprine (FLEXERIL) 10 MG tablet Take 1 tablet (10 mg total) by mouth 3 (three) times daily as needed for muscle spasms. 04/15/19   Nuala Alpha, DO  gabapentin (NEURONTIN) 300 MG  capsule TAKE 1 CAPSULE BY MOUTH THREE TIMES A DAY 01/24/19   Lockamy, Timothy, DO  ibuprofen (ADVIL,MOTRIN) 600 MG tablet Take 1 tablet (600 mg total) by mouth every 6 (six) hours. 05/11/18   Calvert Cantor, CNM  venlafaxine XR (EFFEXOR-XR) 75 MG 24 hr capsule Take 1 capsule (75 mg total) by mouth daily with breakfast. 04/15/19   Arlyce Harman, DO    Family History Family History  Problem Relation Age of Onset   Hypertension Father    Diabetes Father    Fibromyalgia Mother    Arthritis Mother    Depression Mother    Diabetes Paternal Grandmother    Cancer Paternal Grandmother    Stroke Paternal Grandmother     Social History Social History   Tobacco Use   Smoking status: Former Smoker    Packs/day: 1.00    Types: Cigarettes    Quit date: 09/24/2017    Years since quitting: 1.6    Smokeless tobacco: Never Used   Tobacco comment: quit for pregnancy  Substance Use Topics   Alcohol use: No   Drug use: No     Allergies   Patient has no known allergies.   Review of Systems Review of Systems  Gastrointestinal: Positive for abdominal pain.  All other systems reviewed and are negative.    Physical Exam Updated Vital Signs BP (!) 127/91 (BP Location: Right Arm)    Pulse (!) 111    Temp 98.1 F (36.7 C) (Oral)    Resp 20    Ht 5\' 7"  (1.702 m)    Wt 70.8 kg    LMP 03/28/2019    SpO2 100%    BMI 24.43 kg/m   Physical Exam Vitals signs and nursing note reviewed.    28 year old female, resting comfortably and in no acute distress. Vital signs are significant for borderline elevated blood pressure and mildly increased heart rate. Oxygen saturation is 100%, which is normal. Head is normocephalic and atraumatic. PERRLA, EOMI. Oropharynx is clear. Neck is nontender and supple without adenopathy or JVD. Back is nontender and there is no CVA tenderness. Lungs are clear without rales, wheezes, or rhonchi. Chest is nontender. Heart has regular rate and rhythm without murmur. Abdomen is soft, flat, with moderate right lower quadrant tenderness.  There is no rebound or guarding.  There are no or hepatosplenomegaly and peristalsis is normoactive. Pelvic: Normal external female genitalia.  Cervix is closed.  No bleeding or discharge.  On bimanual examination, fundus is normal size and position and nontender.  No cervical motion tenderness.  There is minimal soreness in the right adnexa without any masses, left adnexa is nontender without any masses. Extremities have no cyanosis or edema, full range of motion is present. Skin is warm and dry without rash. Neurologic: Mental status is normal, cranial nerves are intact, there are no motor or sensory deficits.  ED Treatments / Results  Labs (all labs ordered are listed, but only abnormal results are displayed) Labs Reviewed    WET PREP, GENITAL - Abnormal; Notable for the following components:      Result Value   WBC, Wet Prep HPF POC FEW (*)    All other components within normal limits  COMPREHENSIVE METABOLIC PANEL - Abnormal; Notable for the following components:   Calcium 8.8 (*)    AST 13 (*)    Total Bilirubin <0.1 (*)    All other components within normal limits  LIPASE, BLOOD  URINALYSIS, ROUTINE W REFLEX MICROSCOPIC  CBC WITH DIFFERENTIAL/PLATELET  RPR  HIV ANTIBODY (ROUTINE TESTING W REFLEX)  POC URINE PREG, ED  GC/CHLAMYDIA PROBE AMP ([place]   Radiology Ct Abdomen Pelvis W Contrast  Result Date: 05/26/2019 CLINICAL DATA:  Right lower abdominal pain. EXAM: CT ABDOMEN AND PELVIS WITH CONTRAST TECHNIQUE: Multidetector CT imaging of the abdomen and pelvis was performed using the standard protocol following bolus administration of intravenous contrast. CONTRAST:  100mL OMNIPAQUE IOHEXOL 300 MG/ML  SOLN COMPARISON:  None. FINDINGS: Lower chest: The lung bases are clear. Hepatobiliary: No focal hepatic abnormality. Calcified gallstone without pericholecystic inflammation. Small Phrygian cap. No biliary dilatation. Pancreas: No ductal dilatation or inflammation. Spleen: Normal in size without focal abnormality. Adrenals/Urinary Tract: Normal adrenal glands. No hydronephrosis or perinephric edema. Homogeneous renal enhancement. Urinary bladder is physiologically distended without wall thickening. Stomach/Bowel: Stomach is within normal limits. Appendix appears normal, series 2, image 66. No evidence of bowel wall thickening, distention, or inflammatory changes. Moderate volume of stool throughout the colon. Vascular/Lymphatic: No significant vascular findings are present. No enlarged abdominal or pelvic lymph nodes. Reproductive: Retroverted uterus. Bilateral tubal ligation clips. No adnexal mass. Other: No free air, free fluid, or intra-abdominal fluid collection. Tiny fat containing umbilical  hernia. Musculoskeletal: There are no acute or suspicious osseous abnormalities. IMPRESSION: 1. No acute abnormality.  Normal appendix. 2. Gallstone without gallbladder inflammation. Electronically Signed   By: Narda RutherfordMelanie  Sanford M.D.   On: 05/26/2019 02:20    Procedures Procedures   Medications Ordered in ED Medications  sodium chloride flush (NS) 0.9 % injection 3 mL (has no administration in time range)     Initial Impression / Assessment and Plan / ED Course  I have reviewed the triage vital signs and the nursing notes.  Pertinent labs & imaging results that were available during my care of the patient were reviewed by me and considered in my medical decision making (see chart for details).  Right lower quadrant pain, possible mittelschmerz, doubt appendicitis, doubt UTI, doubt diverticulitis, doubt urolithiasis.  Ectopic pregnancy following tubal ligation is possible, but not felt to be likely.  Will check screening labs.  Old records are reviewed confirming term delivery on May 09, 2018 with delivery complicated by premature rupture of membranes.  No prior abdominal CT scans.  Pelvic exam does not seem consistent with mittelschmerz.  Will send for CT of abdomen and pelvis.  Labs are unremarkable.  CT of abdomen and pelvis showed evidence of cholelithiasis, but no other significant pathology and no cause for her pain identified on CT scan.  Patient is advised of these findings and is discharged with instructions to use over-the-counter analgesics as needed, return if symptoms worsen.  Final Clinical Impressions(s) / ED Diagnoses   Final diagnoses:  RLQ abdominal pain  Calculus of gallbladder without cholecystitis without obstruction    ED Discharge Orders    None       Dione BoozeGlick, Oluwakemi Salsberry, MD 05/26/19 [phone]

## 2019-05-26 NOTE — Discharge Instructions (Addendum)
Your evaluation did not show the cause of your pain, but did not find anything serious.  Take acetaminophen and/or ibuprofen as needed for pain.  Return if your symptoms are getting worse.

## 2019-05-27 LAB — RPR: RPR Ser Ql: NONREACTIVE

## 2019-05-27 LAB — GC/CHLAMYDIA PROBE AMP (~~LOC~~) NOT AT ARMC
Chlamydia: NEGATIVE
Neisseria Gonorrhea: NEGATIVE

## 2019-05-27 LAB — HIV ANTIBODY (ROUTINE TESTING W REFLEX): HIV Screen 4th Generation wRfx: NONREACTIVE

## 2019-06-06 ENCOUNTER — Ambulatory Visit (INDEPENDENT_AMBULATORY_CARE_PROVIDER_SITE_OTHER): Payer: Medicaid Other | Admitting: Family Medicine

## 2019-06-06 ENCOUNTER — Other Ambulatory Visit: Payer: Self-pay

## 2019-06-06 ENCOUNTER — Encounter: Payer: Self-pay | Admitting: Family Medicine

## 2019-06-06 VITALS — BP 115/80 | HR 91

## 2019-06-06 DIAGNOSIS — K8020 Calculus of gallbladder without cholecystitis without obstruction: Secondary | ICD-10-CM

## 2019-06-06 MED ORDER — DOXYLAMINE-PYRIDOXINE 10-10 MG PO TBEC: 1.0000 | DELAYED_RELEASE_TABLET | Freq: Two times a day (BID) | ORAL | 0 refills | Status: DC

## 2019-06-06 MED ORDER — VENLAFAXINE HCL ER 37.5 MG PO CP24: 112.5000 mg | ORAL_CAPSULE | Freq: Every day | ORAL | 1 refills | Status: DC

## 2019-06-06 NOTE — Progress Notes (Signed)
     Subjective: Chief Complaint  Patient presents with  . Hip Pain    right side    HPI: Marissa Contreras is a 28 y.o. presenting to clinic today to discuss the following:  Right Sided Abdominal Pain Patient recently seen in the ED for right sided abdominal pain and found to have a gall stone on CT scan of her abdomen. Began around two weeks ago and has slowly gotten worse. She is tolerating solids and liquids. No fever, chills, vomiting but does have nausea and some diarrhea. The pain is worse with a meal and radiates to her right hip. Her lab work up in the ED was negative for elevated AST/ALT or bilirubin.     ROS noted in HPI.    Social History   Tobacco Use  Smoking Status Former Smoker  . Packs/day: 1.00  . Types: Cigarettes  . Quit date: 09/24/2017  . Years since quitting: 1.6  Smokeless Tobacco Never Used  Tobacco Comment   quit for pregnancy    Objective: BP 115/80   Pulse 91   LMP 03/28/2019 (Exact Date)   SpO2 100%  Vitals and nursing notes reviewed  Physical Exam Gen: Alert and Oriented x 3, NAD CV: RRR, no murmurs, normal S1, S2 split Resp: CTAB, no wheezing, rales, or rhonchi, comfortable work of breathing Abd: non-distended, tender to palpation in RUQ, no rebound or guarding, positive Murphy sign, soft, +bs in all four quadrants Ext: no clubbing, cyanosis, or edema Skin: warm, dry, intact, no rashes  Assessment/Plan:  Cholelithiasis Patient with symptomatic cholelithiasis found on abdominal CT during ED visit on 9/3. - Referral to general surgery for cholecystectomy. - Diclegis for nausea given she is breast feeding. If that does not help can give reglan.   PATIENT EDUCATION PROVIDED: See AVS    Diagnosis and plan along with any newly prescribed medication(s) were discussed in detail with this patient today. The patient verbalized understanding and agreed with the plan. Patient advised if symptoms worsen return to clinic or ER.    Orders Placed This  Encounter  Procedures  . Ambulatory referral to General Surgery    Referral Priority:   Routine    Referral Type:   Surgical    Referral Reason:   Specialty Services Required    Requested Specialty:   General Surgery    Number of Visits Requested:   1    Meds ordered this encounter  Medications  . DISCONTD: venlafaxine XR (EFFEXOR-XR) 37.5 MG 24 hr capsule    Sig: Take 3 capsules (112.5 mg total) by mouth daily with breakfast.    Dispense:  90 capsule    Refill:  1  . Doxylamine-Pyridoxine (DICLEGIS) 10-10 MG TBEC    Sig: Take 1 Dose by mouth 2 (two) times daily.    Dispense:  60 tablet    Refill:  0     Harolyn Rutherford, DO 06/06/2019, 4:21 PM PGY-3 [place]

## 2019-06-06 NOTE — Patient Instructions (Addendum)
It was great to see you today! Thank you for letting me participate in your care!  Today, we discussed your abdominal pain. I have referred you to surgery because the definitive treatment is to have the gallbladder removed.  I written you a prescription for diclegis for the nausea. Please take it as prescribed.  Be well, Harolyn Rutherford, DO PGY-3, [facility]   Cholelithiasis  Cholelithiasis is also called "gallstones." It is a kind of gallbladder disease. The gallbladder is an organ that stores a liquid (bile) that helps you digest fat. Gallstones may not cause symptoms (may be silent gallstones) until they cause a blockage, and then they can cause pain (gallbladder attack). Follow these instructions at home:  Take over-the-counter and prescription medicines only as told by your doctor.  Stay at a healthy weight.  Eat healthy foods. This includes: ? Eating fewer fatty foods, like fried foods. ? Eating fewer refined carbs (refined carbohydrates). Refined carbs are breads and grains that are highly processed, like white bread and white rice. Instead, choose whole grains like whole-wheat bread and brown rice. ? Eating more fiber. Almonds, fresh fruit, and beans are healthy sources of fiber.  Keep all follow-up visits as told by your doctor. This is important. Contact a doctor if:  You have sudden pain in the upper right side of your belly (abdomen). Pain might spread to your right shoulder or your chest. This may be a sign of a gallbladder attack.  You feel sick to your stomach (are nauseous).  You throw up (vomit).  You have been diagnosed with gallstones that have no symptoms and you get: ? Belly pain. ? Discomfort, burning, or fullness in the upper part of your belly (indigestion). Get help right away if:  You have sudden pain in the upper right side of your belly, and it lasts for more than 2 hours.  You have belly pain that lasts for more than 5 hours.  You  have a fever or chills.  You keep feeling sick to your stomach or you keep throwing up.  Your skin or the whites of your eyes turn yellow (jaundice).  You have dark-colored pee (urine).  You have light-colored poop (stool). Summary  Cholelithiasis is also called "gallstones."  The gallbladder is an organ that stores a liquid (bile) that helps you digest fat.  Silent gallstones are gallstones that do not cause symptoms.  A gallbladder attack may cause sudden pain in the upper right side of your belly. Pain might spread to your right shoulder or your chest. If this happens, contact your doctor.  If you have sudden pain in the upper right side of your belly that lasts for more than 2 hours, get help right away. This information is not intended to replace advice given to you by your health care provider. Make sure you discuss any questions you have with your health care provider. Document Released: 02/25/2008 Document Revised: 08/21/2017 Document Reviewed: 05/25/2016 Elsevier Patient Education  2020 [employer].

## 2019-06-08 DIAGNOSIS — K8020 Calculus of gallbladder without cholecystitis without obstruction: Secondary | ICD-10-CM | POA: Insufficient documentation

## 2019-06-08 NOTE — Assessment & Plan Note (Signed)
Patient with symptomatic cholelithiasis found on abdominal CT during ED visit on 9/3. - Referral to general surgery for cholecystectomy. - Diclegis for nausea given she is breast feeding. If that does not help can give reglan.

## 2019-06-24 ENCOUNTER — Other Ambulatory Visit: Payer: Self-pay | Admitting: Family Medicine

## 2019-07-04 ENCOUNTER — Ambulatory Visit: Payer: Self-pay | Admitting: General Surgery

## 2019-07-04 NOTE — H&P (Signed)
History of Present Illness Marissa Filler MD; 07/04/2019 10:55 AM) The patient is a 28 year old female who presents for evaluation of gall stones. Referred by: Dr. Arlyce Harman Chief Complaint: Gallstones  Patient is a 28 year old female with no past medical history. Patient was recently seen in the ER on September 3 secondary to abdominal pain. Patient states the pain was the right upper/lower quadrant. She states this was his it was a nausea or emesis. She also states that she has some diarrhea. Patient underwent CT scan which was significant for gallstones however no cholecystitis. I did review this personally. Patient states that since that time the patient is been constant. She states that she several low fat, bland diet which is still causing some pain. Patient denied any increase in her pain recently.  Patients has had a BTL.    Past Surgical History Marissa Contreras, [name]; 07/04/2019 10:31 AM) Tonsillectomy   Diagnostic Studies History Marissa Contreras, [place]; 07/04/2019 10:31 AM) Colonoscopy  never Mammogram  never Pap Smear  1-5 years ago  Allergies Marissa Contreras, [name]; 07/04/2019 10:32 AM) No Known Drug Allergies  [07/04/2019]: Allergies Reconciled   Medication History Marissa Contreras, [name]; 07/04/2019 10:34 AM) Effexor (100MG  Tablet, Oral) Active. Flexeril (5MG  Tablet, Oral) Active. Gabapentin (100MG  Capsule, Oral) Active. Medications Reconciled  Social History , [name]; 07/04/2019 10:31 AM) Caffeine use  Carbonated beverages, Tea. No alcohol use  No drug use  Tobacco use  Current some day smoker.  Family History , Marissa Contreras; 07/04/2019 10:31 AM) Arthritis  Brother, Father, Mother. Breast Cancer  Family Members In General. Colon Cancer  Family Members In General. Hypertension  Father. Migraine Headache  Brother, Father, Mother, Sister. Rectal Cancer  Family Members In General.  Pregnancy / Birth History Marissa Contreras, [name];  07/04/2019 10:31 AM) Age at menarche  12 years. Gravida  7 Irregular periods  Length (months) of breastfeeding  12-24 Maternal age  68-25 Para  3  Other Problems 09/03/2019, [name]; 07/04/2019 10:31 AM) Anxiety Disorder  Asthma  Back Pain  Cholelithiasis  Depression  Migraine Headache  Oophorectomy     Review of Systems 23-34 MD; 07/04/2019 10:53 AM) General Not Present- Appetite Loss, Chills, Fatigue, Fever, Night Sweats, Weight Gain and Weight Loss. Skin Not Present- Change in Wart/Mole, Dryness, Hives, Jaundice, New Lesions, Non-Healing Wounds, Rash and Ulcer. HEENT Present- Wears glasses/contact lenses. Not Present- Earache, Hearing Loss, Hoarseness, Nose Bleed, Oral Ulcers, Ringing in the Ears, Seasonal Allergies, Sinus Pain, Sore Throat, Visual Disturbances and Yellow Eyes. Respiratory Not Present- Bloody sputum, Chronic Cough, Difficulty Breathing, Snoring and Wheezing. Breast Not Present- Breast Mass, Breast Pain, Nipple Discharge and Skin Changes. Cardiovascular Not Present- Chest Pain. Gastrointestinal Present- Abdominal Pain, Bloating, Change in Bowel Habits, Hemorrhoids and Nausea. Not Present- Bloody Stool, Chronic diarrhea, Constipation, Difficulty Swallowing, Excessive gas, Gets full quickly at meals, Indigestion, Rectal Pain and Vomiting. Female Genitourinary Not Present- Frequency, Nocturia, Painful Urination, Pelvic Pain and Urgency. Musculoskeletal Present- Back Pain, Joint Pain, Joint Stiffness, Muscle Pain and Muscle Weakness. Not Present- Swelling of Extremities. Neurological Present- Headaches. Not Present- Decreased Memory, Fainting, Numbness, Seizures, Tingling, Tremor, Trouble walking and Weakness. Psychiatric Present- Anxiety and Depression. Not Present- Bipolar, Change in Sleep Pattern, Fearful and Frequent crying. All other systems negative  Vitals 09/03/2019 [name]; 07/04/2019 10:35 AM) 07/04/2019 10:35 AM Weight: 172.4 lb  Height: 66in Body Surface Area: 1.88 m Body Mass Index: 27.83 kg/m  Temp.: 98.25F  Pulse: 110 (Regular)  BP: 120/88(Sitting, Left Arm,  Standard)       Physical Exam [name] MD; 07/04/2019 10:55 AM) The physical exam findings are as follows: Note: Constitutional: No acute distress, conversant, appears stated age  Eyes: Anicteric sclerae, moist conjunctiva, no lid lag  Neck: No thyromegaly, trachea midline, no cervical lymphadenopathy  Lungs: Clear to auscultation biilaterally, normal respiratory effot  Cardiovascular: regular rate & rhythm, no murmurs, no peripheal edema, pedal pulses 2+  GI: Soft, no masses or hepatosplenomegaly, non-tender to palpation  MSK: Normal gait, no clubbing cyanosis, edema  Skin: No rashes, palpation reveals normal skin turgor  Psychiatric: Appropriate judgment and insight, oriented to person, place, and time    Assessment & Plan [name] MD; 07/04/2019 10:55 AM)  SYMPTOMATIC CHOLELITHIASIS (K80.20) Impression: 28 year old female with symptomatic cholelithiasis 1. We will proceed to the operating room for a laparoscopic cholecystectomy  2. Risks and benefits were discussed with the patient to generally include, but not limited to: infection, bleeding, possible need for post op ERCP, damage to the bile ducts, bile leak, and possible need for further surgery. Alternatives were offered and described. All questions were answered and the patient voiced understanding of the procedure and wishes to proceed at this point with a laparoscopic cholecystectomy

## 2019-07-26 ENCOUNTER — Other Ambulatory Visit: Payer: Self-pay | Admitting: *Deleted

## 2019-07-26 ENCOUNTER — Other Ambulatory Visit: Payer: Self-pay | Admitting: General Surgery

## 2019-07-26 MED ORDER — CYCLOBENZAPRINE HCL 10 MG PO TABS: ORAL_TABLET | ORAL | 1 refills | Status: DC

## 2019-08-11 ENCOUNTER — Other Ambulatory Visit: Payer: Self-pay | Admitting: Family Medicine

## 2019-08-11 ENCOUNTER — Telehealth: Payer: Self-pay

## 2019-08-11 MED ORDER — VENLAFAXINE HCL ER 37.5 MG PO CP24: 37.5000 mg | ORAL_CAPSULE | Freq: Every day | ORAL | 1 refills | Status: DC

## 2019-08-11 NOTE — Telephone Encounter (Signed)
LVM for patient to call clinic concerning RX.    Marissa Contreras, [place]

## 2019-08-11 NOTE — Progress Notes (Signed)
Patient has had Venlafaxine before but wanted to stop taking it. She has called and requested a restart, which is not unreasonable. Counseled her on risk during lactation.

## 2019-08-26 ENCOUNTER — Telehealth: Payer: Self-pay | Admitting: *Deleted

## 2019-08-26 NOTE — Telephone Encounter (Signed)
Pt called back to check status.  MD paged.  Christen Bame, [name]

## 2019-08-26 NOTE — Telephone Encounter (Signed)
Patient states that previously she was on 3 capsules daily and that's how she was talking the medication.  She was only given 30 pills so now she only has enough till Sunday.  To PCP to advise. Christen Bame, [name]

## 2019-08-29 ENCOUNTER — Other Ambulatory Visit: Payer: Self-pay | Admitting: Family Medicine

## 2019-08-29 MED ORDER — VENLAFAXINE HCL ER 37.5 MG PO CP24: 125.0000 mg | ORAL_CAPSULE | Freq: Every day | ORAL | 0 refills | Status: DC

## 2019-08-29 NOTE — Telephone Encounter (Signed)
Pt informed and appt made for 09/25/18. Christen Bame, [name]

## 2019-08-29 NOTE — Progress Notes (Signed)
Patient was last prescribed 37.5mg  tablets and was told to taper up to 75mg . She has called for a refill stating she has been taking three per day but is now out.  I have refilled this for a one time dose of 112.5mg  per day and it will not refilled until she presents for an appointment.  If she is not well controlled at next visit I will send her to Psych for better management and she has been off and on several medications and does not seem to be having a good response.

## 2019-08-29 NOTE — Telephone Encounter (Signed)
So sorry, its for the effexor.  Christen Bame, [name]

## 2019-08-29 NOTE — Telephone Encounter (Signed)
Understood.  Unfortunately, my understanding was that the patient has already been taking 3/day from the new script and now she is out.  Will likely benefit with call from you as she has been taking that much for the past 10 days.  Christen Bame, [name]

## 2019-09-21 ENCOUNTER — Other Ambulatory Visit: Payer: Self-pay | Admitting: Family Medicine

## 2019-09-26 ENCOUNTER — Ambulatory Visit (INDEPENDENT_AMBULATORY_CARE_PROVIDER_SITE_OTHER): Payer: Medicaid Other | Admitting: Family Medicine

## 2019-09-26 ENCOUNTER — Other Ambulatory Visit: Payer: Self-pay

## 2019-09-26 ENCOUNTER — Encounter: Payer: Self-pay | Admitting: Family Medicine

## 2019-09-26 VITALS — BP 110/80 | HR 117 | Wt 167.6 lb

## 2019-09-26 DIAGNOSIS — F331 Major depressive disorder, recurrent, moderate: Secondary | ICD-10-CM

## 2019-09-26 DIAGNOSIS — R5383 Other fatigue: Secondary | ICD-10-CM | POA: Insufficient documentation

## 2019-09-26 MED ORDER — DOXYLAMINE-PYRIDOXINE 10-10 MG PO TBEC: 1.0000 | DELAYED_RELEASE_TABLET | Freq: Two times a day (BID) | ORAL | 0 refills | Status: DC

## 2019-09-26 NOTE — Assessment & Plan Note (Signed)
Improved with PHQ-9 score of 6 today. - Cont Effexor 117.5mg  daily. - Follow up in 6 months or sooner if needed

## 2019-09-26 NOTE — Progress Notes (Signed)
     Subjective: No chief complaint on file.  HPI: Marissa Contreras is a 29 y.o. presenting to clinic today to discuss the following:  Depression and Fatigue Patient has been on and off several medications to manage her depression. She was started on Venlafaxine after sertraline "did not help". Venlafaxine was tried by her before and she stated she got a good response. She has had a strange refill pattern as I originally prescribed it for 37.5mg  to be titrated up to 75mg  daily. She quit taking it for several months and then requested a refill. I started her back at 37.5mg  and then got a message a month later that she was taking 3 per day. Refilled for one month and patient presents today stating she feels much better and her only symptom is a continued lack of energy. She does not exercise regularly. PHQ-9 score of 6 today. No SI or HI.  ROS noted in HPI.    Social History   Tobacco Use  Smoking Status Former Smoker  . Packs/day: 1.00  . Types: Cigarettes  . Quit date: 09/24/2017  . Years since quitting: 2.0  Smokeless Tobacco Never Used  Tobacco Comment   quit for pregnancy    Objective: BP 110/80   Pulse (!) 117   Wt 167 lb 9.6 oz (76 kg)   SpO2 99%   BMI 26.25 kg/m  Vitals and nursing notes reviewed  Physical Exam Gen: Alert and Oriented x 3, NAD CV: RRR, no murmurs, normal S1, S2 split Resp: CTAB, no wheezing, rales, or rhonchi, comfortable work of breathing Ext: no clubbing, cyanosis, or edema Skin: warm, dry, intact, no rashes Psych: appropriate mood and exhibits good judgement  Assessment/Plan:  Depression Improved with PHQ-9 score of 6 today. - Cont Effexor 117.5mg  daily. - Follow up in 6 months or sooner if needed  Fatigue Patient has had improvement in other depression symptoms except lack of energy. Will order labs to rule out organic causes - CBC, TSH, Free T4 and T3. - Iron panel   PATIENT EDUCATION PROVIDED: See AVS    Diagnosis and plan along with  any newly prescribed medication(s) were discussed in detail with this patient today. The patient verbalized understanding and agreed with the plan. Patient advised if symptoms worsen return to clinic or ER.    Orders Placed This Encounter  Procedures  . CBC  . TSH  . T3, Free  . T4, Free  . Iron, TIBC and Ferritin Panel    Meds ordered this encounter  Medications  . Doxylamine-Pyridoxine (DICLEGIS) 10-10 MG TBEC    Sig: Take 1 Dose by mouth 2 (two) times daily.    Dispense:  60 tablet    Refill:  0   Tim 11/22/2017, DO 09/26/2019, 1:40 PM PGY-3 [facility]

## 2019-09-26 NOTE — Patient Instructions (Signed)
It was great to see you today! Thank you for letting me participate in your care!  Today, we discussed your depression which is being well controlled with your current dose of Effexor. Please continue taking it as prescribed. If you think you need more please call me so we can discuss it first.  Be well, Jules Schick, DO PGY-3, [name]

## 2019-09-26 NOTE — Assessment & Plan Note (Signed)
Patient has had improvement in other depression symptoms except lack of energy. Will order labs to rule out organic causes - CBC, TSH, Free T4 and T3. - Iron panel

## 2019-09-30 ENCOUNTER — Telehealth: Payer: Self-pay | Admitting: Family Medicine

## 2019-09-30 DIAGNOSIS — F3341 Major depressive disorder, recurrent, in partial remission: Secondary | ICD-10-CM

## 2019-09-30 MED ORDER — VENLAFAXINE HCL ER 37.5 MG PO CP24: 125.0000 mg | ORAL_CAPSULE | Freq: Every day | ORAL | 0 refills | Status: DC

## 2019-09-30 NOTE — Telephone Encounter (Signed)
**  After-hours emergency call**  Clinic closed afternoon due to inclement weather.  She met with her PCP in the office this morning for follow-up of depression, stated her Effexor will be sent in however her pharmacy has not received it.  She is hoping to get this sent to [facility] today.  Reviewed office note from today, recommended continuing her Effexor as prescribed.  Will send in 1 refill with further refills per her primary.  Routed to PCP.  Patriciaann Clan, DO

## 2019-10-17 ENCOUNTER — Other Ambulatory Visit: Payer: Self-pay | Admitting: *Deleted

## 2019-10-17 MED ORDER — CYCLOBENZAPRINE HCL 10 MG PO TABS: ORAL_TABLET | ORAL | 1 refills | Status: DC

## 2019-11-01 ENCOUNTER — Telehealth: Payer: Self-pay | Admitting: Family Medicine

## 2019-11-01 NOTE — Telephone Encounter (Signed)
Mother called stating daughter is out of her antidepressant med.for several days. Can you call in refill for several months? Pharmacist has tried to call. VB

## 2019-11-01 NOTE — Telephone Encounter (Signed)
Patient calling for a refill on her Effexor. Patient says her pharmacy has been requesting this for 4 days, but I told patient it doesn't look like we have gotten any requests from the pharmacy for this. Patient says she has been out for a few days now and is starting to not feel well. Please advise.

## 2019-11-02 ENCOUNTER — Other Ambulatory Visit: Payer: Self-pay | Admitting: Family Medicine

## 2019-11-02 DIAGNOSIS — F3341 Major depressive disorder, recurrent, in partial remission: Secondary | ICD-10-CM

## 2019-11-02 MED ORDER — VENLAFAXINE HCL ER 37.5 MG PO CP24: 125.0000 mg | ORAL_CAPSULE | Freq: Every day | ORAL | 0 refills | Status: DC

## 2019-11-02 NOTE — Progress Notes (Signed)
Effexor refilled per patient request 

## 2019-11-09 ENCOUNTER — Other Ambulatory Visit: Payer: Self-pay

## 2019-11-09 MED ORDER — GABAPENTIN 300 MG PO CAPS: ORAL_CAPSULE | ORAL | 2 refills | Status: DC

## 2019-12-01 ENCOUNTER — Encounter: Payer: Self-pay | Admitting: Family Medicine

## 2019-12-01 ENCOUNTER — Other Ambulatory Visit: Payer: Self-pay | Admitting: Family Medicine

## 2019-12-01 DIAGNOSIS — F3341 Major depressive disorder, recurrent, in partial remission: Secondary | ICD-10-CM

## 2019-12-02 MED ORDER — VENLAFAXINE HCL ER 37.5 MG PO CP24: 125.0000 mg | ORAL_CAPSULE | Freq: Every day | ORAL | 2 refills | Status: DC

## 2019-12-02 MED ORDER — DOXYLAMINE-PYRIDOXINE 10-10 MG PO TBEC: 1.0000 | DELAYED_RELEASE_TABLET | Freq: Two times a day (BID) | ORAL | 0 refills | Status: DC

## 2019-12-15 ENCOUNTER — Other Ambulatory Visit: Payer: Self-pay | Admitting: Family Medicine

## 2019-12-21 ENCOUNTER — Telehealth: Payer: Self-pay

## 2019-12-21 NOTE — Telephone Encounter (Signed)
Received fax from pharmacy for PA on Doxylamine-Pyridoxine. See list of alternatives.

## 2019-12-23 ENCOUNTER — Encounter: Payer: Self-pay | Admitting: Family Medicine

## 2019-12-25 ENCOUNTER — Other Ambulatory Visit: Payer: Self-pay | Admitting: Family Medicine

## 2019-12-25 DIAGNOSIS — F3341 Major depressive disorder, recurrent, in partial remission: Secondary | ICD-10-CM

## 2020-01-08 ENCOUNTER — Encounter: Payer: Self-pay | Admitting: Family Medicine

## 2020-01-11 ENCOUNTER — Other Ambulatory Visit: Payer: Self-pay | Admitting: Family Medicine

## 2020-01-12 ENCOUNTER — Other Ambulatory Visit: Payer: Self-pay

## 2020-01-12 ENCOUNTER — Encounter (HOSPITAL_COMMUNITY): Payer: Self-pay

## 2020-01-12 ENCOUNTER — Ambulatory Visit (HOSPITAL_COMMUNITY)
Admission: EM | Admit: 2020-01-12 | Discharge: 2020-01-12 | Disposition: A | Discharge status: Home / Self Care | Payer: Medicaid Other | Attending: Family Medicine | Admitting: Family Medicine

## 2020-01-12 DIAGNOSIS — Z3202 Encounter for pregnancy test, result negative: Secondary | ICD-10-CM

## 2020-01-12 DIAGNOSIS — M545 Low back pain, unspecified: Secondary | ICD-10-CM

## 2020-01-12 DIAGNOSIS — T148XXA Other injury of unspecified body region, initial encounter: Secondary | ICD-10-CM | POA: Diagnosis not present

## 2020-01-12 LAB — POCT URINALYSIS DIP (DEVICE)
Bilirubin Urine: NEGATIVE
Glucose, UA: NEGATIVE mg/dL
Hgb urine dipstick: NEGATIVE
Ketones, ur: NEGATIVE mg/dL
Leukocytes,Ua: NEGATIVE
Nitrite: NEGATIVE
Protein, ur: NEGATIVE mg/dL
Specific Gravity, Urine: 1.02 (ref 1.005–1.030)
Urobilinogen, UA: 0.2 mg/dL (ref 0.0–1.0)
pH: 7 (ref 5.0–8.0)

## 2020-01-12 LAB — POCT PREGNANCY, URINE: Preg Test, Ur: NEGATIVE

## 2020-01-12 LAB — POC URINE PREG, ED: Preg Test, Ur: NEGATIVE

## 2020-01-12 MED ORDER — KETOROLAC TROMETHAMINE 30 MG/ML IJ SOLN
30.0000 mg | Freq: Once | INTRAMUSCULAR | Status: AC
  Administered 2020-01-12: 30 mg via INTRAMUSCULAR

## 2020-01-12 MED ORDER — KETOROLAC TROMETHAMINE 30 MG/ML IJ SOLN
INTRAMUSCULAR | Status: AC
  Filled 2020-01-12: qty 1

## 2020-01-12 MED ORDER — PREDNISONE 10 MG (21) PO TBPK: ORAL_TABLET | Freq: Every day | ORAL | 0 refills | Status: AC

## 2020-01-12 NOTE — Discharge Instructions (Addendum)
Take the prescribed ibuprofen as needed for your pain.  Take the muscle relaxer Flexeril as needed for muscle spasm; do not drive, operate machinery, or drink alcohol with this medication as it may make you drowsy.    Follow up with an orthopedist if your pain is not improving.  Go to the emergency department if you have worsening pain or develop new symptoms such as difficulty with urination, weakness, numbness, loss of control of your bladder or bowels, fever, chills or other concerns.  You may have a urinary tract infection. We are going to culture your urine and will call you as soon as we have the results.   Drink plenty of water, 8-10 glasses per day.   You may take AZO over the counter for painful urination.  Follow up with your primary care provider as needed.    Urine pregnancy in office negative today.  I have also sent in prednisone for you to take for the next 6 days.

## 2020-01-12 NOTE — ED Provider Notes (Signed)
[facility]    CSN: [id number] Arrival date & time: 01/12/20  1321      History   Chief Complaint Chief Complaint  Patient presents with  . Back Pain    HPI Marissa Contreras is a 29 y.o. female.   Patient reports that she has been experiencing acute right-sided low back pain for the last 3 days.  She reports that she was bending over to pick up a box, and she felt her back pull as she lifted.  Denies having injury like this before.  Patient takes gabapentin, ibuprofen, also has Flexeril at home.  Reports that she has been taking gabapentin and Flexeril with some relief.  Reports that she went to work yesterday, and then it feels too flared back up.  Reports that she thinks there is also some swelling in the area.  Has been using heat.  Has not used any topical rubs.  Denies headache, nausea, vomiting, diarrhea, rash, other symptoms.    The history is provided by the patient.  Back Pain   Past Medical History:  Diagnosis Date  . Anxiety   . Asthma   . Breastfeeding (infant)   . Chronic back pain   . Depression   . H/O varicella   . Headache   . Menorrhagia 08/19/2016  . UTI (urinary tract infection) 09/09/2017    Patient Active Problem List   Diagnosis Date Noted  . Fatigue 09/26/2019  . Cholelithiasis 06/08/2019  . Right hip pain 04/20/2019  . Neck pain 09/28/2018  . PROM (premature rupture of membranes) 05/09/2018  . Vaginal delivery 05/09/2018  . Positive GBS test 04/18/2018  . Migraine 06/03/2017  . Mild intermittent asthma 12/20/2014  . Bilateral hip pain 12/08/2014  . Supervision of other normal pregnancy, antepartum 01/11/2014  . Depression 12/20/2013  . Back pain 02/11/2012    Past Surgical History:  Procedure Laterality Date  . DILATION AND CURETTAGE OF UTERUS    . DILATION AND EVACUATION N/A 03/08/2014   Procedure: DILATATION AND EVACUATION ;  Surgeon: Delice Lesch, MD;  Location: [facility];  Service: Gynecology;  Laterality: N/A;  with  chromosomal studies, sent   . TONSILLECTOMY    . TUBAL LIGATION Bilateral 05/10/2018   Procedure: POST PARTUM TUBAL LIGATION;  Surgeon: Woodroe Mode, MD;  Location: [facility];  Service: Gynecology;  Laterality: Bilateral;    OB History    Gravida  7   Para  3   Term  3   Preterm      AB  4   Living  3     SAB  4   TAB      Ectopic      Multiple  0   Live Births  3            Home Medications    Prior to Admission medications   Medication Sig Start Date End Date Taking? Authorizing Provider  acetaminophen (TYLENOL) 325 MG tablet Take 2 tablets (650 mg total) by mouth every 4 (four) hours as needed (for pain scale < 4). 05/11/18   Darlina Rumpf, CNM  albuterol (PROVENTIL HFA;[name] 108 (90 Base) MCG/ACT inhaler Inhale 2 puffs into the lungs every 6 (six) hours as needed for wheezing or shortness of breath. 08/29/18   Tereasa Coop, PA-C  cyclobenzaprine (FLEXERIL) 10 MG tablet TAKE 1 TABLET BY MOUTH THREE TIMES A DAY AS NEEDED FOR MUSCLE SPASMS 12/15/19   Nuala Alpha, DO  Doxylamine-Pyridoxine (DICLEGIS)  10-10 MG TBEC Take 1 Dose by mouth 2 (two) times daily. 12/02/19   Arlyce Harman, DO  gabapentin (NEURONTIN) 300 MG capsule TAKE 1 CAPSULE BY MOUTH THREE TIMES A DAY 11/09/19   Lockamy, Timothy, DO  ibuprofen (ADVIL,MOTRIN) 600 MG tablet Take 1 tablet (600 mg total) by mouth every 6 (six) hours. 05/11/18   Calvert Cantor, CNM  predniSONE (STERAPRED UNI-PAK 21 TAB) 10 MG (21) TBPK tablet Take by mouth daily for 6 days. Take 6 tablets on day 1, 5 tablets on day 2, 4 tablets on day 3, 3 tablets on day 4, 2 tablets on day 5, 1 tablet on day 6 01/12/20 01/18/20  Moshe Cipro, NP  venlafaxine XR (EFFEXOR-XR) 37.5 MG 24 hr capsule TAKE 3 CAPSULES (112.5 MG TOTAL) BY MOUTH DAILY WITH BREAKFAST. 12/27/19   Arlyce Harman, DO  venlafaxine XR (EFFEXOR XR) 37.5 MG 24 hr capsule Take 3 capsules (112.5 mg total) by mouth daily with breakfast.  12/02/19   Arlyce Harman, DO    Family History Family History  Problem Relation Age of Onset  . Hypertension Father   . Diabetes Father   . Fibromyalgia Mother   . Arthritis Mother   . Depression Mother   . Diabetes Paternal Grandmother   . Cancer Paternal Grandmother   . Stroke Paternal Grandmother     Social History Social History   Tobacco Use  . Smoking status: Former Smoker    Packs/day: 1.00    Types: Cigarettes    Quit date: 09/24/2017    Years since quitting: 2.3  . Smokeless tobacco: Never Used  . Tobacco comment: quit for pregnancy  Substance Use Topics  . Alcohol use: No  . Drug use: No     Allergies   Patient has no known allergies.   Review of Systems Review of Systems  Musculoskeletal: Positive for back pain.     Physical Exam Triage Vital Signs ED Triage Vitals  Enc Vitals Group     BP 01/12/20 1359 117/62     Pulse Rate 01/12/20 1359 97     Resp 01/12/20 1359 18     Temp 01/12/20 1359 98.7 F (37.1 C)     Temp Source 01/12/20 1359 Oral     SpO2 01/12/20 1359 100 %     Weight 01/12/20 1358 171 lb (77.6 kg)     Height --      Head Circumference --      Peak Flow --      Pain Score 01/12/20 1358 7     Pain Loc --      Pain Edu? --      Excl. in GC? --    No data found.  Updated Vital Signs BP 117/62 (BP Location: Right Arm)   Pulse 97   Temp 98.7 F (37.1 C) (Oral)   Resp 18   Wt 171 lb (77.6 kg)   LMP 12/01/2019   SpO2 100%   BMI 26.78 kg/m   Visual Acuity Right Eye Distance:   Left Eye Distance:   Bilateral Distance:    Right Eye Near:   Left Eye Near:    Bilateral Near:     Physical Exam Vitals and nursing note reviewed.  Constitutional:      General: She is not in acute distress.    Appearance: Normal appearance. She is well-developed. She is not ill-appearing.  HENT:     Head: Normocephalic and atraumatic.  Eyes:     Conjunctiva/sclera: Conjunctivae normal.  Cardiovascular:     Rate and Rhythm: Normal  rate and regular rhythm.     Heart sounds: No murmur.  Pulmonary:     Effort: Pulmonary effort is normal. No respiratory distress.     Breath sounds: Normal breath sounds.  Abdominal:     Palpations: Abdomen is soft.     Tenderness: There is no abdominal tenderness.  Musculoskeletal:        General: Swelling and tenderness present.     Cervical back: Normal range of motion and neck supple.     Comments: Swelling and tenderness to palpation to right lower back.  Skin:    General: Skin is warm and dry.     Capillary Refill: Capillary refill takes less than 2 seconds.  Neurological:     General: No focal deficit present.     Mental Status: She is alert and oriented to person, place, and time.  Psychiatric:        Mood and Affect: Mood normal.        Behavior: Behavior normal.        Thought Content: Thought content normal.      UC Treatments / Results  Labs (all labs ordered are listed, but only abnormal results are displayed) Labs Reviewed  URINE CULTURE  POC URINE PREG, ED  POCT PREGNANCY, URINE  POCT URINALYSIS DIP (DEVICE)    EKG   Radiology No results found.  Procedures Procedures (including critical care time)  Medications Ordered in UC Medications  ketorolac (TORADOL) 30 MG/ML injection 30 mg (30 mg Intramuscular Given 01/12/20 1446)    Initial Impression / Assessment and Plan / UC Course  I have reviewed the triage vital signs and the nursing notes.  Pertinent labs & imaging results that were available during my care of the patient were reviewed by me and considered in my medical decision making (see chart for details).     Right-sided low back pain: Presents with right-sided low back pain for the last 3 days.  Has used gabapentin, Flexeril, heat with temporary relief.  She may continue to use these medications for relief.  Instructed patient that she may use topical rubs such as icy hot, she may continue with the heat or cold as needed.  She may also take  Tylenol and ibuprofen as needed for pain.  Prescribed prednisone taper to help with inflammation.  Also received 30 mg Toradol IM in office today for pain relief. Lungs CTA bilaterally.  UA negative in office for infection.  Urine pregnancy test also negative.  Instructed patient to follow-up with this office or with primary care if symptoms are not improving over the next 3 to 4 days.  Instructed patient to follow-up with the ER for sensation, loss of strength, loss of bladder control, other concerning symptoms.  Patient verbalizes understanding is in agreement with treatment plan. Final Clinical Impressions(s) / UC Diagnoses   Final diagnoses:  Acute right-sided low back pain without sciatica  Muscle strain     Discharge Instructions     Take the prescribed ibuprofen as needed for your pain.  Take the muscle relaxer Flexeril as needed for muscle spasm; do not drive, operate machinery, or drink alcohol with this medication as it may make you drowsy.    Follow up with an orthopedist if your pain is not improving.  Go to the emergency department if you have worsening pain or develop new symptoms such as difficulty with urination, weakness, numbness, loss of control of your bladder  or bowels, fever, chills or other concerns.  You may have a urinary tract infection. We are going to culture your urine and will call you as soon as we have the results.   Drink plenty of water, 8-10 glasses per day.   You may take AZO over the counter for painful urination.  Follow up with your primary care provider as needed.    Urine pregnancy in office negative today.  I have also sent in prednisone for you to take for the next 6 days.      ED Prescriptions    Medication Sig Dispense Auth. Provider   predniSONE (STERAPRED UNI-PAK 21 TAB) 10 MG (21) TBPK tablet Take by mouth daily for 6 days. Take 6 tablets on day 1, 5 tablets on day 2, 4 tablets on day 3, 3 tablets on day 4, 2 tablets on day 5, 1 tablet  on day 6 21 tablet Moshe Cipro, NP     PDMP not reviewed this encounter.   Moshe Cipro, NP 01/12/20 1520

## 2020-01-12 NOTE — ED Triage Notes (Signed)
Pt states she has lower back pain. Pt states she was lifting something and she hurt her lower back.

## 2020-01-13 ENCOUNTER — Other Ambulatory Visit: Payer: Self-pay | Admitting: Family Medicine

## 2020-01-13 DIAGNOSIS — G8929 Other chronic pain: Secondary | ICD-10-CM

## 2020-01-13 LAB — URINE CULTURE

## 2020-01-13 MED ORDER — AZITHROMYCIN 250 MG PO TABS: ORAL_TABLET | ORAL | 0 refills | Status: DC

## 2020-01-13 MED ORDER — AMOXICILLIN 500 MG PO CAPS: 1000.0000 mg | ORAL_CAPSULE | Freq: Three times a day (TID) | ORAL | 0 refills | Status: AC

## 2020-01-13 NOTE — Progress Notes (Signed)
Discussed increasing Effexor 37.5mg  from TID to QID. If she has no improvement I will refer to Psych.  Patient with thick, copious secretions and mucous with cough for 7 days and not improving. Will send in antibiotic Amoxicillin and Azithromycin to cover for CAP.  Referral to pain management for ongoing nerve pain

## 2020-01-16 ENCOUNTER — Encounter: Payer: Self-pay | Admitting: Family Medicine

## 2020-01-18 ENCOUNTER — Telehealth: Payer: Self-pay

## 2020-01-18 ENCOUNTER — Other Ambulatory Visit: Payer: Self-pay | Admitting: Family Medicine

## 2020-01-18 DIAGNOSIS — F3341 Major depressive disorder, recurrent, in partial remission: Secondary | ICD-10-CM

## 2020-01-18 MED ORDER — VENLAFAXINE HCL ER 150 MG PO CP24: 150.0000 mg | ORAL_CAPSULE | Freq: Every day | ORAL | 2 refills | Status: DC

## 2020-01-18 MED ORDER — GABAPENTIN 300 MG PO CAPS: 300.0000 mg | ORAL_CAPSULE | Freq: Four times a day (QID) | ORAL | 2 refills | Status: DC

## 2020-01-18 NOTE — Progress Notes (Signed)
Patient requesting more Gabapentin and feels like anxiety is not well controlled. I did discuss with patient that if her symptoms persist I will not be increasing medications as her anxiety and depression have been refractory to several medications. I will refer to Psych if anxiety and depression do not improve. If pain does not improve with Gabapentin I do not recommend any more increases in dose and will refer to PMR.

## 2020-01-18 NOTE — Telephone Encounter (Signed)
Patient calls nurse line stating Gabapentin is not ready for pick up, despite being called in this am. Looks like the prescription was sent in with two separate sigs, 4x a day and 3x a day. Clarified with provider and Gabapentin 4x daily is correct. I called the pharmacy and informed them of this and made a verbal change.

## 2020-01-19 ENCOUNTER — Encounter: Payer: Self-pay | Admitting: Family Medicine

## 2020-01-24 ENCOUNTER — Ambulatory Visit (INDEPENDENT_AMBULATORY_CARE_PROVIDER_SITE_OTHER): Payer: Medicaid Other | Admitting: Family Medicine

## 2020-01-24 ENCOUNTER — Other Ambulatory Visit: Payer: Self-pay

## 2020-01-24 VITALS — BP 110/80 | HR 98 | Ht 66.0 in | Wt 173.4 lb

## 2020-01-24 DIAGNOSIS — G8929 Other chronic pain: Secondary | ICD-10-CM

## 2020-01-24 DIAGNOSIS — M5441 Lumbago with sciatica, right side: Secondary | ICD-10-CM

## 2020-01-24 MED ORDER — TIZANIDINE HCL 4 MG PO TABS: 4.0000 mg | ORAL_TABLET | Freq: Four times a day (QID) | ORAL | 2 refills | Status: DC | PRN

## 2020-01-24 MED ORDER — PREDNISONE 10 MG PO TABS: 20.0000 mg | ORAL_TABLET | Freq: Every day | ORAL | 0 refills | Status: DC

## 2020-01-24 NOTE — Progress Notes (Signed)
    SUBJECTIVE:   CHIEF COMPLAINT / HPI:   Low Back Pain Patient states she was at work on 4/20 and bent over to pick something up and feels like she "pulled a muscle" in her lower back. It is tender and hurts to bend over or move on her right side. It is non-radiating and "feels sore". The pain is constant and is rated at a 8/10. She was seen in an urgent care given a Toradol shot and a steroid pack which helped a little. She is requesting a work note and wants to know if she can have an MRI. I did explain that imaging at this time would not be warranted but we have options for helping her with pain.  PERTINENT  PMH / PSH: Depression, Anxiety, Sciatica  OBJECTIVE:   BP 110/80   Pulse 98   Ht 5\' 6"  (1.676 m)   Wt 173 lb 6.4 oz (78.7 kg)   SpO2 98%   BMI 27.99 kg/m   Lumbar spine: - Inspection: no gross deformity or asymmetry, swelling or ecchymosis. No skin changes - Palpation: No TTP over the spinous processes, TTP over the right paraspinal muscles, No tenderness at the SI joints b/l - ROM: limited active ROM of the lumbar spine in flexion and extension due to pain - Strength: 5/5 strength of lower extremity in L4-S1 nerve root distributions b/l - Neuro: sensation intact in the L4-S1 nerve root distribution b/l, 2+ L4 and S1 reflexes - Special testing: Positive straight leg raise on the right   ASSESSMENT/PLAN:   Low back pain Acute on chronic. Most likely MSK in nature given it was related to known mechanism of injury and she has no red flag symptoms. - cont with conservative therapy with heating pads, ice packs, rest - work note given limiting her at work to no lifting for 2 weeks with a progressive return to full duty over a period of 6 weeks. Can lift 5lbs objects after two weeks, 10lbs, after the next two weeks, etc - Tizanidine for pain relief and steroid taper dose pack for 6 days - If she continues to have pain after 6 weeks consider x-rays     ,  DO [facility]

## 2020-01-24 NOTE — Patient Instructions (Signed)
It was great to see you today! Thank you for letting me participate in your care!  Today, we discussed your low back pain. I have sent several prescriptions to the pharmacy. Please pick them up and take them as prescribed.  Be well, Jules Schick, DO PGY-3, [name]

## 2020-01-26 NOTE — Assessment & Plan Note (Signed)
Acute on chronic. Most likely MSK in nature given it was related to known mechanism of injury and she has no red flag symptoms. - cont with conservative therapy with heating pads, ice packs, rest - work note given limiting her at work to no lifting for 2 weeks with a progressive return to full duty over a period of 6 weeks. Can lift 5lbs objects after two weeks, 10lbs, after the next two weeks, etc - Tizanidine for pain relief and steroid taper dose pack for 6 days - If she continues to have pain after 6 weeks consider x-rays

## 2020-01-27 ENCOUNTER — Other Ambulatory Visit: Payer: Self-pay

## 2020-01-27 ENCOUNTER — Encounter
Payer: Medicaid Other | Attending: Physical Medicine and Rehabilitation | Admitting: Physical Medicine and Rehabilitation

## 2020-01-27 ENCOUNTER — Encounter: Payer: Self-pay | Admitting: Physical Medicine and Rehabilitation

## 2020-01-27 VITALS — BP 124/84 | HR 80 | Temp 98.7°F | Ht 66.0 in | Wt 175.4 lb

## 2020-01-27 DIAGNOSIS — Z5181 Encounter for therapeutic drug level monitoring: Secondary | ICD-10-CM | POA: Diagnosis present

## 2020-01-27 DIAGNOSIS — M7918 Myalgia, other site: Secondary | ICD-10-CM | POA: Diagnosis not present

## 2020-01-27 DIAGNOSIS — M5442 Lumbago with sciatica, left side: Secondary | ICD-10-CM | POA: Insufficient documentation

## 2020-01-27 DIAGNOSIS — G8929 Other chronic pain: Secondary | ICD-10-CM

## 2020-01-27 DIAGNOSIS — G894 Chronic pain syndrome: Secondary | ICD-10-CM | POA: Diagnosis present

## 2020-01-27 DIAGNOSIS — M5441 Lumbago with sciatica, right side: Secondary | ICD-10-CM | POA: Insufficient documentation

## 2020-01-27 DIAGNOSIS — M797 Fibromyalgia: Secondary | ICD-10-CM | POA: Diagnosis not present

## 2020-01-27 DIAGNOSIS — Z79891 Long term (current) use of opiate analgesic: Secondary | ICD-10-CM | POA: Insufficient documentation

## 2020-01-27 MED ORDER — GABAPENTIN 600 MG PO TABS: 600.0000 mg | ORAL_TABLET | Freq: Three times a day (TID) | ORAL | 5 refills | Status: DC

## 2020-01-27 NOTE — Patient Instructions (Signed)
Patient is a 29 yr old R handed  female with hx of anxiety and depression with With bad sunburn from Wednesday who here's for evaluation of B/L low back pain with sciatica down B/L legs- down to toes. Likely herniated disc- causing a noxious stimulation ot spinal cord- reason likely steroids  didn't workAlso has myofascial pain syndrome and fibromyalgia based on exam.    1. Gabapentin- 600 mg 3x/day x 1 week- 600 mg 3x/day and 1200 mg nightly- if needed, can go up. If has intolerable side effects, call me and will try Lyrica.   2. Myofascial pain syndrome- will get back for trigger point injections.   1. 3. .Dr Nance Pew- The survivor's Handbook to Fibromyalgia and Myofascial Pain Syndrome 2. Theracane- 2-4 minutes on each trigger point- hold firm pressure, don't massage; can get for $20-30 online- will never need replacing- youtube videos to use it.  3. Tennis balls- 2-5 minutes on each trigger point- buttocks, back of thighs, calves, low and mid back- can throw in dryer x1 to make softer. 4. Magnesium 400 mg- 1-3x/day for muscle tightness- titrate up until loose stools  5. 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 Back to work- head for work Monday.   8. F/U 2 weeks.

## 2020-01-27 NOTE — Progress Notes (Signed)
Subjective:    Patient ID: Jasmyne Lodato, female    DOB: August 03, 1991, 29 y.o.   MRN: [id number]  HPI   Patient is a 29 yr old R handed  female with hx of anxiety and depression with With bad sunburn from Wednesday who here's for evaluation of B/L low back pain with sciatica down B/L legs- down to toes.   Bent over and pulled a muscle - last month-  Went to urgent care 4/20 for pulled muscle on 4/19 and 5/3 re-pulled muscle- just bent over-   Changed Muscle relaxant to Zanaflex from Flexeril- taking 4x/day-also put on gabapentin- now on 300 mg 4x/day  Also pulled off ibuprofen  A couple of years ago. Was taking fo r"regular pain".    Pain down legs- is sharp pain down legs and B/L hips- R side is worse than the L side. -  Legs and arms go numb easier- tingle a lot   Medication helps while moving, but when stops moving, can feel everything.  So meds are doing something, but just not enough.   On steroid taper currently- by PCP- started on 20 mg  On 4/20- also was on steroid taper- starting at 60 mg daily for 6 days but was not effective.    No new bowel/bladder issues.  No weakness Has numbness and tingling from "regular pain", but not new numbness/tingling- in legs and arms.   Doesn't have dx in previous to this issue for "previous pain".  Has  Mild kyphosis in neck from before, but no Dx for previous pain.  Pain all over- when 29 yrs old-was told she might have Fibromyalgia when started with back and legs pain and went to hospital.    In last 2 months, fingers and toes have been getting really stiff.   "Klutz" and fell 3 weeks ago. Usually falls 4-5x/year.  Also told had Osgood-Schlatter's disease in childhood and that "kneecaps turned out".   Also , touch "lingers' when touched in a painful way.    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  No real heavy lifting.        Pain Inventory Average Pain 8 Pain Right Now 8 My pain is sharp, burning, dull, stabbing, tingling and aching  In the last 24 hours, has pain interfered with the following? General activity 8 Relation with others 8 Enjoyment of life 9 What TIME of day is your pain at its worst? morning evening Sleep (in general) Poor  Pain is worse with: bending, sitting, standing and some activites Pain improves with: medication Relief from Meds: 3  Mobility how many minutes can you walk? 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 Hypertension Father   . Diabetes Father   . Fibromyalgia Mother   . Arthritis Mother   . Depression Mother   . Diabetes Paternal Grandmother   . Cancer Paternal Grandmother   . Stroke Paternal Grandmother    Social History   Socioeconomic History  . Marital  status: Married    Spouse name: tommy robinson  . Number of children: 2  . Years of education: 19  . Highest education level: 12th grade  Occupational History  . Not on file  Tobacco Use  . Smoking status: Former Smoker    Packs/day: 1.00    Types: Cigarettes    Quit date: 09/24/2017    Years since quitting: 2.3  . Smokeless tobacco: Never Used  . Tobacco comment: quit for pregnancy  Substance and Sexual Activity  . Alcohol use: No  . Drug use: No  . Sexual activity: Yes    Birth control/protection: None  Other Topics Concern  . Not on file  Social History Narrative   Lives with husband and mother in law.  Husband is 61 years older, works at [employer].  No hobbies, described herself as a "homebody"  Online courses for high school due to difficulty with social stresses.     Social Determinants of Health   Financial Resource Strain:   . Difficulty of Paying Living Expenses:   Food Insecurity:   . Worried About Charity fundraiser in the Last Year:   . Arboriculturist in the Last Year:   Transportation Needs:   . Film/video editor (Medical):   [name]   Physical Activity:   . Days of Exercise per Week:   . Minutes of Exercise per Session:   Stress:   . Feeling of Stress :   Social Connections:   . Frequency of Communication with Friends and Family:   . Frequency of Social Gatherings with Friends and Family:   . Attends Religious Services:   . Active Member of Clubs or Organizations:   . Attends Archivist Meetings:   Marland Kitchen Marital Status:    Past Surgical History:  Procedure Laterality Date  . DILATION AND CURETTAGE OF UTERUS    . DILATION AND EVACUATION N/A 03/08/2014   Procedure: DILATATION AND EVACUATION ;  Surgeon: Delice Lesch, MD;  Location: [facility];  Service: Gynecology;  Laterality: N/A;  with chromosomal studies, sent   . TONSILLECTOMY    . TUBAL LIGATION Bilateral 05/10/2018   Procedure: POST PARTUM TUBAL LIGATION;  Surgeon: Woodroe Mode, MD;  Location: [facility];  Service: Gynecology;  Laterality: Bilateral;   Past Medical History:  Diagnosis Date  . Anxiety   . Asthma   . Breastfeeding (infant)   . Chronic back pain   . Depression   . H/O varicella   . Headache   . Menorrhagia 08/19/2016  . UTI (urinary tract infection) 09/09/2017   BP 124/84   Pulse 80   Temp 98.7 F (37.1 C)   Ht 5\' 6"  (1.676 m)   Wt 175 lb 6.4 oz (79.6 kg)   SpO2 99%   BMI 28.31 kg/m   Opioid Risk Score:   Fall Risk Score:  `1  Depression screen PHQ 2/9  Depression screen [facility] 2/9 01/27/2020 09/26/2019 04/20/2019 04/26/2018 04/12/2018 03/29/2018 03/15/2018  Decreased  Interest 1 2 0 3 3 1 2   Down, Depressed, Hopeless 0 0 0 3 3 3 3   PHQ - 2 Score 1 2 0 6 6 4 5   Altered sleeping 3 - - 3 3 3 3   Tired, decreased energy 1 - - 3 3 3 2   Change in appetite 1 - - 3 3 3 3   Feeling bad or failure about yourself  0 - - 3 3 2  2  Trouble concentrating 0 - - 3 3 3 2   Moving slowly or fidgety/restless 0 - - 3 3 3 2   Suicidal thoughts 0 - - 0 0 0 0  PHQ-9 Score 6 - - 24 24 21 19   Difficult doing work/chores Somewhat difficult - - - - - -  Some recent data might be hidden    Review of Systems  Constitutional: Positive for diaphoresis and unexpected weight change.  Neurological: Positive for dizziness, weakness and numbness.       Spasms tingling  All other systems reviewed and are negative. bruises easily and when husband touches her, barely, is painful     Objective:   Physical Exam MS: 5/5 in UEs and LEs -tested Deltoid, biceps, triceps, WE, grip and finger abd and HF, KE, KF DF and PF B/L  TTP over multiple tender points AND trigger points on exam Also B/L trigger points lumbar paraspinals Increased pain with rotation/extension but most pain with lumbar flexion in low back Patella- are facing outwards, not straight ahead- c/w patellofemoral syndrome B/L Patella moves a little much than expected-     Neuro: Intact to light touch in all 4 extremities B/L         Assessment & Plan:  Patient is a 29 yr old R handed  female with hx of anxiety and depression with With bad sunburn from Wednesday who here's for evaluation of B/L low back pain with sciatica down B/L legs- down to toes. Likely herniated disc- causing a noxious stimulation ot spinal cord- reason likely steroids  didn't workAlso has myofascial pain syndrome and fibromyalgia based on exam.    1. Gabapentin- 600 mg 3x/day x 1 week- 600 mg 3x/day and 1200 mg nightly- if needed, can go up. If has intolerable side effects, call me and will try Lyrica.   2. Myofascial pain syndrome- will get  back for trigger point injections.   1. 3. .Dr - The survivor's Handbook to Fibromyalgia and Myofascial Pain Syndrome 2. Theracane- 2-4 minutes on each trigger point- hold firm pressure, don't massage; can get for $20-30 online- will never need replacing- youtube videos to use it.  3. Tennis balls- 2-5 minutes on each trigger point- buttocks, back of thighs, calves, low and mid back- can throw in dryer x1 to make softer. 4. Magnesium 400 mg- 1-3x/day for muscle tightness- titrate up until loose stools  5. 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 Back to work- head for work Monday.   8. F/U 2 weeks.   I spent a total of 1 hour on appointment-

## 2020-02-09 ENCOUNTER — Other Ambulatory Visit: Payer: Self-pay | Admitting: Family Medicine

## 2020-02-10 ENCOUNTER — Encounter: Payer: Medicaid Other | Admitting: Physical Medicine and Rehabilitation

## 2020-02-10 ENCOUNTER — Other Ambulatory Visit: Payer: Self-pay

## 2020-02-10 ENCOUNTER — Encounter: Payer: Self-pay | Admitting: Physical Medicine and Rehabilitation

## 2020-02-10 ENCOUNTER — Ambulatory Visit: Payer: Medicaid Other | Admitting: Physical Medicine and Rehabilitation

## 2020-02-10 VITALS — BP 112/80 | HR 86 | Temp 97.5°F | Ht 66.0 in | Wt 171.0 lb

## 2020-02-10 DIAGNOSIS — M7918 Myalgia, other site: Secondary | ICD-10-CM

## 2020-02-10 DIAGNOSIS — G894 Chronic pain syndrome: Secondary | ICD-10-CM

## 2020-02-10 DIAGNOSIS — Z5181 Encounter for therapeutic drug level monitoring: Secondary | ICD-10-CM

## 2020-02-10 DIAGNOSIS — M797 Fibromyalgia: Secondary | ICD-10-CM | POA: Diagnosis not present

## 2020-02-10 DIAGNOSIS — Z79891 Long term (current) use of opiate analgesic: Secondary | ICD-10-CM

## 2020-02-10 MED ORDER — TRAMADOL HCL 50 MG PO TABS: 50.0000 mg | ORAL_TABLET | Freq: Three times a day (TID) | ORAL | 0 refills | Status: DC | PRN

## 2020-02-10 NOTE — Patient Instructions (Addendum)
Plan: 1.  Patient here for trigger point injections for myofascial pain  Consent done and on chart.  Cleaned areas with alcohol and injected using a 27 gauge 1.5 inch needle  Injected  4.5cc Using 1% Lidocaine with no EPI  Upper traps B/L Levators B/L Posterior scalenes B/L Middle scalenes Splenius Capitus Pectoralis Major Rhomboids B/L Infraspinatus Teres Major/minor Thoracic paraspinals Lumbar paraspinals B/L Other injections-    Patient's level of pain prior was 8/10 Current level of pain after injections is 5/10 Improved ROM except R shoulder (pain since middle school)  There was no bleeding or complications.  Patient was advised to drink a lot of water on day after injections to flush system Will have increased soreness for 12-48 hours after injections.  Can use Lidocaine patches the day AFTER injections Can use theracane on day of injections in places didn't inject Can use heating pad 4-6 hours AFTER injections  2. Do ROM exercises before and during work  3. Tramadol 50 mg up to 3x/day as needed for pain- 7 day supply since first Rx- if you take with tylenol - can make it last longer.   4. Opiate contract and urine drug screen   5. F/U in 4 weeks

## 2020-02-10 NOTE — Progress Notes (Signed)
    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      Plan: 1.  Patient here for trigger point injections for myofascial pain  Consent done and on chart.  Cleaned areas with alcohol and injected using a 27 gauge 1.5 inch needle  Injected  4.5cc Using 1% Lidocaine with no EPI  Upper traps B/L Levators B/L Posterior scalenes B/L Middle scalenes Splenius Capitus Pectoralis Major Rhomboids B/L Infraspinatus Teres Major/minor Thoracic paraspinals Lumbar paraspinals B/L Other injections-    Patient's level of pain prior was 8/10 Current level of pain after injections is 5/10 Improved ROM except R shoulder (pain since middle school)  There was no bleeding or complications.  Patient was advised to drink a lot of water on day after injections to flush system Will have increased soreness for 12-48 hours after injections.  Can use Lidocaine patches the day AFTER injections Can use theracane on day of injections in places didn't inject Can use heating pad 4-6 hours AFTER injections  2. Do ROM exercises before and during work  3. Tramadol 50 mg up to 3x/day as needed for pain- 7 day supply since first Rx- if you take with tylenol - can make it last longer.   4. Opiate contract and urine drug screen   5. F/U in 4 weeks  I spent a total of 30 minutes on appointment- as detailed above.

## 2020-02-14 LAB — TOXASSURE SELECT,+ANTIDEPR,UR

## 2020-02-18 ENCOUNTER — Other Ambulatory Visit: Payer: Self-pay | Admitting: Family Medicine

## 2020-02-22 ENCOUNTER — Telehealth: Payer: Self-pay | Admitting: *Deleted

## 2020-02-22 NOTE — Telephone Encounter (Signed)
Urine drug screen is consistent for having no controlled medications present.

## 2020-02-27 ENCOUNTER — Telehealth: Payer: Self-pay | Admitting: *Deleted

## 2020-02-27 MED ORDER — TRAMADOL HCL 50 MG PO TABS: 50.0000 mg | ORAL_TABLET | Freq: Three times a day (TID) | ORAL | 5 refills | Status: DC | PRN

## 2020-02-27 NOTE — Telephone Encounter (Signed)
Marissa Contreras called for a refill on her Tramadol.  Last fill was 02/18/20 #21 tablets per PMP.

## 2020-02-27 NOTE — Telephone Encounter (Signed)
Done tramadol TID prn #90 5 RFs

## 2020-03-05 ENCOUNTER — Encounter: Payer: Self-pay | Admitting: Physical Medicine and Rehabilitation

## 2020-03-05 ENCOUNTER — Other Ambulatory Visit: Payer: Self-pay

## 2020-03-05 ENCOUNTER — Encounter
Payer: Medicaid Other | Attending: Physical Medicine and Rehabilitation | Admitting: Physical Medicine and Rehabilitation

## 2020-03-05 VITALS — BP 119/84 | HR 93 | Temp 97.5°F | Wt 175.0 lb

## 2020-03-05 DIAGNOSIS — M5441 Lumbago with sciatica, right side: Secondary | ICD-10-CM | POA: Insufficient documentation

## 2020-03-05 DIAGNOSIS — M5442 Lumbago with sciatica, left side: Secondary | ICD-10-CM | POA: Diagnosis not present

## 2020-03-05 DIAGNOSIS — G8929 Other chronic pain: Secondary | ICD-10-CM

## 2020-03-05 DIAGNOSIS — Z5181 Encounter for therapeutic drug level monitoring: Secondary | ICD-10-CM | POA: Insufficient documentation

## 2020-03-05 DIAGNOSIS — G894 Chronic pain syndrome: Secondary | ICD-10-CM | POA: Diagnosis not present

## 2020-03-05 DIAGNOSIS — M7918 Myalgia, other site: Secondary | ICD-10-CM | POA: Diagnosis not present

## 2020-03-05 DIAGNOSIS — Z79891 Long term (current) use of opiate analgesic: Secondary | ICD-10-CM | POA: Insufficient documentation

## 2020-03-05 DIAGNOSIS — M797 Fibromyalgia: Secondary | ICD-10-CM | POA: Diagnosis present

## 2020-03-05 MED ORDER — DULOXETINE HCL 30 MG PO CPEP: 30.0000 mg | ORAL_CAPSULE | Freq: Every day | ORAL | 3 refills | Status: DC

## 2020-03-05 MED ORDER — TIZANIDINE HCL 4 MG PO TABS: 4.0000 mg | ORAL_TABLET | Freq: Four times a day (QID) | ORAL | 5 refills | Status: DC | PRN

## 2020-03-05 MED ORDER — TRAMADOL HCL 50 MG PO TABS: 50.0000 mg | ORAL_TABLET | Freq: Three times a day (TID) | ORAL | 5 refills | Status: DC | PRN

## 2020-03-05 NOTE — Progress Notes (Signed)
Subjective:    Patient ID: Marissa Contreras, female    DOB: 24-Jul-1991, 29 y.o.   MRN: [id number]  HPI  Patient is a 29 yr old R handed  female with hx of anxiety and depression with With bad sunburn from Wednesday who here's for evaluation of B/L low back pain with sciatica down B/L legs- down to toes. Likely herniated disc- causing a noxious stimulation ot spinal cord- reason likely steroids  didn't workAlso has myofascial pain syndrome and fibromyalgia based on exam.    Has feet burning- on sides; lateral aspect and bottoms and toes also hurt.   No improvement with different shoes.  Both feet- equal.   No side effects Extremely exhausted/can barely stay awake.  Just last week.  Feels like Gabapentin helped some- 35% improvement-  No side effects.   Off Prednisone- pulled back muscle.    Tramadol- helped with back and ribs- not with anything else.   Muscles twitching more this week- as well.  Everywhere. - even had mouth do it-  Drinks caffeine- 4 sodas- 12 oz/day.    Trigger point injections- lasted "a few days" but was the first time did it, so wants to try again.   Having pressure/tightness up high in splenius capitus as well  Using theracane at least 1x/day.   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    Pain Inventory Average Pain 8 Pain Right Now 8 My pain is sharp, burning, dull, stabbing, tingling and aching  In the last 24 hours, has pain interfered with the following? General activity 9 Relation with others 0 Enjoyment of life 3 What TIME of day is your pain at its worst? all Sleep (in general) Poor  Pain is worse with: walking, bending, inactivity and standing Pain improves with: medication Relief from Meds: 3  Mobility walk without assistance  Function employed # of hrs/week 40-48  Neuro/Psych weakness numbness tingling spasms dizziness depression anxiety  Prior Studies Any changes since last  visit?  no  Physicians involved in your care Any changes since last visit?  no   Family History  Problem Relation Age of Onset  . Hypertension Father   . Diabetes Father   . Fibromyalgia Mother   . Arthritis Mother   . Depression Mother   . Diabetes Paternal Grandmother   . Cancer Paternal Grandmother   . Stroke Paternal Grandmother    Social History   Socioeconomic History  . Marital status: Married    Spouse name: tommy robinson  . Number of children: 2  . Years of education: 30  . Highest education level: 12th grade  Occupational History  . Not on file  Tobacco Use  . Smoking status: Former Smoker    Packs/day: 1.00    Types: Cigarettes    Quit date: 09/24/2017    Years since quitting: 2.4  . Smokeless tobacco: Never Used  . Tobacco comment: quit for pregnancy  Vaping Use  . Vaping Use: Former  Substance and Sexual Activity  . Alcohol use: No  . Drug use: No  . Sexual activity: Yes    Birth control/protection: None  Other Topics Concern  . Not on file  Social History Narrative   Lives with husband and mother in law.  Husband is 67 years older, works at [employer].  No hobbies, described herself as a "homebody"  Online courses for high school due to difficulty with social stresses.  Social Determinants of Health   Financial Resource Strain:   . Difficulty of Paying Living Expenses:   Food Insecurity:   . Worried About Programme researcher, broadcasting/film/video in the Last Year:   . Barista in the Last Year:   Transportation Needs:   . Freight forwarder (Medical):   [name]   Physical Activity:   . Days of Exercise per Week:   . Minutes of Exercise per Session:   Stress:   . Feeling of Stress :   Social Connections:   . Frequency of Communication with Friends and Family:   . Frequency of Social Gatherings with Friends and Family:   . Attends Religious Services:   . Active Member of Clubs or Organizations:   . Attends Occupational hygienist Meetings:   Marland Kitchen Marital Status:    Past Surgical History:  Procedure Laterality Date  . DILATION AND CURETTAGE OF UTERUS    . DILATION AND EVACUATION N/A 03/08/2014   Procedure: DILATATION AND EVACUATION ;  Surgeon: Purcell Nails, MD;  Location: [facility];  Service: Gynecology;  Laterality: N/A;  with chromosomal studies, sent   . TONSILLECTOMY    . TUBAL LIGATION Bilateral 05/10/2018   Procedure: POST PARTUM TUBAL LIGATION;  Surgeon: Adam Phenix, MD;  Location: [facility];  Service: Gynecology;  Laterality: Bilateral;   Past Medical History:  Diagnosis Date  . Anxiety   . Asthma   . Breastfeeding (infant)   . Chronic back pain   . Depression   . H/O varicella   . Headache   . Menorrhagia 08/19/2016  . UTI (urinary tract infection) 09/09/2017   BP 119/84   Pulse 93   Temp (!) 97.5 F (36.4 C)   Wt 175 lb (79.4 kg)   SpO2 98%   BMI 28.25 kg/m   Opioid Risk Score:   Fall Risk Score:  `1  Depression screen PHQ 2/9  Depression screen [facility] 2/9 01/27/2020 09/26/2019 04/20/2019 04/26/2018 04/12/2018 03/29/2018 03/15/2018  Decreased Interest 1 2 0 3 3 1 2   Down, Depressed, Hopeless 0 0 0 3 3 3 3   PHQ - 2 Score 1 2 0 6 6 4 5   Altered sleeping 3 - - 3 3 3 3   Tired, decreased energy 1 - - 3 3 3 2   Change in appetite 1 - - 3 3 3 3   Feeling bad or failure about yourself  0 - - 3 3 2 2   Trouble concentrating 0 - - 3 3 3 2   Moving slowly or fidgety/restless 0 - - 3 3 3 2   Suicidal thoughts 0 - - 0 0 0 0  PHQ-9 Score 6 - - 24 24 21 19   Difficult doing work/chores Somewhat difficult - - - - - -  Some recent data might be hidden    Review of Systems  Constitutional: Positive for appetite change and diaphoresis.  HENT: Negative.   Eyes: Negative.   Respiratory: Positive for cough and shortness of breath.   Gastrointestinal: Positive for abdominal pain, constipation and diarrhea.  Endocrine: Negative.   Genitourinary: Negative.   Musculoskeletal: Positive for  arthralgias, back pain, myalgias, neck pain and neck stiffness.  Skin: Negative.   Neurological: Positive for weakness, numbness and headaches.       Tingling  Psychiatric/Behavioral: The patient is nervous/anxious.        Objective:   Physical Exam  Awake, alert, appropriate, accompanied by family member, NAD Tight  musculature- scalenes, levators, splenius capitus, and rhomboids, and upper traps B/L      Assessment & Plan:    Patient is a 29 yr old R handed  female with hx of anxiety and depression who here's for f/uof B/L low back pain with sciatica down B/L legs- down to toes. Likely herniated disc- causing a noxious stimulation ot spinal cord- reason likely steroids  didn't workAlso has myofascial pain syndrome and fibromyalgia based on exam.    1. Muscle relaxants work better when takes a break from them- so if possible use AS needed.   2. Tramadol 50 mg 3x/day as needed- for pain.  Ordered #90 with 5 RFs  3. Patient here for trigger point injections for myofascial  Pain/fibromyaliga  Consent done and on chart.  Cleaned areas with alcohol and injected using a 27 gauge 1.5 inch needle  Injected  4.5 cc Using 1% Lidocaine with no EPI  Upper traps B/L Levators B/L Posterior scalenes B/L Middle scalenes Splenius Capitus B/L- high up Pectoralis Major B/L Rhomboids B/L and R middle rhomboids Infraspinatus Teres Major/minor Thoracic paraspinals Lumbar paraspinals Other injections-    Patient's level of pain prior was- 8/10 Current level of pain after injections is- 5/10- -more ROM of neck  There was no bleeding or complications.  Patient was advised to drink a lot of water on day after injections to flush system Will have increased soreness for 12-48 hours after injections.  Can use Lidocaine patches the day AFTER injections Can use theracane on day of injections in places didn't inject Can use heating pad 4-6 hours AFTER injections  4.  Duloxetine /Cymbalta  30 mg nightly x 1 week  Then 60 mg nightly- for nerve pain  1% of patients can have nausea with Duloxetine- call me if needs an anti-nausea medicine. Can also cause mild dry mouth/dry eyes and mild constipation.  Can take morning or evening.   5. Can take you SSRI and Duloxetine- just not a higher dose of either.   6. F/U 4 weeks.    I spent a total of 40 minutes on appointment

## 2020-03-05 NOTE — Patient Instructions (Signed)
Patient is a 29 yr old R handed  female with hx of anxiety and depression who here's for f/uof B/L low back pain with sciatica down B/L legs- down to toes. Likely herniated disc- causing a noxious stimulation ot spinal cord- reason likely steroids  didn't workAlso has myofascial pain syndrome and fibromyalgia based on exam.    1. Muscle relaxants work better when takes a break from them- so if possible use AS needed.   2. Tramadol 50 mg 3x/day as needed- for pain.  Ordered #90 with 5 RFs  3. Patient here for trigger point injections for myofascial  Pain/fibromyaliga  Consent done and on chart.  Cleaned areas with alcohol and injected using a 27 gauge 1.5 inch needle  Injected  4.5 cc Using 1% Lidocaine with no EPI  Upper traps B/L Levators B/L Posterior scalenes B/L Middle scalenes Splenius Capitus B/L- high up Pectoralis Major B/L Rhomboids B/L and R middle rhomboids Infraspinatus Teres Major/minor Thoracic paraspinals Lumbar paraspinals Other injections-    Patient's level of pain prior was- 8/10 Current level of pain after injections is- 5/10- -more ROM of neck  There was no bleeding or complications.  Patient was advised to drink a lot of water on day after injections to flush system Will have increased soreness for 12-48 hours after injections.  Can use Lidocaine patches the day AFTER injections Can use theracane on day of injections in places didn't inject Can use heating pad 4-6 hours AFTER injections  4.  Duloxetine /Cymbalta 30 mg nightly x 1 week  Then 60 mg nightly- for nerve pain  1% of patients can have nausea with Duloxetine- call me if needs an anti-nausea medicine. Can also cause mild dry mouth/dry eyes and mild constipation.  Can take morning or evening.   5. Can take you SSRI and Duloxetine- just not a higher dose of either.   6. F/U 4 weeks.

## 2020-03-06 ENCOUNTER — Telehealth: Payer: Self-pay | Admitting: *Deleted

## 2020-03-06 ENCOUNTER — Telehealth: Payer: Self-pay

## 2020-03-06 NOTE — Telephone Encounter (Signed)
Prior authorization submitted via [place] [employer] for tramadol.  Awaiting response

## 2020-03-06 NOTE — Telephone Encounter (Signed)
Patient called stating that Tr[facility]adol [facility] was [facility] but she states she is locked into [facility] could it be sent into there. [facility] called [facility] and cancelled prescription along with refills. Called prescription in Tramadol 50 mg to [facility].

## 2020-03-07 NOTE — Telephone Encounter (Signed)
Prior authorization approved.   03/07/2020 - 09/21/2020

## 2020-03-08 ENCOUNTER — Telehealth: Payer: Self-pay | Admitting: *Deleted

## 2020-03-08 MED ORDER — TRAMADOL HCL 50 MG PO TABS: 50.0000 mg | ORAL_TABLET | Freq: Three times a day (TID) | ORAL | 5 refills | Status: DC | PRN

## 2020-03-08 NOTE — Telephone Encounter (Signed)
Marissa Contreras called becaseu she had not heard if we called her Tramadol to [facility].  It has been moved and is ready. Notified Marissa Contreras.

## 2020-03-29 ENCOUNTER — Other Ambulatory Visit: Payer: Self-pay | Admitting: Physical Medicine and Rehabilitation

## 2020-04-13 ENCOUNTER — Other Ambulatory Visit: Payer: Self-pay

## 2020-04-13 ENCOUNTER — Encounter: Payer: Self-pay | Admitting: Physical Medicine and Rehabilitation

## 2020-04-13 ENCOUNTER — Encounter
Payer: Medicaid Other | Attending: Physical Medicine and Rehabilitation | Admitting: Physical Medicine and Rehabilitation

## 2020-04-13 VITALS — BP 128/87 | HR 91 | Temp 98.9°F | Ht 66.0 in | Wt 175.0 lb

## 2020-04-13 DIAGNOSIS — M797 Fibromyalgia: Secondary | ICD-10-CM | POA: Insufficient documentation

## 2020-04-13 DIAGNOSIS — Z79891 Long term (current) use of opiate analgesic: Secondary | ICD-10-CM | POA: Diagnosis present

## 2020-04-13 DIAGNOSIS — R5383 Other fatigue: Secondary | ICD-10-CM | POA: Diagnosis not present

## 2020-04-13 DIAGNOSIS — G43719 Chronic migraine without aura, intractable, without status migrainosus: Secondary | ICD-10-CM

## 2020-04-13 DIAGNOSIS — M357 Hypermobility syndrome: Secondary | ICD-10-CM | POA: Insufficient documentation

## 2020-04-13 DIAGNOSIS — G8929 Other chronic pain: Secondary | ICD-10-CM | POA: Diagnosis present

## 2020-04-13 DIAGNOSIS — M7918 Myalgia, other site: Secondary | ICD-10-CM | POA: Insufficient documentation

## 2020-04-13 DIAGNOSIS — M5442 Lumbago with sciatica, left side: Secondary | ICD-10-CM | POA: Diagnosis present

## 2020-04-13 DIAGNOSIS — M5441 Lumbago with sciatica, right side: Secondary | ICD-10-CM | POA: Insufficient documentation

## 2020-04-13 DIAGNOSIS — Z5181 Encounter for therapeutic drug level monitoring: Secondary | ICD-10-CM | POA: Diagnosis present

## 2020-04-13 DIAGNOSIS — F331 Major depressive disorder, recurrent, moderate: Secondary | ICD-10-CM

## 2020-04-13 DIAGNOSIS — G894 Chronic pain syndrome: Secondary | ICD-10-CM | POA: Insufficient documentation

## 2020-04-13 MED ORDER — NALTREXONE HCL 50 MG PO TABS: 50.0000 mg | ORAL_TABLET | Freq: Every day | ORAL | 0 refills | Status: DC

## 2020-04-13 MED ORDER — PROMETHAZINE HCL 12.5 MG PO TABS: 12.5000 mg | ORAL_TABLET | Freq: Four times a day (QID) | ORAL | 5 refills | Status: DC | PRN

## 2020-04-13 MED ORDER — LIDOCAINE 5 % EX OINT: 1.0000 "application " | TOPICAL_OINTMENT | CUTANEOUS | 11 refills | Status: DC | PRN

## 2020-04-13 NOTE — Patient Instructions (Signed)
Patient is a 29 yr old R handed female with hx of anxiety and depression who here's for f/uof B/L low back pain with sciatica down B/L legs- down to toes. Likely herniated disc- causing a noxious stimulation ot spinal cord- reason likely steroids didn't work.Also has myofascial pain syndrome and fibromyalgia based on exam. Also has hypermobility syndrome.    1. No trigger point injections-only working for the first day. Will wait today, but can in future, add low dose injectable steroid to lidocaine to make it last longer.    2. Lyrica 50 mg 3x/day - wait on Lyrica dose for this visit. Maintain Gabapentin dose   3. [facility]- [phone]- in [place]- LDN (low dose naltrexone)- 2 mg daily x 1 week then 4 mg daily- 1 month supply. Will need refills- let me know when to call in.  Called in Rx-   4. No more tramadol-  Since interacts with LDN  5. Spent 20 minutes discussing LDN/low dose naltrexone.   6. Order TSH- hasn't been checked lately- so will check, esp since hypothyroidism can look like FMS.   7. Phenergan 12.5-25 mg q6 hours prn- for nausea-   8. Will send to PT in future to see exactly where ROM actually supposed to be.  Will do once things more stable.   9.Will wait on neck MRI-  Since hypermobile. Likely the cause.    10. Lidocaine ointment 5% up to 6x/day as needed.   11. F/u in 4-6 weeks.

## 2020-04-13 NOTE — Progress Notes (Signed)
Subjective:    Patient ID: Marissa Contreras, female    DOB: 04/27/1991, 29 y.o.   MRN: [id number]  HPI    Patient is a 29 yr old R handed female with hx of anxiety and depression who here's for f/uof B/L low back pain with sciatica down B/L legs- down to toes. Likely herniated disc- causing a noxious stimulation ot spinal cord- reason likely steroids didn't workAlso has myofascial pain syndrome and fibromyalgia based on exam.    Has had better weeks.   Injections are not helping much more than the day she gets them- is very well hydrated- urine is almost clear .   Tramadol- not helping at all.  Duloxetine has helped with nerve pain in arms and legs.   Now pain in low back, neck, shoulders, fingers, and feet- and the headaches   Taking the Zanaflex- 4x/day- doesn't sleep well, so doesn't knock her out more than 1 hour.   Was concerned about changing to Lyrica- doesn't want to stop gabapentin. Discussed that can put together, but usually put Lyrica on and wean Gabapentin.   Also has mild swelling in hands and feet. She wasn't sure what it was from.   Has new neck thing coming in mail- hasn't received yet- lay on it- works on trigger points.   Bought a new neck pillow and ice packs- has TENS machine- uses that- helps while its on, but not afterwards.  Has to be >1/2 way to make any difference- can hurt when does it.   Has missed 4 shifts this week due to pain- neck and headaches.   Has tried zofran and Phenergan- didn't work well- hasn't tried since pregnant  Has nerve pain that goes down arms- somewhat better with Duloxetine.    Can "pop" R shoulder- severe catch  Pain Inventory Average Pain 8 Pain Right Now 8 My pain is sharp, burning, dull, stabbing and tingling  In the last 24 hours, has pain interfered with the following? General activity 8 Relation with others 8 Enjoyment of life 8 What TIME of day is your pain at its worst? varies Sleep (in general)  Poor  Pain is worse with: walking, bending, sitting, inactivity, standing and some activites Pain improves with: heat/ice and medication Relief from Meds: 5  Mobility walk without assistance how many minutes can you walk? 30 ability to climb steps?  yes do you drive?  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  no  Physicians involved in your care Any changes since last visit?  no   Family History  Problem Relation Age of Onset   Hypertension Father    Diabetes Father    Fibromyalgia Mother    Arthritis Mother    Depression Mother    Diabetes Paternal Grandmother    Cancer Paternal Grandmother    Stroke Paternal Grandmother    Social History   Socioeconomic History   Marital status: Married    Spouse name: tommy robinson   Number of children: 2   Years of education: 12   Highest education level: 12th grade  Occupational History   Not on file  Tobacco Use   Smoking status: Former Smoker    Packs/day: 1.00    Types: Cigarettes  Quit date: 09/24/2017    Years since quitting: 2.5   Smokeless tobacco: Never Used   Tobacco comment: quit for pregnancy  Vaping Use   Vaping Use: Former  Substance and Sexual Activity   Alcohol use: No   Drug use: No   Sexual activity: Yes    Birth control/protection: None  Other Topics Concern   Not on file  Social History Narrative   Lives with husband and mother in [place].  Husband is 6 years older, works at [employer].  No hobbies, described herself as a "homebody"  Online courses for high school due to difficulty with social stresses.     Social Determinants of Health   Financial Resource Strain:     Difficulty of Paying Living Expenses:   Food Insecurity:    Worried About Programme researcher, broadcasting/film/video in the Last Year:    Barista in the Last Year:   Transportation Needs:    Freight forwarder (Medical):    Lack of Transportation (Non-Medical):   Physical Activity:    Days of Exercise per Week:    Minutes of Exercise per Session:   Stress:    Feeling of Stress :   Social Connections:    Frequency of Communication with Friends and Family:    Frequency of Social Gatherings with Friends and Family:    Attends Religious Services:    Active Member of Clubs or Organizations:    Attends Engineer, structural:    Marital Status:    Past Surgical History:  Procedure Laterality Date   DILATION AND CURETTAGE OF UTERUS     DILATION AND EVACUATION N/A 03/08/2014   Procedure: DILATATION AND EVACUATION ;  Surgeon: Purcell Nails, MD;  Location: [facility];  Service: Gynecology;  Laterality: N/A;  with chromosomal studies, sent    TONSILLECTOMY     TUBAL LIGATION Bilateral 05/10/2018   Procedure: POST PARTUM TUBAL LIGATION;  Surgeon: Adam Phenix, MD;  Location: [facility] SU[facility];  Service: Gynecology;  Laterality: Bilateral;   Past Medical History:  Diagnosis Date   Anxiety    Asthma    Breastfeeding (infant)    Chronic back pain    Depression    H/O varicella    Headache    Menorrhagia 08/19/2016   UTI (urinary tract infection) 09/09/2017   BP (!) 128/87    Pulse 91    Temp 98.9 F (37.2 C)    Ht 5\' 6"  (1.676 m)    Wt 175 lb (79.4 kg)    SpO2 98%    BMI 28.25 kg/m   Opioid Risk Score:   Fall Risk Score:  `1  Depression screen PHQ 2/9  Depression screen [facility] 2/9 04/13/2020 01/27/2020 09/26/2019 04/20/2019 04/26/2018 04/12/2018 03/29/2018  Decreased Interest 1 1 2  0 3 3 1   Down, Depressed, Hopeless 1 0 0 0 3 3 3   PHQ - 2 Score 2 1 2  0 6 6 4   Altered sleeping - 3 - - 3 3 3   Tired, decreased energy - 1 - - 3 3 3   Change in appetite - 1 - - 3 3 3    Feeling bad or failure about yourself  - 0 - - 3 3 2   Trouble concentrating - 0 - - 3 3 3   Moving slowly or fidgety/restless - 0 - - 3 3 3   Suicidal thoughts - 0 - - 0 0 0  PHQ-9 Score - 6 - - 24 24 21   Difficult  doing work/chores - Somewhat difficult - - - - -  Some recent data might be hidden     Review of Systems  Constitutional: Positive for appetite change, diaphoresis and unexpected weight change.  Respiratory: Positive for cough.   Gastrointestinal: Positive for abdominal pain, constipation, diarrhea and nausea.  Musculoskeletal: Positive for gait problem.       Spasms  Neurological: Positive for dizziness, weakness and numbness.  Psychiatric/Behavioral: Positive for confusion, dysphoric mood and suicidal ideas. The patient is nervous/anxious.   All other systems reviewed and are negative.      Objective:   Physical Exam  Awake, alert, appropriate, accompanied by husband, NAD Hypermobile in fingers- hyperextends elbows, knees, fingers/MCPs- can touch thumb to wrist B/L Neck- can put chin towards chest almost- great ROM of flexion, extension, rotation/side bending.  TTP in FMS areas.     Assessment & Plan:   Patient is a 29 yr old R handed female with hx of anxiety and depression who here's for f/uof B/L low back pain with sciatica down B/L legs- down to toes. Likely herniated disc- causing a noxious stimulation ot spinal cord- reason likely steroids didn't work.Also has myofascial pain syndrome and fibromyalgia based on exam. Also has hypermobility syndrome.    1. No trigger point injections-only working for the first day. Will wait today, but can in future, add low dose injectable steroid to lidocaine to make it last longer.    2. Lyrica 50 mg 3x/day - wait on Lyrica dose for this visit. Maintain Gabapentin dose   3. [facility]- [phone]- in [place]- LDN (low dose naltrexone)- 2 mg daily x 1 week then 4 mg daily- 1 month supply. Will need  refills- let me know when to call in.  Called in Rx-   4. No more tramadol-  Since interacts with LDN  5. Spent 20 minutes discussing LDN/low dose naltrexone.   6. Order TSH- hasn't been checked lately- so will check, esp since hypothyroidism can look like FMS.   7. Phenergan 12.5-25 mg q6 hours prn- for nausea-   8. Will send to PT in future to see exactly where ROM actually supposed to be.  Will do once things more stable.   9.Will wait on neck MRI-  Since hypermobile. Likely the cause.    10. Lidocaine ointment 5% up to 6x/day as needed.   11. F/u in 4-6 weeks.   I spent a total of 45 minutes on visit- as detailed above.

## 2020-05-14 ENCOUNTER — Encounter: Payer: Medicaid Other | Admitting: Physical Medicine and Rehabilitation

## 2020-05-16 ENCOUNTER — Encounter
Payer: Medicaid Other | Attending: Physical Medicine and Rehabilitation | Admitting: Physical Medicine and Rehabilitation

## 2020-05-16 DIAGNOSIS — M7918 Myalgia, other site: Secondary | ICD-10-CM | POA: Insufficient documentation

## 2020-05-16 DIAGNOSIS — G8929 Other chronic pain: Secondary | ICD-10-CM | POA: Insufficient documentation

## 2020-05-16 DIAGNOSIS — M5441 Lumbago with sciatica, right side: Secondary | ICD-10-CM | POA: Insufficient documentation

## 2020-05-16 DIAGNOSIS — Z5181 Encounter for therapeutic drug level monitoring: Secondary | ICD-10-CM | POA: Insufficient documentation

## 2020-05-16 DIAGNOSIS — M5442 Lumbago with sciatica, left side: Secondary | ICD-10-CM | POA: Insufficient documentation

## 2020-05-16 DIAGNOSIS — Z79891 Long term (current) use of opiate analgesic: Secondary | ICD-10-CM | POA: Insufficient documentation

## 2020-05-16 DIAGNOSIS — G894 Chronic pain syndrome: Secondary | ICD-10-CM | POA: Insufficient documentation

## 2020-05-16 DIAGNOSIS — R5383 Other fatigue: Secondary | ICD-10-CM | POA: Insufficient documentation

## 2020-05-16 DIAGNOSIS — M797 Fibromyalgia: Secondary | ICD-10-CM | POA: Insufficient documentation

## 2020-05-25 ENCOUNTER — Ambulatory Visit: Payer: Medicaid Other | Admitting: Physical Medicine and Rehabilitation

## 2020-06-04 ENCOUNTER — Ambulatory Visit: Payer: Medicaid Other | Admitting: Physical Medicine and Rehabilitation

## 2020-06-24 ENCOUNTER — Other Ambulatory Visit: Payer: Self-pay | Admitting: Family Medicine

## 2020-06-24 ENCOUNTER — Other Ambulatory Visit: Payer: Self-pay | Admitting: Physical Medicine and Rehabilitation

## 2020-07-04 ENCOUNTER — Encounter: Payer: Self-pay | Admitting: Family Medicine

## 2020-07-05 NOTE — Telephone Encounter (Signed)
Called patient and LVM to call office and schedule an appointment for refills.  Marissa Contreras, [name]

## 2020-07-20 ENCOUNTER — Telehealth: Payer: Self-pay

## 2020-07-20 NOTE — Telephone Encounter (Signed)
OK- I called [facility]- they have no record of her, or any Naltrexone order/prescription.  So, please help Monday to figure this out, or talk to pt, and I'm happy to send it on.  Thanks :) ML

## 2020-07-20 NOTE — Telephone Encounter (Signed)
Female called and states 10.28.21 at 2:40 pm that patient does not have appt until Nov 19 and needs all meds refilled - she is out of everything call in to [facility] -call back for patient [phone]

## 2020-07-20 NOTE — Telephone Encounter (Signed)
I called and left message on her VM to call us back. . Looks like most medications Dr Berline Chough prescribes has refills left through at least November.  Per the PMP the tramadol was last filled 04/09/20 and has refills left.  Naloxone is not listed.

## 2020-07-20 NOTE — Telephone Encounter (Signed)
I attempted to call- but her regular number- I left VM and her number on the message said wasn't accepting new calls- I can call in Naltrexone but [facility]- has to [facility] to [facility] and  other meds have refills. Let me know what to do, ML

## 2020-07-23 ENCOUNTER — Emergency Department (HOSPITAL_COMMUNITY)
Admission: EM | Admit: 2020-07-23 | Discharge: 2020-07-23 | Disposition: A | Discharge status: Home / Self Care | Payer: Medicaid Other | Attending: Emergency Medicine | Admitting: Emergency Medicine

## 2020-07-23 ENCOUNTER — Emergency Department (HOSPITAL_COMMUNITY): Payer: Medicaid Other

## 2020-07-23 ENCOUNTER — Encounter (HOSPITAL_COMMUNITY): Payer: Self-pay

## 2020-07-23 DIAGNOSIS — Z5321 Procedure and treatment not carried out due to patient leaving prior to being seen by health care provider: Secondary | ICD-10-CM | POA: Diagnosis not present

## 2020-07-23 DIAGNOSIS — R110 Nausea: Secondary | ICD-10-CM | POA: Insufficient documentation

## 2020-07-23 DIAGNOSIS — R109 Unspecified abdominal pain: Secondary | ICD-10-CM | POA: Insufficient documentation

## 2020-07-23 DIAGNOSIS — R079 Chest pain, unspecified: Secondary | ICD-10-CM | POA: Diagnosis present

## 2020-07-23 LAB — COMPREHENSIVE METABOLIC PANEL
ALT: 48 U/L — ABNORMAL HIGH (ref 0–44)
AST: 100 U/L — ABNORMAL HIGH (ref 15–41)
Albumin: 4 g/dL (ref 3.5–5.0)
Alkaline Phosphatase: 69 U/L (ref 38–126)
Anion gap: 12 (ref 5–15)
BUN: 9 mg/dL (ref 6–20)
CO2: 19 mmol/L — ABNORMAL LOW (ref 22–32)
Calcium: 9.3 mg/dL (ref 8.9–10.3)
Chloride: 107 mmol/L (ref 98–111)
Creatinine, Ser: 0.72 mg/dL (ref 0.44–1.00)
GFR, Estimated: >60 mL/min (ref >60)
Glucose, Bld: 107 mg/dL — ABNORMAL HIGH (ref 70–99)
Potassium: 3.5 mmol/L (ref 3.5–5.1)
Sodium: 138 mmol/L (ref 135–145)
Total Bilirubin: 0.7 mg/dL (ref 0.3–1.2)
Total Protein: 6.2 g/dL — ABNORMAL LOW (ref 6.5–8.1)

## 2020-07-23 LAB — CBC
HCT: 41.8 % (ref 36.0–46.0)
Hemoglobin: 13.3 g/dL (ref 12.0–15.0)
MCH: 29 pg (ref 26.0–34.0)
MCHC: 31.8 g/dL (ref 30.0–36.0)
MCV: 91.3 fL (ref 80.0–100.0)
Platelets: 237 10*3/uL (ref 150–400)
RBC: 4.58 MIL/uL (ref 3.87–5.11)
RDW: 12.8 % (ref 11.5–15.5)
WBC: 6.8 10*3/uL (ref 4.0–10.5)
nRBC: 0 % (ref 0.0–0.2)

## 2020-07-23 LAB — I-STAT BETA HCG BLOOD, ED (MC, WL, AP ONLY): I-stat hCG, quantitative: <5 m[IU]/mL (ref <5)

## 2020-07-23 LAB — TROPONIN I (HIGH SENSITIVITY): Troponin I (High Sensitivity): 3 ng/L (ref <18)

## 2020-07-23 LAB — LIPASE, BLOOD: Lipase: 39 U/L (ref 11–51)

## 2020-07-23 NOTE — ED Notes (Signed)
Called pt no answer °

## 2020-07-23 NOTE — ED Notes (Addendum)
Called pt, no answer.

## 2020-07-23 NOTE — ED Triage Notes (Signed)
Pt from home with ems c.o mid abd pain that radiates to her chest that started after she woke up from a nap today. Pt also c.o nausea. Pt given 0.4 Nitro, 324 ASA and 4 zofran en route. No improvement of symptoms. VSS. Pt a.o

## 2020-07-26 ENCOUNTER — Ambulatory Visit (INDEPENDENT_AMBULATORY_CARE_PROVIDER_SITE_OTHER): Payer: Medicaid Other | Admitting: Family Medicine

## 2020-07-26 ENCOUNTER — Other Ambulatory Visit: Payer: Self-pay | Admitting: Family Medicine

## 2020-07-26 ENCOUNTER — Other Ambulatory Visit: Payer: Self-pay | Admitting: Physical Medicine and Rehabilitation

## 2020-07-26 ENCOUNTER — Other Ambulatory Visit: Payer: Self-pay

## 2020-07-26 VITALS — BP 102/64 | HR 91 | Wt 175.2 lb

## 2020-07-26 DIAGNOSIS — R7989 Other specified abnormal findings of blood chemistry: Secondary | ICD-10-CM | POA: Diagnosis present

## 2020-07-26 DIAGNOSIS — G43719 Chronic migraine without aura, intractable, without status migrainosus: Secondary | ICD-10-CM

## 2020-07-26 DIAGNOSIS — K8020 Calculus of gallbladder without cholecystitis without obstruction: Secondary | ICD-10-CM

## 2020-07-26 DIAGNOSIS — Z1159 Encounter for screening for other viral diseases: Secondary | ICD-10-CM | POA: Diagnosis not present

## 2020-07-26 DIAGNOSIS — F331 Major depressive disorder, recurrent, moderate: Secondary | ICD-10-CM

## 2020-07-26 NOTE — Telephone Encounter (Signed)
I attempted to reach Marissa Contreras to confirm whether she had gotten the naltrexone and question her about her medications.  We have a refill on gabapentin and duloxitine from pharmacy, but it is unclear if she takes the duloxetine or the venlafaxine.  I was unable to leave a VM (VM FULL) but it did allow an sms text to be sent with our number on it, so maybe she will call the office.

## 2020-07-26 NOTE — Patient Instructions (Addendum)
We are going to recheck your liver function. Continue to make sure you cut down your Tylenol 204,000 mg a day.  See if you do have refills of your tramadol.  We should certainly discuss either through our clinic or with your pain specialist about a agent to help reduce the amount of headaches you have per month as this is quite a lot for you.  Pending on what your lab results show is if we will get some extra imaging, we will talk about follow-up after the results.  If you have any further severe abdominal pain, nausea/vomiting please follow-up in the ED.

## 2020-07-26 NOTE — Assessment & Plan Note (Signed)
Chronic and poorly controlled.  Currently taking Effexor daily, previously failed several different medications in the past with discussion on evaluation via psychiatry.  Aware of crisis hotline, should follow-up in the next several weeks.  Will call patient in the next few days as well to check in.

## 2020-07-26 NOTE — Assessment & Plan Note (Signed)
Chronic and frequent, with significant OTC Tylenol use.  Previously using low dose naltrexone (additionally indicated for fibromyalgia etc.) through her PM&R specialist with improvement, however does not have follow-up until 11/19.  May use her home tramadol in the interim while off of naltrexone.  Certainly needs to follow-up in the next few weeks through our clinic or with her pain specialist to discuss migraine prophylactic therapy.

## 2020-07-26 NOTE — Progress Notes (Signed)
SUBJECTIVE:   CHIEF COMPLAINT / HPI: "f/u high liver tests in ED"   Marissa Contreras is a 29 year old female presenting discussed the following:  Abdominal pain: Started on Monday, epigastric and would move up her chest.  She reports she was taking a nap and then this discomfort woke her up. Felt very sharp, constant and lasted all that day, however resolved by the evening. Laying down made it better, sitting up worse.  Has not happened since Monday.  She ate a few fun sized snickers and a few sips of soda prior to this occurring.  She has not had this happen before.  Does have a history of a cholecystectomy over 1 year ago.  Felt a little bit nauseous while in EMS, otherwise no associated fever, chest pain, vomiting, difficulty breathing, or change in bowel movements.  Went to ED and had labs including a normal troponin, beta-hCG, lipase, and CBC.  CXR and EKG clear/NSR.  CMP showed AST 100, ALT 48--levels 13/17 approximately 1 year ago.  She reports for the past several weeks she has been using amount of Tylenol to help with her almost daily headaches.  She is not able to get in with her pain specialist until 11/19, previously was taking tramadol as needed and also trying naltrexone with some benefit.  She believes she may have some tramadol left but is not currently taking low-dose naltrexone anymore.  She reports taking 2500 mg of Tylenol approximately 2-3 times daily.  Depression: Reports she has been dealing with this since she was 24-44 years old.  Has no plan or intent.  Will just get overwhelmed at times and has thoughts that she be better off gone.  She feels stable currently and understands when she needs to seek help.  Aware of hotline.   PERTINENT  PMH / PSH: Mild intermittent asthma, history of cholelithiasis s/p cholecystectomy, migraines, fibromyalgia, chronic pain   OBJECTIVE:   BP 102/64   Pulse 91   Wt 175 lb 3.2 oz (79.5 kg)   LMP 06/16/2020 (Approximate)   SpO2 95%   BMI 28.28  kg/m General: Alert, NAD HEENT: NCAT, MMM,  Cardiac: RRR no m/g/r Lungs: Clear bilaterally, no increased WOB  Abdomen: soft, non-tender in all quadrants, negative Murphy sign, non-distended, normoactive BS throughout Msk: Moves all extremities spontaneously  Ext: Warm, dry, 2+ distal pulses  ASSESSMENT/PLAN:   Elevated LFTs Minimally elevated, AST/ALT at 100/48 respectively.  Strongly suspect related to significant acetaminophen use recently (~max 7,500 daily), doubt acetaminophen toxicity given minimal elevation at this time.  Reassured abdominal pain has resolved with benign exam today, doubtful for acute hepatitis and gallbladder pathology ruled out as s/p cholecystectomy.  Will repeat CMP today in addition to Tylenol level and CBC (eval plts).  Obtain hepatitis C screening as well, however low suspicion. Pending results--may proceed with RUQ U/S +/- additional labwork.   Migraine Chronic and frequent, with significant OTC Tylenol use.  Previously using low dose naltrexone (additionally indicated for fibromyalgia etc.) through her PM&R specialist with improvement, however does not have follow-up until 11/19.  May use her home tramadol in the interim while off of naltrexone.  Certainly needs to follow-up in the next few weeks through our clinic or with her pain specialist to discuss migraine prophylactic therapy.  Depression Chronic and poorly controlled.  Currently taking Effexor daily, previously failed several different medications in the past with discussion on evaluation via psychiatry.  Aware of crisis hotline, should follow-up in the next several weeks.  Will call patient in the next few days as well to check in.    Follow-up pending labs, otherwise regardless should follow-up in our office in the next 2-3 weeks for above and additional chronic conditions.  Allayne Stack, DO [facility]

## 2020-07-26 NOTE — Assessment & Plan Note (Addendum)
Minimally elevated, AST/ALT at 100/48 respectively.  Strongly suspect related to significant acetaminophen use recently (~max 7,500 daily), doubt acetaminophen toxicity given minimal elevation at this time.  Reassured abdominal pain has resolved with benign exam today, doubtful for acute hepatitis and gallbladder pathology ruled out as s/p cholecystectomy.  Will repeat CMP today in addition to Tylenol level and CBC (eval plts).  Obtain hepatitis C screening as well, however low suspicion. Pending results--may proceed with RUQ U/S +/- additional labwork.

## 2020-07-27 ENCOUNTER — Other Ambulatory Visit: Payer: Self-pay

## 2020-07-27 NOTE — Telephone Encounter (Signed)
Patient calls nurse line requesting a refill on Promethazine. Patient reports she has an apt with her pain doctor soon, however she is out and they will not refill any meds until apt. Will forward to PCP to advise.

## 2020-07-27 NOTE — Telephone Encounter (Signed)
Refill request for Duloxetine and Gabapentin. Duloxetine says discontinued on 07/20/2020. Please advice if refills approved.

## 2020-07-28 LAB — CBC
Hematocrit: 41.9 % (ref 34.0–46.6)
Hemoglobin: 14.4 g/dL (ref 11.1–15.9)
MCH: 31.2 pg (ref 26.6–33.0)
MCHC: 34.4 g/dL (ref 31.5–35.7)
MCV: 91 fL (ref 79–97)
Platelets: 264 10*3/uL (ref 150–450)
RBC: 4.61 x10E6/uL (ref 3.77–5.28)
RDW: 12.1 % (ref 11.7–15.4)
WBC: 6.2 10*3/uL (ref 3.4–10.8)

## 2020-07-28 LAB — COMPREHENSIVE METABOLIC PANEL
ALT: 58 IU/L — ABNORMAL HIGH (ref 0–32)
AST: 17 IU/L (ref 0–40)
Albumin/Globulin Ratio: 2.2 (ref 1.2–2.2)
Albumin: 4.4 g/dL (ref 3.9–5.0)
Alkaline Phosphatase: 94 IU/L (ref 44–121)
BUN/Creatinine Ratio: 14 (ref 9–23)
BUN: 10 mg/dL (ref 6–20)
Bilirubin Total: 0.2 mg/dL (ref 0.0–1.2)
CO2: 23 mmol/L (ref 20–29)
Calcium: 9.1 mg/dL (ref 8.7–10.2)
Chloride: 103 mmol/L (ref 96–106)
Creatinine, Ser: 0.69 mg/dL (ref 0.57–1.00)
GFR calc Af Amer: 138 mL/min/{1.73_m2} (ref >59)
GFR calc non Af Amer: 120 mL/min/{1.73_m2} (ref >59)
Globulin, Total: 2 g/dL (ref 1.5–4.5)
Glucose: 89 mg/dL (ref 65–99)
Potassium: 5 mmol/L (ref 3.5–5.2)
Sodium: 138 mmol/L (ref 134–144)
Total Protein: 6.4 g/dL (ref 6.0–8.5)

## 2020-07-28 LAB — HCV INTERPRETATION

## 2020-07-28 LAB — ACETAMINOPHEN LEVEL: Acetaminophen (Tylenol), S: NOT DETECTED ug/mL (ref 10–30)

## 2020-07-28 LAB — HCV AB W REFLEX TO QUANT PCR: HCV Ab: <0.1 s/co ratio (ref 0.0–0.9)

## 2020-07-29 MED ORDER — PROMETHAZINE HCL 12.5 MG PO TABS
12.5000 mg | ORAL_TABLET | Freq: Four times a day (QID) | ORAL | 0 refills | Status: DC | PRN
Start: 2020-07-29 — End: 2020-09-05

## 2020-07-29 NOTE — Telephone Encounter (Signed)
Will fill enough to last patient until she sees her pain doctor this month

## 2020-08-01 ENCOUNTER — Encounter: Payer: Self-pay | Admitting: Family Medicine

## 2020-08-06 ENCOUNTER — Encounter (HOSPITAL_COMMUNITY): Payer: Self-pay

## 2020-08-06 ENCOUNTER — Other Ambulatory Visit: Payer: Self-pay

## 2020-08-06 ENCOUNTER — Ambulatory Visit (HOSPITAL_COMMUNITY)
Admission: EM | Admit: 2020-08-06 | Discharge: 2020-08-06 | Disposition: A | Discharge status: Home / Self Care | Payer: Medicaid Other | Attending: Urgent Care | Admitting: Urgent Care

## 2020-08-06 DIAGNOSIS — M542 Cervicalgia: Secondary | ICD-10-CM

## 2020-08-06 DIAGNOSIS — G44209 Tension-type headache, unspecified, not intractable: Secondary | ICD-10-CM

## 2020-08-06 MED ORDER — TIZANIDINE HCL 4 MG PO TABS: 4.0000 mg | ORAL_TABLET | Freq: Every day | ORAL | 0 refills | Status: DC

## 2020-08-06 MED ORDER — NAPROXEN 500 MG PO TABS: 500.0000 mg | ORAL_TABLET | Freq: Two times a day (BID) | ORAL | 0 refills | Status: DC

## 2020-08-06 NOTE — ED Provider Notes (Signed)
[facility] Contreras [facility]   MRN: [id number] DOB: 08-Jan-1991  Subjective:   Marissa Contreras is a 29 y.o. female presenting for 1 day history of neck pain, posterior headache that is hurting her eyes as well.  Patient was horse playing with her daughter and she actually ended up jumping on her upper back yesterday.  Has used tramadol with minimal relief.  Denies loss consciousness, vision change, confusion, weakness, numbness or tingling, radicular symptoms.  Has a history of fibromyalgia, myofascial pain hypermobility syndrome.  No current facility-administered medications for this encounter.  Current Outpatient Medications:    DULoxetine (CYMBALTA) 30 MG capsule, TAKE 2 CAPSULES BY MOUTH EVERY DAY FOR NERVE PAIN, Disp: 180 capsule, Rfl: 0   gabapentin (NEURONTIN) 600 MG tablet, TAKE 1 TABLET BY MOUTH 3 TIMES DAILY. X 1 WEEK THEN IF NOT ENOUGH, WILL INCREASE TO 600 MG 3XDAY AND 1200 MG AT NIGHT-, Disp: 150 tablet, Rfl: 5   promethazine (PHENERGAN) 12.5 MG tablet, Take 1-2 tablets (12.5-25 mg total) by mouth every 6 (six) hours as needed for nausea or vomiting., Disp: 30 tablet, Rfl: 0   tiZANidine (ZANAFLEX) 4 MG tablet, Take 1 tablet (4 mg total) by mouth every 6 (six) hours as needed for muscle spasms., Disp: 120 tablet, Rfl: 5   traMADol (ULTRAM) 50 MG tablet, Take 1 tablet (50 mg total) by mouth 3 (three) times daily as needed., Disp: 90 tablet, Rfl: 5   venlafaxine XR (EFFEXOR-XR) 150 MG 24 hr capsule, TAKE 1 CAPSULE BY MOUTH EVERY DAY WITH BREAKFAST, Disp: 90 capsule, Rfl: 1   acetaminophen (TYLENOL) 325 MG tablet, Take 2 tablets (650 mg total) by mouth every 4 (four) hours as needed (for pain scale < 4)., Disp: 30 tablet, Rfl: 0   albuterol (PROVENTIL HFA;[name] 108 (90 Base) MCG/ACT inhaler, Inhale 2 puffs into the lungs every 6 (six) hours as needed for wheezing or shortness of breath., Disp: 1 Inhaler, Rfl: 2   ibuprofen (ADVIL,MOTRIN) 600 MG tablet, Take 1 tablet (600  mg total) by mouth every 6 (six) hours., Disp: 30 tablet, Rfl: 0   lidocaine (XYLOCAINE) 5 % ointment, Apply 1 application topically as needed. Apply to hands and feet up to 6x/day as needed for nerve pain, Disp: 50 g, Rfl: 11   naltrexone (DEPADE) 50 MG tablet, Take 1 tablet (50 mg total) by mouth daily. Actually 2 mg daily x 1 week then 40 mg daily- low dose naltrexone- [facility], Disp: 1 tablet, Rfl: 0   predniSONE (DELTASONE) 10 MG tablet, Take 2 tablets (20 mg total) by mouth daily with breakfast. First two days take two tablets, then take one tablet for two days, then half a tablet for two days, Disp: 7 tablet, Rfl: 0   No Known Allergies  Past Medical History:  Diagnosis Date   Anxiety    Asthma    Breastfeeding (infant)    Chronic back pain    Depression    H/O varicella    Headache    Menorrhagia 08/19/2016   Neck pain 09/28/2018   Positive GBS test 04/18/2018   Right hip pain 04/20/2019   UTI (urinary tract infection) 09/09/2017   Vaginal delivery 05/09/2018   Please schedule this patient for Postpartum visit in: 4 weeks with the following provider: Any provider For C/S patients schedule nurse incision check in weeks 2 weeks: n/a Low risk pregnancy complicated by: none Delivery mode:  SVD Anticipated Birth Control:  BTL, planned for this  hospital stay  PP Procedures needed: none  Schedule Integrated BH visit: no      Past Surgical History:  Procedure Laterality Date   DILATION AND CURETTAGE OF UTERUS     DILATION AND EVACUATION N/A 03/08/2014   Procedure: DILATATION AND EVACUATION ;  Surgeon: Purcell Nails, MD;  Location: [facility];  Service: Gynecology;  Laterality: N/A;  with chromosomal studies, sent    TONSILLECTOMY     TUBAL LIGATION Bilateral 05/10/2018   Procedure: POST PARTUM TUBAL LIGATION;  Surgeon: Adam Phenix, MD;  Location: [facility];  Service: Gynecology;  Laterality: Bilateral;    Family History  Problem  Relation Age of Onset   Hypertension Father    Diabetes Father    Fibromyalgia Mother    Arthritis Mother    Depression Mother    Diabetes Paternal Grandmother    Cancer Paternal Grandmother    Stroke Paternal Grandmother     Social History   Tobacco Use   Smoking status: Former Smoker    Packs/day: 1.00    Types: Cigarettes    Quit date: 09/24/2017    Years since quitting: 2.8   Smokeless tobacco: Never Used   Tobacco comment: quit for pregnancy  Vaping Use   Vaping Use: Former  Substance Use Topics   Alcohol use: No   Drug use: No    ROS   Objective:   Vitals: BP 124/86 (BP Location: Right Arm)    Pulse 98    Temp 98.7 F (37.1 C) (Oral)    Resp 19    LMP 08/01/2020 (Approximate)    SpO2 97%    Breastfeeding No   Physical Exam Constitutional:      General: She is not in acute distress.    Appearance: Normal appearance. She is well-developed. She is not ill-appearing, toxic-appearing or diaphoretic.  HENT:     Head: Normocephalic and atraumatic.     Nose: Nose normal.     Mouth/Throat:     Mouth: Mucous membranes are moist.     Pharynx: Oropharynx is clear.  Eyes:     General: No scleral icterus.       Right eye: No discharge.        Left eye: No discharge.     Extraocular Movements: Extraocular movements intact.     Conjunctiva/sclera: Conjunctivae normal.     Pupils: Pupils are equal, round, and reactive to light.  Cardiovascular:     Rate and Rhythm: Normal rate.  Pulmonary:     Effort: Pulmonary effort is normal.  Musculoskeletal:     Cervical back: Normal range of motion and neck supple. Spasms and tenderness present. No swelling, edema, deformity, erythema, signs of trauma, lacerations, rigidity, torticollis, bony tenderness or crepitus. Pain with movement present. Normal range of motion.  Skin:    General: Skin is warm and dry.  Neurological:     General: No focal deficit present.     Mental Status: She is alert and oriented to  person, place, and time.     Cranial Nerves: No cranial nerve deficit.     Motor: No weakness.     Coordination: Romberg sign negative. Coordination normal.     Gait: Gait normal.     Deep Tendon Reflexes: Reflexes normal.  Psychiatric:        Mood and Affect: Mood normal.        Behavior: Behavior normal.        Thought Content: Thought content normal.  Judgment: Judgment normal.       Assessment and Plan :   I have reviewed the PDMP during this encounter.  1. Neck pain   2. Tension headache     We will manage conservatively for musculoskeletal type pain.  Physical exam findings and vital signs stable for outpatient management.  Use naproxen, tizanidine, hydrate well.  Okay to continue using tramadol as needed.  Deferred imaging given exam findings. Counseled patient on potential for adverse effects with medications prescribed/recommended today, ER and return-to-clinic precautions discussed, patient verbalized understanding.    Wallis Bamberg, [place] 08/06/20 1938

## 2020-08-06 NOTE — ED Triage Notes (Signed)
Pt in with head and neck pain that started last night after her daughter jumped on her back.  Pt has taken tramadol with no relief

## 2020-08-10 ENCOUNTER — Telehealth: Payer: Self-pay | Admitting: *Deleted

## 2020-08-10 ENCOUNTER — Encounter: Payer: Self-pay | Admitting: Physical Medicine and Rehabilitation

## 2020-08-10 ENCOUNTER — Encounter
Payer: Medicaid Other | Attending: Physical Medicine and Rehabilitation | Admitting: Physical Medicine and Rehabilitation

## 2020-08-10 ENCOUNTER — Other Ambulatory Visit: Payer: Self-pay

## 2020-08-10 VITALS — BP 131/88 | HR 87 | Temp 98.1°F | Ht 66.0 in | Wt 174.0 lb

## 2020-08-10 DIAGNOSIS — G8929 Other chronic pain: Secondary | ICD-10-CM | POA: Insufficient documentation

## 2020-08-10 DIAGNOSIS — M5442 Lumbago with sciatica, left side: Secondary | ICD-10-CM | POA: Diagnosis present

## 2020-08-10 DIAGNOSIS — M7918 Myalgia, other site: Secondary | ICD-10-CM | POA: Insufficient documentation

## 2020-08-10 DIAGNOSIS — M357 Hypermobility syndrome: Secondary | ICD-10-CM | POA: Insufficient documentation

## 2020-08-10 DIAGNOSIS — G43519 Persistent migraine aura without cerebral infarction, intractable, without status migrainosus: Secondary | ICD-10-CM | POA: Diagnosis present

## 2020-08-10 DIAGNOSIS — M797 Fibromyalgia: Secondary | ICD-10-CM | POA: Diagnosis not present

## 2020-08-10 DIAGNOSIS — M5441 Lumbago with sciatica, right side: Secondary | ICD-10-CM | POA: Insufficient documentation

## 2020-08-10 MED ORDER — LIDOCAINE 5 % EX OINT: 1.0000 "application " | TOPICAL_OINTMENT | CUTANEOUS | 11 refills | Status: DC | PRN

## 2020-08-10 NOTE — Telephone Encounter (Signed)
Patient left a message stating that she forgot to talk to Dr. Berline Chough during her visit today about possibly having her prescribe a mild sleep aid medication to help with her sleep.

## 2020-08-10 NOTE — Progress Notes (Signed)
Patient is a 29 yr old R handed female with hx of anxiety and depression who here's forf/uof B/L low back pain with sciatica down B/L legs- down to toes. Likely herniated disc- causing a noxious stimulation ot spinal cord- reason likely steroids didn't work.Also has myofascial pain syndrome and fibromyalgia based on exam.Also has hypermobility syndrome.    Missed work yesterday.  Due to chronic neck pain.   Nerve pain in jaw ever since wisdom teeth out-  Went to dentist- gabapentin usually works, but triggered a migraine/neck pain.    Work requires doctor's note for migraine.  Needs note for missed work yesterday and today.   Brother got migraines and found a "spot on front of head"- and aunt had brain tumor.    Plan: 1. Patient here for trigger point injections for  Consent done and on chart.  Cleaned areas with alcohol and injected using a 27 gauge 1.5 inch needle  Injected 6cc total Using 1% Lidocaine with no EPI with 1cc of 40 mg Kenalog spread over 6cc of Lidocaine- to help prolong injections.    Upper traps B/L Levators B/L Posterior scalenes B/L Middle scalenes Splenius Capitus B/L Pectoralis Major B/L Rhomboids B/L and one on R Infraspinatus Teres Major/minor Thoracic paraspinals B/L Lumbar paraspinals B/L x3-  Other injections-    Patient's level of pain prior was 9/10 Current level of pain after injections is 4/10- mainly legs hurting now. Has been using rolling pins on legs.   There was no bleeding or complications. Did feel shaky a little these injections but resolved in < 5 minutes.    Patient was advised to drink a lot of water on day after injections to flush system Will have increased soreness for 12-48 hours after injections.  Can use Lidocaine patches the day AFTER injections Can use theracane on day of injections in places didn't inject Can use heating pad 4-6 hours AFTER injections  2. Will get CT of head since pt family hx is significant  from migraines/brain tumor. Just to make sure we aren't missing anything.  3. Refill of low dose naltrexone- 4mg  daily- 90 days supply- 1 refill so 6 months refills. Sent to .   4.  Doesn't need other refills except Lidocaine ointment q4 hours prn- sent in   5. Has had difficulty with missing meds of Gabapentin and Effexor- not opiates, and too much Cymbalta.  From [facility].   6. F/U- 2 months.   I spent a total of 30 minutes on visit- as detailed above.

## 2020-08-10 NOTE — Patient Instructions (Signed)
1. Patient here for trigger point injections for  Consent done and on chart.  Cleaned areas with alcohol and injected using a 27 gauge 1.5 inch needle  Injected 6cc total Using 1% Lidocaine with no EPI with 1cc of 40 mg Kenalog spread over 6cc of Lidocaine- to help prolong injections.    Upper traps B/L Levators B/L Posterior scalenes B/L Middle scalenes Splenius Capitus B/L Pectoralis Major B/L Rhomboids B/L and one on R Infraspinatus Teres Major/minor Thoracic paraspinals B/L Lumbar paraspinals B/L x3-  Other injections-    Patient's level of pain prior was 9/10 Current level of pain after injections is 4/10- mainly legs hurting now. Has been using rolling pins on legs.   There was no bleeding or complications. Did feel shaky a little these injections but resolved in < 5 minutes.    Patient was advised to drink a lot of water on day after injections to flush system Will have increased soreness for 12-48 hours after injections.  Can use Lidocaine patches the day AFTER injections Can use theracane on day of injections in places didn't inject Can use heating pad 4-6 hours AFTER injections  2. Will get CT of head since pt family hx is significant from migraines/brain tumor. Just to make sure we aren't missing anything.  3. Refill of low dose naltrexone- 4mg  daily- 90 days supply- 1 refill so 6 months refills. Sent to .   4.  Doesn't need other refills except Lidocaine ointment q4 hours prn- sent in   5. Has had difficulty with missing meds of Gabapentin and Effexor- not opiates, and too much Cymbalta.  From [facility].   6. F/U- 2 months.

## 2020-08-15 NOTE — Telephone Encounter (Signed)
L/M on voicemail  Pt called asking for sleep aid, but don't know what she's tried in past that did/didn't work.  If she lets me know, we can prescribe something for her.   Just have her call back and leave a message and will call into pharmacy.

## 2020-08-20 MED ORDER — TRAZODONE HCL 50 MG PO TABS
25.0000 mg | ORAL_TABLET | Freq: Every evening | ORAL | 5 refills | Status: DC | PRN
Start: 2020-08-20 — End: 2020-09-11

## 2020-08-22 NOTE — Telephone Encounter (Signed)
Patient states already taken care of. Medication called in.

## 2020-08-27 ENCOUNTER — Other Ambulatory Visit: Payer: Self-pay

## 2020-08-27 ENCOUNTER — Encounter (HOSPITAL_COMMUNITY): Payer: Self-pay | Admitting: Family Medicine

## 2020-08-27 ENCOUNTER — Ambulatory Visit (HOSPITAL_COMMUNITY)
Admission: EM | Admit: 2020-08-27 | Discharge: 2020-08-27 | Disposition: A | Discharge status: Home / Self Care | Payer: Medicaid Other | Attending: Family Medicine | Admitting: Family Medicine

## 2020-08-27 DIAGNOSIS — M791 Myalgia, unspecified site: Secondary | ICD-10-CM

## 2020-08-27 HISTORY — DX: Fibromyalgia: M79.7

## 2020-08-27 MED ORDER — DEXAMETHASONE 4 MG PO TABS: 4.0000 mg | ORAL_TABLET | Freq: Two times a day (BID) | ORAL | 0 refills | Status: DC

## 2020-08-27 NOTE — ED Triage Notes (Signed)
Pt reports having muscle spasms "all over" the body aches and fatigue since last night. States she has fibromyalgia and is taking muscle spasm for the past 10 years.

## 2020-08-27 NOTE — ED Provider Notes (Signed)
[facility]    CSN: [id number] Arrival date & time: 08/27/20  1359      History   Chief Complaint Chief Complaint  Patient presents with  . Spasms    HPI Marissa Contreras is a 29 y.o. female.   This is a 29 year old woman who was seen here 3 weeks ago.  She is being treated in pain management for fibromyalgia and is taking multiple medications for it.  She was at work yesterday at [employer] and had to leave early because of muscle twitching.  She describes muscle twitching as fasciculations and spasms like cramps.  These are involving her "ribs", her back, her arms, and neck.  She has had no fever.  She has no shortness of breath or chest pain.  She has no nausea but she did vomit last night one time.  She has had no diarrhea.  She is never had this before.     Past Medical History:  Diagnosis Date  . Anxiety   . Asthma   . Breastfeeding (infant)   . Chronic back pain   . Depression   . Fibromyalgia   . H/O varicella   . Headache   . Menorrhagia 08/19/2016  . Neck pain 09/28/2018  . Positive GBS test 04/18/2018  . Right hip pain 04/20/2019  . UTI (urinary tract infection) 09/09/2017  . Vaginal delivery 05/09/2018   Please schedule this patient for Postpartum visit in: 4 weeks with the following provider: Any provider For C/S patients schedule nurse incision check in weeks 2 weeks: n/a Low risk pregnancy complicated by: none Delivery mode:  SVD Anticipated Birth Control:  BTL, planned for this hospital stay  PP Procedures needed: none  Schedule Integrated BH visit: no     Patient Active Problem List   Diagnosis Date Noted  . Elevated LFTs 07/26/2020  . 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  . Fibromyalgia 01/27/2020  . Myofascial pain 01/27/2020  . Fatigue 09/26/2019  . Cholelithiasis 06/08/2019  . Migraine 06/03/2017  . Mild intermittent asthma 12/20/2014  . Bilateral hip pain 12/08/2014  . Depression 12/20/2013  . Low back pain 02/11/2012    Past Surgical  History:  Procedure Laterality Date  . DILATION AND CURETTAGE OF UTERUS    . DILATION AND EVACUATION N/A 03/08/2014   Procedure: DILATATION AND EVACUATION ;  Surgeon: Purcell Nails, MD;  Location: [facility];  Service: Gynecology;  Laterality: N/A;  with chromosomal studies, sent   . TONSILLECTOMY    . TUBAL LIGATION Bilateral 05/10/2018   Procedure: POST PARTUM TUBAL LIGATION;  Surgeon: Adam Phenix, MD;  Location: [facility];  Service: Gynecology;  Laterality: Bilateral;    OB History    Gravida  7   Para  3   Term  3   Preterm      AB  4   Living  3     SAB  4   TAB      Ectopic      Multiple  0   Live Births  3            Home Medications    Prior to Admission medications   Medication Sig Start Date End Date Taking? Authorizing Provider  albuterol (PROVENTIL HFA;[name] 108 (90 Base) MCG/ACT inhaler Inhale 2 puffs into the lungs every 6 (six) hours as needed for wheezing or shortness of breath. 08/29/18   Ofilia Neas, PA-C  dexamethasone (DECADRON) 4 MG tablet Take 1 tablet (4 mg total)  by mouth 2 (two) times daily with a meal. 08/27/20   Elvina Sidle, MD  DULoxetine (CYMBALTA) 30 MG capsule TAKE 2 CAPSULES BY MOUTH EVERY DAY FOR NERVE PAIN 07/27/20   Lovorn, Aundra Millet, MD  gabapentin (NEURONTIN) 600 MG tablet TAKE 1 TABLET BY MOUTH 3 TIMES DAILY. X 1 WEEK THEN IF NOT ENOUGH, WILL INCREASE TO 600 MG 3XDAY AND 1200 MG AT NIGHT- 07/27/20   Lovorn, Aundra Millet, MD  ibuprofen (ADVIL,MOTRIN) 600 MG tablet Take 1 tablet (600 mg total) by mouth every 6 (six) hours. 05/11/18   Clayton Bibles C, CNM  lidocaine (XYLOCAINE) 5 % ointment Apply 1 application topically as needed. Apply to hands and feet up to 6x/day as needed for nerve pain 04/13/20   Lovorn, Aundra Millet, MD  lidocaine (XYLOCAINE) 5 % ointment Apply 1 application topically every 4 (four) hours as needed. For nerve pain 08/10/20   Lovorn, Aundra Millet, MD  naltrexone (DEPADE) 50 MG tablet Take 1 tablet (50 mg  total) by mouth daily. Actually 2 mg daily x 1 week then 40 mg daily- low dose naltrexone- compounded [facility] 04/13/20   Genice Rouge, MD  promethazine (PHENERGAN) 12.5 MG tablet Take 1-2 tablets (12.5-25 mg total) by mouth every 6 (six) hours as needed for nausea or vomiting. 07/29/20   Cora Collum, DO  tiZANidine (ZANAFLEX) 4 MG tablet Take 1 tablet (4 mg total) by mouth at bedtime. 08/06/20   Wallis Bamberg, PA-C  traZODone (DESYREL) 50 MG tablet Take 0.5-1 tablets (25-50 mg total) by mouth at bedtime as needed for sleep (take 1/2 to 1 full tabs as needed for sleep- most patients take ~ 1 hr prior to bed.). 08/20/20   Lovorn, Aundra Millet, MD  venlafaxine XR (EFFEXOR-XR) 150 MG 24 hr capsule TAKE 1 CAPSULE BY MOUTH EVERY DAY WITH BREAKFAST 08/02/20 10/31/20  Cora Collum, DO  venlafaxine XR (EFFEXOR XR) 37.5 MG 24 hr capsule Take 3 capsules (112.5 mg total) by mouth daily with breakfast. 12/02/19   Arlyce Harman, DO    Family History Family History  Problem Relation Age of Onset  . Hypertension Father   . Diabetes Father   . Fibromyalgia Mother   . Arthritis Mother   . Depression Mother   . Diabetes Paternal Grandmother   . Cancer Paternal Grandmother   . Stroke Paternal Grandmother     Social History Social History   Tobacco Use  . Smoking status: Former Smoker    Packs/day: 1.00    Types: Cigarettes    Quit date: 09/24/2017    Years since quitting: 2.9  . Smokeless tobacco: Never Used  . Tobacco comment: quit for pregnancy  Vaping Use  . Vaping Use: Former  Substance Use Topics  . Alcohol use: No  . Drug use: No     Allergies   Patient has no known allergies.   Review of Systems Review of Systems  Constitutional: Negative.   Gastrointestinal: Positive for vomiting.  Musculoskeletal: Positive for myalgias.  All other systems reviewed and are negative.    Physical Exam Triage Vital Signs ED Triage Vitals  Enc Vitals Group     BP      Pulse        Resp      Temp      Temp src      SpO2      Weight      Height      Head Circumference      Peak Flow  Pain Score      Pain Loc      Pain Edu?      Excl. in GC?    No data found.  Updated Vital Signs BP (!) 124/91 (BP Location: Left Arm)   Pulse 98   Temp 98.6 F (37 C) (Oral)   Resp 17   LMP 08/18/2020 (Exact Date)   SpO2 100%    Physical Exam Vitals and nursing note reviewed.  Constitutional:      Appearance: Normal appearance. She is normal weight.  HENT:     Head: Normocephalic.  Eyes:     Conjunctiva/sclera: Conjunctivae normal.  Pulmonary:     Effort: Pulmonary effort is normal.  Musculoskeletal:        General: Normal range of motion.     Cervical back: Normal range of motion and neck supple.  Skin:    General: Skin is warm and dry.  Neurological:     General: No focal deficit present.     Mental Status: She is alert and oriented to person, place, and time.  Psychiatric:        Mood and Affect: Mood normal.    Patient is in no distress and I cannot find anything wrong with her.  UC Treatments / Results  Labs (all labs ordered are listed, but only abnormal results are displayed) Labs Reviewed - No data to display  EKG   Radiology No results found.  Procedures Procedures (including critical care time)  Medications Ordered in UC Medications - No data to display  Initial Impression / Assessment and Plan / UC Course  I have reviewed the triage vital signs and the nursing notes.  Pertinent labs & imaging results that were available during my care of the patient were reviewed by me and considered in my medical decision making (see chart for details).    Final Clinical Impressions(s) / UC Diagnoses   Final diagnoses:  Myalgia     Discharge Instructions     Please follow-up with your fibromyalgia doctors if this continues.    ED Prescriptions    Medication Sig Dispense Auth. Provider   dexamethasone (DECADRON) 4 MG tablet  Take 1 tablet (4 mg total) by mouth 2 (two) times daily with a meal. 4 tablet Elvina Sidle, MD     I have reviewed the PDMP during this encounter.   Elvina Sidle, MD 08/27/20 [phone]

## 2020-08-27 NOTE — Discharge Instructions (Addendum)
Please follow-up with your fibromyalgia doctors if this continues.

## 2020-08-28 ENCOUNTER — Ambulatory Visit
Admission: RE | Admit: 2020-08-28 | Discharge: 2020-08-28 | Disposition: A | Discharge status: Home / Self Care | Payer: Medicaid Other | Source: Ambulatory Visit | Attending: Physical Medicine and Rehabilitation | Admitting: Physical Medicine and Rehabilitation

## 2020-08-28 DIAGNOSIS — G43519 Persistent migraine aura without cerebral infarction, intractable, without status migrainosus: Secondary | ICD-10-CM

## 2020-08-31 ENCOUNTER — Other Ambulatory Visit (HOSPITAL_COMMUNITY)
Admission: RE | Admit: 2020-08-31 | Discharge: 2020-08-31 | Disposition: A | Discharge status: Home / Self Care | Payer: Medicaid Other | Source: Ambulatory Visit | Attending: Family Medicine | Admitting: Family Medicine

## 2020-08-31 ENCOUNTER — Other Ambulatory Visit: Payer: Self-pay

## 2020-08-31 ENCOUNTER — Ambulatory Visit (INDEPENDENT_AMBULATORY_CARE_PROVIDER_SITE_OTHER): Payer: Medicaid Other | Admitting: Student in an Organized Health Care Education/Training Program

## 2020-08-31 VITALS — BP 110/80 | HR 85 | Ht 63.0 in | Wt 176.8 lb

## 2020-08-31 DIAGNOSIS — Z124 Encounter for screening for malignant neoplasm of cervix: Secondary | ICD-10-CM | POA: Insufficient documentation

## 2020-08-31 DIAGNOSIS — N926 Irregular menstruation, unspecified: Secondary | ICD-10-CM | POA: Diagnosis not present

## 2020-08-31 LAB — POCT WET PREP (WET MOUNT)
Clue Cells Wet Prep Whiff POC: NEGATIVE
Trichomonas Wet Prep HPF POC: ABSENT

## 2020-08-31 NOTE — Progress Notes (Signed)
   SUBJECTIVE:   CHIEF COMPLAINT / HPI: abnormal menstrual bleeding  H/o BTL 05/10/2018  Typically pretty regular.  Usually last for 3-4 days with heavy bleeding on first day and then tapers out.  Occasionally takes aspirin. Last time took was over a week ago. Was taking aspirin daily for headaches for a couple months for headaches. Had not taken before then regularly. currently having some mild bleeding. Occasional mild cramping. No vaginal irritation with increased mucus discharge. No pain with urination. No concerns for STDs.  No concerns for abuse, she feels safe in her relationship.  No breast tenderness or soreness.  Has chronic nausea and takes phenergan, unchanged.   Headaches worsening since neck injury. Has hypermobility and fibromyalgia. Cymbalta and gabapentin naltrexone. Muscle spasms have gotten worse for 2 weeks.  Had a tooth pulled about 10 days ago.   OBJECTIVE:   BP 110/80   Pulse 85   Ht 5\' 3"  (1.6 m)   Wt 176 lb 12.8 oz (80.2 kg)   LMP 08/18/2020 (Exact Date)   SpO2 96%   BMI 31.32 kg/m   General: NAD, pleasant, able to participate in exam Pelvic: mild cervical bleeding. No abnormal growths. No tenderness with exam or cervical motion.  Abdomen: soft, nontender, nondistended, no hepatic or splenomegaly, +BS Extremities: no edema. WWP. Skin: warm and dry, no rashes noted Neuro: alert and oriented, no focal deficits Psych: Normal affect and mood  ASSESSMENT/PLAN:   Irregular periods Typically regular.  Has had increased use of NSAIDs, particularly asa the past few months with more headaches. This is likely contributing to her abnormal cycles for 2 months.  She does not have anemia symptoms or other pregnancy systems and has BTL. Normal pelvic exam. Recommended she discontinue NSAIDs and continue to monitor her menstrual cycles for a couple more months and return for further evaluation if still irregular   Can use tylenol PRN for headaches  08/20/2020, DO [facility]

## 2020-08-31 NOTE — Patient Instructions (Signed)
It was a pleasure to see you today!  To summarize our discussion for this visit:  Your abnormal bleeding pattern is very likely related to the pain medications you have been using. Your pelvic exam was normal today.  I would recommend not taking medications that are NSAIDs such as ibuprofen or aleve and stop the aspirin for now. You can take tylenol as needed for pain as this should not effect your bleeding.   We can monitor for return to a more normal cycle over the next 2-4 months and if not improving with this plan, please come back.  We also collected specimen for pap smear and infections which I will message you with the results when returned next week.  Some additional health maintenance measures we should update are: Health Maintenance Due  Topic Date Due  . COVID-19 Vaccine (1) Never done  . INFLUENZA VACCINE  04/22/2020  . PAP-Cervical Cytology Screening  09/28/2020  . PAP SMEAR-Modifier  09/28/2020  .    Call the clinic at [phone] if your symptoms worsen or you have any concerns.   Thank you for allowing me to take part in your care,  Dr. Jamelle Rushing

## 2020-09-01 ENCOUNTER — Other Ambulatory Visit: Payer: Self-pay | Admitting: Physical Medicine and Rehabilitation

## 2020-09-02 DIAGNOSIS — N926 Irregular menstruation, unspecified: Secondary | ICD-10-CM | POA: Insufficient documentation

## 2020-09-02 NOTE — Assessment & Plan Note (Signed)
Typically regular.  Has had increased use of NSAIDs, particularly asa the past few months with more headaches. This is likely contributing to her abnormal cycles for 2 months.  She does not have anemia symptoms or other pregnancy systems and has BTL. Normal pelvic exam. Recommended she discontinue NSAIDs and continue to monitor her menstrual cycles for a couple more months and return for further evaluation if still irregular

## 2020-09-03 ENCOUNTER — Telehealth: Payer: Self-pay | Admitting: *Deleted

## 2020-09-03 LAB — CERVICOVAGINAL ANCILLARY ONLY
Chlamydia: NEGATIVE
Comment: NEGATIVE
Comment: NORMAL
Neisseria Gonorrhea: NEGATIVE

## 2020-09-03 NOTE — Telephone Encounter (Signed)
Marissa Contreras called to requests that Dr Berline Chough change her sleep medication as it is not very helpful.  She is also asking for a refill on her phenergan.

## 2020-09-04 LAB — CYTOLOGY - PAP: Diagnosis: NEGATIVE

## 2020-09-05 MED ORDER — AMITRIPTYLINE HCL 25 MG PO TABS
25.0000 mg | ORAL_TABLET | Freq: Every day | ORAL | 5 refills | Status: DC
Start: 2020-09-05 — End: 2020-09-28

## 2020-09-05 MED ORDER — PROMETHAZINE HCL 12.5 MG PO TABS: 12.5000 mg | ORAL_TABLET | Freq: Four times a day (QID) | ORAL | 5 refills | Status: DC | PRN

## 2020-09-05 NOTE — Telephone Encounter (Signed)
Will change trazodone for sleep to Elavil/Amitriptyline 25-50 mg nightly for sleep-  Also sent in Phenregan for nausea 5 RFs.

## 2020-09-05 NOTE — Telephone Encounter (Signed)
Notified. 

## 2020-09-11 ENCOUNTER — Other Ambulatory Visit: Payer: Self-pay | Admitting: Physical Medicine and Rehabilitation

## 2020-09-27 ENCOUNTER — Encounter: Payer: Self-pay | Admitting: Family Medicine

## 2020-09-27 MED ORDER — ALBUTEROL SULFATE HFA 108 (90 BASE) MCG/ACT IN AERS: 2.0000 | INHALATION_SPRAY | Freq: Four times a day (QID) | RESPIRATORY_TRACT | 2 refills | Status: DC | PRN

## 2020-09-28 ENCOUNTER — Other Ambulatory Visit: Payer: Self-pay | Admitting: Physical Medicine and Rehabilitation

## 2020-10-05 ENCOUNTER — Ambulatory Visit: Payer: Medicaid Other | Admitting: Physical Medicine and Rehabilitation

## 2020-10-05 ENCOUNTER — Telehealth: Payer: Self-pay | Admitting: *Deleted

## 2020-10-05 MED ORDER — GABAPENTIN 600 MG PO TABS: ORAL_TABLET | ORAL | 0 refills | Status: DC

## 2020-10-05 NOTE — Telephone Encounter (Signed)
Marissa Contreras called and she left her gabapentin at her mother's home out of town. She request we send in a new refill.  I have advised her that we can do that but her insurance may not cover since she has filled it once this month. I advise she check good rx if has to pay out of pocket.

## 2020-10-22 ENCOUNTER — Encounter
Payer: Medicaid Other | Attending: Physical Medicine and Rehabilitation | Admitting: Physical Medicine and Rehabilitation

## 2020-10-22 DIAGNOSIS — M797 Fibromyalgia: Secondary | ICD-10-CM | POA: Insufficient documentation

## 2020-10-22 DIAGNOSIS — M5441 Lumbago with sciatica, right side: Secondary | ICD-10-CM | POA: Insufficient documentation

## 2020-10-22 DIAGNOSIS — G43519 Persistent migraine aura without cerebral infarction, intractable, without status migrainosus: Secondary | ICD-10-CM | POA: Insufficient documentation

## 2020-10-22 DIAGNOSIS — G8929 Other chronic pain: Secondary | ICD-10-CM | POA: Insufficient documentation

## 2020-10-22 DIAGNOSIS — M5442 Lumbago with sciatica, left side: Secondary | ICD-10-CM | POA: Insufficient documentation

## 2020-10-22 DIAGNOSIS — M7918 Myalgia, other site: Secondary | ICD-10-CM | POA: Insufficient documentation

## 2020-10-22 DIAGNOSIS — M357 Hypermobility syndrome: Secondary | ICD-10-CM | POA: Insufficient documentation

## 2020-10-31 ENCOUNTER — Encounter: Payer: Medicaid Other | Admitting: Physical Medicine and Rehabilitation

## 2020-11-02 ENCOUNTER — Other Ambulatory Visit: Payer: Self-pay

## 2020-11-02 MED ORDER — DULOXETINE HCL 30 MG PO CPEP
30.0000 mg | ORAL_CAPSULE | Freq: Two times a day (BID) | ORAL | 1 refills | Status: DC
Start: 2020-11-02 — End: 2021-03-21

## 2020-11-02 MED ORDER — GABAPENTIN 600 MG PO TABS
ORAL_TABLET | ORAL | 1 refills | Status: DC
Start: 2020-11-02 — End: 2020-12-31

## 2020-12-07 DIAGNOSIS — G894 Chronic pain syndrome: Secondary | ICD-10-CM

## 2020-12-17 ENCOUNTER — Encounter
Payer: Medicaid Other | Attending: Physical Medicine and Rehabilitation | Admitting: Physical Medicine and Rehabilitation

## 2020-12-17 DIAGNOSIS — G8929 Other chronic pain: Secondary | ICD-10-CM | POA: Insufficient documentation

## 2020-12-17 DIAGNOSIS — G43519 Persistent migraine aura without cerebral infarction, intractable, without status migrainosus: Secondary | ICD-10-CM | POA: Insufficient documentation

## 2020-12-17 DIAGNOSIS — M797 Fibromyalgia: Secondary | ICD-10-CM | POA: Insufficient documentation

## 2020-12-17 DIAGNOSIS — M357 Hypermobility syndrome: Secondary | ICD-10-CM | POA: Insufficient documentation

## 2020-12-17 DIAGNOSIS — M5442 Lumbago with sciatica, left side: Secondary | ICD-10-CM | POA: Insufficient documentation

## 2020-12-17 DIAGNOSIS — M5441 Lumbago with sciatica, right side: Secondary | ICD-10-CM | POA: Insufficient documentation

## 2020-12-17 DIAGNOSIS — M7918 Myalgia, other site: Secondary | ICD-10-CM | POA: Insufficient documentation

## 2020-12-29 ENCOUNTER — Other Ambulatory Visit: Payer: Self-pay | Admitting: Physical Medicine and Rehabilitation

## 2020-12-29 ENCOUNTER — Other Ambulatory Visit: Payer: Self-pay | Admitting: Family Medicine

## 2021-02-25 ENCOUNTER — Other Ambulatory Visit: Payer: Self-pay | Admitting: Physical Medicine and Rehabilitation

## 2021-02-26 ENCOUNTER — Telehealth: Payer: Self-pay

## 2021-02-26 ENCOUNTER — Other Ambulatory Visit: Payer: Self-pay | Admitting: Family Medicine

## 2021-02-26 ENCOUNTER — Ambulatory Visit (INDEPENDENT_AMBULATORY_CARE_PROVIDER_SITE_OTHER): Payer: Medicaid Other | Admitting: Family Medicine

## 2021-02-26 ENCOUNTER — Other Ambulatory Visit: Payer: Self-pay

## 2021-02-26 DIAGNOSIS — G43719 Chronic migraine without aura, intractable, without status migrainosus: Secondary | ICD-10-CM | POA: Diagnosis present

## 2021-02-26 DIAGNOSIS — F331 Major depressive disorder, recurrent, moderate: Secondary | ICD-10-CM | POA: Diagnosis not present

## 2021-02-26 MED ORDER — SUMATRIPTAN-NAPROXEN SODIUM 85-500 MG PO TABS: 1.0000 | ORAL_TABLET | ORAL | 0 refills | Status: DC | PRN

## 2021-02-26 MED ORDER — SUMATRIPTAN SUCCINATE 100 MG PO TABS: 100.0000 mg | ORAL_TABLET | ORAL | 0 refills | Status: DC | PRN

## 2021-02-26 NOTE — Assessment & Plan Note (Signed)
PHQ-9 18 today.  No active or passive SI.  Patient is on venlafaxine and amitriptyline which is prescribed by her pain clinic.  Her depression, migraines and fibromyalgia are likely inter-linked and likely exacerbating each other. Would recommend adequate management of each of these conditions which I explained to the patient.  Patient is currently not in therapy.  I encouraged her to find a therapist and provided her with therapy resources.

## 2021-02-26 NOTE — Patient Instructions (Addendum)
Thank you for coming to see me today. It was a pleasure. Today we discussed your migraines. I recommend Treximet within 1 hour of the migraine attack. Do not take more than 2 tablets in the day.  If your headache worsens then go to the ER.   Please follow-up with PCP as and when needed.   If you have any questions or concerns, please do not hesitate to call the office at [phone].  Best wishes,   Dr Allena Katz    Migraine Headache A migraine headache is a very strong throbbing pain on one side or both sides of your head. This type of headache can also cause other symptoms. It can last from 4 hours to 3 days. Talk with your doctor about what things may bring on (trigger) this condition. What are the causes? The exact cause of this condition is not known. This condition may be triggered or caused by:  Drinking alcohol.  Smoking.  Taking medicines, such as: ? Medicine used to treat chest pain (nitroglycerin). ? Birth control pills. ? Estrogen. ? Some blood pressure medicines.  Eating or drinking certain products.  Doing physical activity. Other things that may trigger a migraine headache include:  Having a menstrual period.  Pregnancy.  Hunger.  Stress.  Not getting enough sleep or getting too much sleep.  Weather changes.  Tiredness (fatigue). What increases the risk?  Being 82-18 years old.  Being female.  Having a family history of migraine headaches.  Being Caucasian.  Having depression or anxiety.  Being very overweight. What are the signs or symptoms?  A throbbing pain. This pain may: ? Happen in any area of the head, such as on one side or both sides. ? Make it hard to do daily activities. ? Get worse with physical activity. ? Get worse around bright lights or loud noises.  Other symptoms may include: ? Feeling sick to your stomach (nauseous). ? Vomiting. ? Dizziness. ? Being sensitive to bright lights, loud noises, or smells.  Before you  get a migraine headache, you may get warning signs (an aura). An aura may include: ? Seeing flashing lights or having blind spots. ? Seeing bright spots, halos, or zigzag lines. ? Having tunnel vision or blurred vision. ? Having numbness or a tingling feeling. ? Having trouble talking. ? Having weak muscles.  Some people have symptoms after a migraine headache (postdromal phase), such as: ? Tiredness. ? Trouble thinking (concentrating). How is this treated?  Taking medicines that: ? Relieve pain. ? Relieve the feeling of being sick to your stomach. ? Prevent migraine headaches.  Treatment may also include: ? Having acupuncture. ? Avoiding foods that bring on migraine headaches. ? Learning ways to control your body functions (biofeedback). ? Therapy to help you know and deal with negative thoughts (cognitive behavioral therapy). Follow these instructions at home: Medicines  Take over-the-counter and prescription medicines only as told by your doctor.  Ask your doctor if the medicine prescribed to you: ? Requires you to avoid driving or using heavy machinery. ? Can cause trouble pooping (constipation). You may need to take these steps to prevent or treat trouble pooping:  Drink enough fluid to keep your pee (urine) pale yellow.  Take over-the-counter or prescription medicines.  Eat foods that are high in fiber. These include beans, whole grains, and fresh fruits and vegetables.  Limit foods that are high in fat and sugar. These include fried or sweet foods. Lifestyle  Do not drink alcohol.  Do  not use any products that contain nicotine or tobacco, such as cigarettes, e-cigarettes, and chewing tobacco. If you need help quitting, ask your doctor.  Get at least 8 hours of sleep every night.  Limit and deal with stress. General instructions  Keep a journal to find out what may bring on your migraine headaches. For example, write down: ? What you eat and drink. ? How much  sleep you get. ? Any change in what you eat or drink. ? Any change in your medicines.  If you have a migraine headache: ? Avoid things that make your symptoms worse, such as bright lights. ? It may help to lie down in a dark, quiet room. ? Do not drive or use heavy machinery. ? Ask your doctor what activities are safe for you.  Keep all follow-up visits as told by your doctor. This is important.      Contact a doctor if:  You get a migraine headache that is different or worse than others you have had.  You have more than 15 headache days in one month. Get help right away if:  Your migraine headache gets very bad.  Your migraine headache lasts longer than 72 hours.  You have a fever.  You have a stiff neck.  You have trouble seeing.  Your muscles feel weak or like you cannot control them.  You start to lose your balance a lot.  You start to have trouble walking.  You pass out (faint).  You have a seizure. Summary  A migraine headache is a very strong throbbing pain on one side or both sides of your head. These headaches can also cause other symptoms.  This condition may be treated with medicines and changes to your lifestyle.  Keep a journal to find out what may bring on your migraine headaches.  Contact a doctor if you get a migraine headache that is different or worse than others you have had.  Contact your doctor if you have more than 15 headache days in a month. This information is not intended to replace advice given to you by your health care provider. Make sure you discuss any questions you have with your health care provider. Document Revised: 12/31/2018 Document Reviewed: 10/21/2018 Elsevier Patient Education  2021 [employer].   Outpatient Mental Health Providers (No Insurance required or Self Pay)  [facility]  [phone] [email or web address] Marcy Panning and [facility]  [address] [place], [place] [phone] Crisis [phone]  [facility]) can see uninsured folks for outpatient therapy [email or web address] [address] [place], [place] [address] [phone]  [facility]    Walk-in Mon-Fri, 8am-3pm [email or web address] Scull [address], [place], [place]  [phone]   [address]  [place] [phone] [facility] for psych med management, there may be a wait- if [facility] is working with clients for OPT, they will coordinate with [facility] for psych  [facility]   Walk-in-Clinic: Monday- Friday 9:00 AM - 4:00 PM [address]   [place], [place] [phone]  [facility] [place] ([employer]) walk in M-F 8am-12pm and  1pm-3pm [place]- [address]     [phone]  [employer] [address]  Phone: [phone]  [employer] (Mental Health and substance challenges) [address]   [place] [phone]    [email or web address]    [address]  [employer]  [employer] [facility]  [facility] -[address], [place]     Phone:  [phone] [place]-  [address]  [phone]   [facility]  [address] [place]  [phone] [email or web address]   [facility] ( virtual or zoom therapy) [email or web address]  [address] [place]  [phone]    [facility] [phone]  grief counseling, dementia and caregiver support    [facility] Walk-in MWF 12:30 to 3:00     [address] [place] [address]  [phone]  [email or web address] call to schedule an appointment     National Alliance on Mental Illness (NAMI) [place]- Wellness classes, Support groups        [address] [place], [place] [address] [phone]   [email or web address]   [facility]  (Psycho-social  Rehabilitation clubhouse, Individual and group therapy) [address] [place], [place] [address]   [phone]  24- Hour Availability:  [facility] [phone] or [phone] * [facility] [employer] (Domestic Violence, Rape, etc. )[phone] Vesta Mixer [phone] or [phone] * [facility] [phone] only) [phone] (after hours) *[facility] [phone] *[place] National Suicide Hotline [phone] Len Childs)

## 2021-02-26 NOTE — Telephone Encounter (Signed)
Called patient. No answer. Left HIPAA compliant VM for patient to return call to office for update.   Veronda Prude, RN

## 2021-02-26 NOTE — Telephone Encounter (Signed)
Patient calls nurse line regarding rx for sumatriptan- naproxen. Patient reports that she cannot afford medication. Please see below for Medicaid preferred migraine medications.     Please advise if alternative can be sent in.   Veronda Prude, RN

## 2021-02-26 NOTE — Assessment & Plan Note (Addendum)
Acute migraine in clinic today.  Not responding to Tylenol, aspirin, Motrin and naltrexone which patient has tried.  Normal cranial nerve exam, normal visual acuity.  Started patient on sumatriptan- naproxen combination drug.  Recommended follow-up with PCP and pain clinic if persistent symptoms.  Could consider referral to migraine/headache clinic if no improvement in symptoms. Also recommended patient eat 3 regular meals a day, keeps adequately hydrated, avoid triggers and gets a good amount of sleep to help with migraine prophylaxis.  ER precautions provided to patient.

## 2021-02-26 NOTE — Telephone Encounter (Signed)
Prescribed Imitrex, 20 tablets. Please could you kindly inform pt? Thank you!

## 2021-02-26 NOTE — Progress Notes (Signed)
     SUBJECTIVE:   CHIEF COMPLAINT / HPI:    Marissa Contreras is a 30 y.o. female presents for acute migraine  Husband was present at time of visit  Migraines Pt reports an acute migraine right now.  Pain is described as a throbbing, bilateral retro-orbital pain radiating posteriorly.  Pain is associated with neck stiffness, photophobia and phonophobia.  She has suffered from migraines for the last 2 years. She gets them every day and they last all day. They ease off but do not stop completely.  She has tried tylenol, ASA and motrin but has not helped.  She took a unintentional overdose last year of Tylenol to help with the migraines and ended up in hospital.  She has used naltrexone which was prescribed for fibromyalgia in the past which no longer helps. Triggers: loud noises/high-pitched noises.   Depression Flowsheet Row Office Visit from 02/26/2021 in [facility]  PHQ-9 Total Score 18    Currently been seen by a pain clinic who are managing her depression.  Denies active or passive SI.  She takes venlafaxine and amitriptyline.  PERTINENT  PMH / PSH: Depression, fibromyalgia, migraine  OBJECTIVE:   BP 112/84   Pulse 91   Ht 5\' 6"  (1.676 m)   Wt 181 lb 9.6 oz (82.4 kg)   LMP 02/21/2021 (Approximate)   SpO2 95%   BMI 29.31 kg/m    General: Alert, in acute distress, in dimly lit room Cardio: Well-perfused Pulm: normal work of breathing Neuro: Cranial nerve exam normal, visual acuity normal  ASSESSMENT/PLAN:   Migraine Acute migraine in clinic today.  Not responding to Tylenol, aspirin, Motrin and naltrexone which patient has tried.  Normal cranial nerve exam, normal visual acuity.  Started patient on sumatriptan- naproxen combination drug.  Recommended follow-up with PCP and pain clinic if persistent symptoms.  Also recommended patient eat 3 regular meals a day, keeps adequately hydrated, avoid triggers and gets a good amount of sleep to help with migraine  prophylaxis.  ER precautions provided to patient.  Depression PHQ-9 18 today.  No active or passive SI.  Patient is on venlafaxine and amitriptyline which is prescribed by her pain clinic.  Her depression, migraines and fibromyalgia are likely inter-linked and likely exacerbating each other. Would recommend adequate management of each of these conditions which I explained to the patient.  Patient is currently not in therapy.  I encouraged her to find a therapist and provided her with therapy resources.      04/23/2021, MD PGY-2 [facility]

## 2021-03-06 ENCOUNTER — Encounter: Payer: Self-pay | Admitting: Family Medicine

## 2021-03-17 ENCOUNTER — Other Ambulatory Visit: Payer: Self-pay | Admitting: Family Medicine

## 2021-03-21 ENCOUNTER — Other Ambulatory Visit: Payer: Self-pay

## 2021-03-21 ENCOUNTER — Ambulatory Visit (INDEPENDENT_AMBULATORY_CARE_PROVIDER_SITE_OTHER): Payer: Medicaid Other | Admitting: Family Medicine

## 2021-03-21 VITALS — BP 141/80 | HR 136 | Ht 66.0 in | Wt 184.4 lb

## 2021-03-21 DIAGNOSIS — G43719 Chronic migraine without aura, intractable, without status migrainosus: Secondary | ICD-10-CM | POA: Diagnosis present

## 2021-03-21 MED ORDER — KETOROLAC TROMETHAMINE 60 MG/2ML IM SOLN
60.0000 mg | Freq: Once | INTRAMUSCULAR | Status: AC
  Administered 2021-03-21: 60 mg via INTRAMUSCULAR

## 2021-03-21 MED ORDER — DIPHENHYDRAMINE HCL 25 MG PO CAPS
50.0000 mg | ORAL_CAPSULE | Freq: Once | ORAL | Status: AC
  Administered 2021-03-21: 50 mg via ORAL

## 2021-03-21 NOTE — Assessment & Plan Note (Signed)
Chronic migraine.  We will treat today's headache with Benadryl and Toradol injection.  Cautioned not to drive or use heavy machinery.  She has someone to take her home.  Urged her to make a PCP appointment in the next 2 weeks and I have put in a referral to chronic headache clinic.

## 2021-03-21 NOTE — Progress Notes (Signed)
    CHIEF COMPLAINT / HPI: Migraine headache, chronic Patient reports several year history of daily chronic migraines.  Has previously been treated intermittently at a pain management clinic.  Has never had successful resolution of headache syndrome. Headache today is global, associated with photophobia, mild nausea.  This is her typical presentation.  She does not have true typical aura.  She also has some mild nausea.  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  Enjoys third shift.  She has difficulty with sleeping.  Reports she sleeps only a couple of hours at a time and this has been ongoing long-term.  At 1 point she was treated with some naltrexone for her headache syndrome and that initially worked but then it tapered off.  She also been treated with sumatriptan and was taking it daily until insurance refused to fill it so she is not had any in several weeks.   PERTINENT  PMH / PSH: I have reviewed the patient's medications, allergies, past medical and surgical history, smoking status and updated in the EMR as appropriate.   OBJECTIVE:  BP (!) 141/80   Pulse (!) 136   Ht 5\' 6"  (1.676 m)   Wt 184 lb 6.4 oz (83.6 kg)   LMP 02/21/2021 (Approximate)   SpO2 98%   BMI 29.76 kg/m  GENERAL: Well-developed female no acute distress HEENT: Pupils equal round reactive to light extraocular muscles are intact.  Sclera is nonicteric.  Discs look flat Tympanic membranes bilaterally with good cone of light and normal landmarks.  Neck is supple. PSYCH: AxOx4. Good eye contact.. No psychomotor retardation or agitation. Appropriate speech fluency and content. Asks and answers questions appropriately. Mood is congruent.   ASSESSMENT / PLAN:   Migraine Chronic migraine.  We will treat today's headache with Benadryl and Toradol injection.  Cautioned not to drive or use heavy machinery.  She has someone to take her home.  Urged her to make a PCP appointment in the next 2 weeks and  I have put in a referral to chronic headache clinic.   04/23/2021 MD

## 2021-03-21 NOTE — Patient Instructions (Signed)
I am going to put a referral in for the headache clinic.  I would like you to make an appointment with your new PCP, Dr. Idalia Needle, and see her in the next 2 weeks or so.  I do think you are going to need some regular follow-up here.  Today I have given you headache cocktail that will hopefully ease some of your pain.  I am going to defer refilling your sumatriptan hand because I think it is probably not helping you right now.  Hopefully we can get you into the headache clinic and in with Dr. Shanon Rosser in the next couple of weeks.

## 2021-03-28 ENCOUNTER — Ambulatory Visit: Payer: Medicaid Other | Admitting: Student

## 2021-03-28 NOTE — Progress Notes (Deleted)
    SUBJECTIVE:   CHIEF COMPLAINT / HPI:   Breast mass  PERTINENT  PMH / PSH: ***  OBJECTIVE:   There were no vitals taken for this visit.  ***  ASSESSMENT/PLAN:   No problem-specific Assessment & Plan notes found for this encounter.     Levin Erp, MD [facility]   {    This will disappear when note is signed, click to select method of visit    :1}

## 2021-04-03 ENCOUNTER — Ambulatory Visit: Payer: Medicaid Other | Admitting: Student

## 2021-04-03 NOTE — Progress Notes (Deleted)
Subjective:     Marissa Contreras is an 30 y.o. female who presents for evaluation of a breast mass. Change was noted {1-10:13787} {time; units:19136} ago, and has been {progression:18636} since first identified. Patient {does/does not:19097} routinely do self breast exams. The mass {does/does not:19097} change during menstrual cycle. The mass {is/is not:9024} tender. Patient {denies/admits to:19208} nipple discharge. Breast cancer risk factors include: {breast ca risks:12807}.  ***  Review of Systems {ros - complete:19594}     Objective:    General appearance: {general exam:16600} Lungs: {lung exam:16931} Breasts: {breast exam:13139} Heart: {heart exam:5510} Abdomen: {abdominal exam:16834} Pelvic: {pelvic exam:16852} Skin: {skin exam:31329} Lymph nodes: {lymph node exam:14039} ***    Assessment:    {BREAST:18177}    Plan:    {Plan; breast lump:[phone]}

## 2021-04-09 ENCOUNTER — Telehealth: Payer: Self-pay

## 2021-04-09 NOTE — Telephone Encounter (Signed)
Patient LVM on nurse line requesting an urgent refill on Effexor. I do not see this on her current medication list. Patient reports she is out as of tomorrow. Will forward to PCP for advisement.

## 2021-04-12 ENCOUNTER — Other Ambulatory Visit: Payer: Self-pay | Admitting: Family Medicine

## 2021-04-12 NOTE — Telephone Encounter (Signed)
LVM for patient to call [facility] and schedule an appointment with PCP to discuss medications.  Advised patient that she should contact her pain management provider for RX for Effexor.  Glennie Hawk, [name]

## 2021-04-23 ENCOUNTER — Other Ambulatory Visit: Payer: Self-pay | Admitting: Physical Medicine and Rehabilitation

## 2021-05-09 ENCOUNTER — Other Ambulatory Visit: Payer: Self-pay | Admitting: Physical Medicine and Rehabilitation

## 2021-06-21 ENCOUNTER — Emergency Department (HOSPITAL_COMMUNITY)
Admission: EM | Admit: 2021-06-21 | Discharge: 2021-06-21 | Disposition: A | Discharge status: Home / Self Care | Payer: Medicaid Other | Attending: Emergency Medicine | Admitting: Emergency Medicine

## 2021-06-21 ENCOUNTER — Emergency Department (HOSPITAL_COMMUNITY): Payer: Medicaid Other

## 2021-06-21 ENCOUNTER — Encounter (HOSPITAL_COMMUNITY): Payer: Self-pay

## 2021-06-21 DIAGNOSIS — Z87891 Personal history of nicotine dependence: Secondary | ICD-10-CM | POA: Diagnosis not present

## 2021-06-21 DIAGNOSIS — J45909 Unspecified asthma, uncomplicated: Secondary | ICD-10-CM | POA: Diagnosis not present

## 2021-06-21 DIAGNOSIS — R1011 Right upper quadrant pain: Secondary | ICD-10-CM | POA: Insufficient documentation

## 2021-06-21 LAB — COMPREHENSIVE METABOLIC PANEL
ALT: 48 U/L — ABNORMAL HIGH (ref 0–44)
AST: 91 U/L — ABNORMAL HIGH (ref 15–41)
Albumin: 4.1 g/dL (ref 3.5–5.0)
Alkaline Phosphatase: 101 U/L (ref 38–126)
Anion gap: 8 (ref 5–15)
BUN: 11 mg/dL (ref 6–20)
CO2: 26 mmol/L (ref 22–32)
Calcium: 9 mg/dL (ref 8.9–10.3)
Chloride: 103 mmol/L (ref 98–111)
Creatinine, Ser: 0.65 mg/dL (ref 0.44–1.00)
GFR, Estimated: >60 mL/min (ref >60)
Glucose, Bld: 154 mg/dL — ABNORMAL HIGH (ref 70–99)
Potassium: 3.7 mmol/L (ref 3.5–5.1)
Sodium: 137 mmol/L (ref 135–145)
Total Bilirubin: 0.4 mg/dL (ref 0.3–1.2)
Total Protein: 7.2 g/dL (ref 6.5–8.1)

## 2021-06-21 LAB — URINALYSIS, ROUTINE W REFLEX MICROSCOPIC
Bacteria, UA: NONE SEEN
Bilirubin Urine: NEGATIVE
Glucose, UA: NEGATIVE mg/dL
Hgb urine dipstick: NEGATIVE
Ketones, ur: NEGATIVE mg/dL
Nitrite: NEGATIVE
Protein, ur: 30 mg/dL — AB
Specific Gravity, Urine: 1.033 — ABNORMAL HIGH (ref 1.005–1.030)
pH: 7 (ref 5.0–8.0)

## 2021-06-21 LAB — CBC
HCT: 45.6 % (ref 36.0–46.0)
Hemoglobin: 14.9 g/dL (ref 12.0–15.0)
MCH: 29.9 pg (ref 26.0–34.0)
MCHC: 32.7 g/dL (ref 30.0–36.0)
MCV: 91.4 fL (ref 80.0–100.0)
Platelets: 265 10*3/uL (ref 150–400)
RBC: 4.99 MIL/uL (ref 3.87–5.11)
RDW: 12.7 % (ref 11.5–15.5)
WBC: 9.3 10*3/uL (ref 4.0–10.5)
nRBC: 0 % (ref 0.0–0.2)

## 2021-06-21 LAB — LIPASE, BLOOD: Lipase: 33 U/L (ref 11–51)

## 2021-06-21 LAB — POC URINE PREG, ED: Preg Test, Ur: NEGATIVE

## 2021-06-21 MED ORDER — ONDANSETRON HCL 4 MG/2ML IJ SOLN
4.0000 mg | Freq: Once | INTRAMUSCULAR | Status: AC
  Administered 2021-06-21: 4 mg via INTRAVENOUS
  Filled 2021-06-21: qty 2

## 2021-06-21 MED ORDER — IOHEXOL 300 MG/ML  SOLN
100.0000 mL | Freq: Once | INTRAMUSCULAR | Status: AC | PRN
  Administered 2021-06-21: 100 mL via INTRAVENOUS

## 2021-06-21 MED ORDER — MORPHINE SULFATE (PF) 4 MG/ML IV SOLN
2.0000 mg | Freq: Once | INTRAVENOUS | Status: AC
  Administered 2021-06-21: 2 mg via INTRAVENOUS
  Filled 2021-06-21: qty 1

## 2021-06-21 MED ORDER — MORPHINE SULFATE (PF) 4 MG/ML IV SOLN
4.0000 mg | Freq: Once | INTRAVENOUS | Status: AC
  Administered 2021-06-21: 4 mg via INTRAVENOUS
  Filled 2021-06-21: qty 1

## 2021-06-21 MED ORDER — SODIUM CHLORIDE 0.9 % IV BOLUS
1000.0000 mL | Freq: Once | INTRAVENOUS | Status: AC
  Administered 2021-06-21: 1000 mL via INTRAVENOUS

## 2021-06-21 NOTE — ED Provider Notes (Signed)
[facility] EMERGENCY DEPARTMENT Provider Note   CSN: [id number] Arrival date & time: 06/21/21  1437     History Chief Complaint  Patient presents with   Abdominal Pain    Marissa Contreras is a 30 y.o. female with a past medical history of fibromyalgia, myofascial pain disorder, migraine disorder and tubal ligation presenting with a complaint of "liver attack."  Reports that last year in November she had a "liver attack" where her enzymes were out of whack. This morning she began to have right upper quadrant pain that radiated into her mid sternum.  Continues with this pain.  10 out of 10.  Patient with history of cholecystectomy in 2020.  No recent illness, vomiting, constipation or diarrhea.   Past Medical History:  Diagnosis Date   Anxiety    Asthma    Breastfeeding (infant)    Chronic back pain    Depression    Fibromyalgia    H/O varicella    Headache    Menorrhagia 08/19/2016   Neck pain 09/28/2018   Positive GBS test 04/18/2018   Right hip pain 04/20/2019   UTI (urinary tract infection) 09/09/2017   Vaginal delivery 05/09/2018   Please schedule this patient for Postpartum visit in: 4 weeks with the following provider: Any provider For C/S patients schedule nurse incision check in weeks 2 weeks: n/a Low risk pregnancy complicated by: none Delivery mode:  SVD Anticipated Birth Control:  BTL, planned for this hospital stay  PP Procedures needed: none  Schedule Integrated BH visit: no     Patient Active Problem List   Diagnosis Date Noted   Irregular periods 09/02/2020   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   Fibromyalgia 01/27/2020   Myofascial pain 01/27/2020   Fatigue 09/26/2019   Cholelithiasis 06/08/2019   Migraine 06/03/2017   Mild intermittent asthma 12/20/2014   Bilateral hip pain 12/08/2014   Depression 12/20/2013   Low back pain 02/11/2012    Past Surgical History:  Procedure Laterality Date   CHOLECYSTECTOMY     DILATION AND CURETTAGE OF UTERUS     DILATION AND  EVACUATION N/A 03/08/2014   Procedure: DILATATION AND EVACUATION ;  Surgeon: Purcell Nails, MD;  Location: [facility];  Service: Gynecology;  Laterality: N/A;  with chromosomal studies, sent    TONSILLECTOMY     TUBAL LIGATION Bilateral 05/10/2018   Procedure: POST PARTUM TUBAL LIGATION;  Surgeon: Adam Phenix, MD;  Location: [facility] SU[facility];  Service: Gynecology;  Laterality: Bilateral;     OB History     Gravida  7   Para  3   Term  3   Preterm      AB  4   Living  3      SAB  4   IAB      Ectopic      Multiple  0   Live Births  3           Family History  Problem Relation Age of Onset   Hypertension Father    Diabetes Father    Fibromyalgia Mother    Arthritis Mother    Depression Mother    Diabetes Paternal Grandmother    Cancer Paternal Grandmother    Stroke Paternal Grandmother     Social History   Tobacco Use   Smoking status: Former    Packs/day: 1.00    Types: Cigarettes    Quit date: 09/24/2017    Years since quitting: 3.7   Smokeless tobacco: Never  Tobacco comments:    quit for pregnancy  Vaping Use   Vaping Use: Former  Substance Use Topics   Alcohol use: No   Drug use: No    Home Medications Prior to Admission medications   Medication Sig Start Date End Date Taking? Authorizing Provider  albuterol ([name] 108 (90 Base) MCG/ACT inhaler Inhale 2 puffs into the lungs every 6 (six) hours as needed for wheezing or shortness of breath. 09/27/20   Allayne Stack, DO  amitriptyline (ELAVIL) 25 MG tablet TAKE 1-2 TABLETS (25-50 MG TOTAL) BY MOUTH AT BEDTIME. FOR SLEEP 09/28/20   Lovorn, Aundra Millet, MD  DULoxetine (CYMBALTA) 30 MG capsule TAKE 1 CAPSULE BY MOUTH 2 TIMES DAILY. 05/13/21   Lovorn, Aundra Millet, MD  gabapentin (NEURONTIN) 600 MG tablet TAKE 1 TABLET(600MG ) 3 TIMES A DAY AND 2 TABLETS(1200MG ) AT BEDTIME 02/25/21   Lovorn, Megan, MD  SUMAtriptan (IMITREX) 100 MG tablet TAKE 1 TAB BY MOUTH EVERY 2 HOURS AS NEEDED FOR MIGRAINE. MAY  REPEAT IN 2 HOURS IF HEADACHE PERSISTS 04/19/21   Cora Collum, DO    Allergies    Patient has no known allergies.  Review of Systems   Review of Systems  Constitutional:  Negative for chills and fever.  HENT:  Negative for sore throat.   Cardiovascular:  Positive for chest pain.  Gastrointestinal:  Positive for abdominal pain and nausea. Negative for constipation, diarrhea and vomiting.  Genitourinary:  Negative for dysuria, hematuria, vaginal bleeding and vaginal discharge.  Skin:  Negative for rash.  Neurological:  Negative for headaches.  All other systems reviewed and are negative.  Physical Exam Updated Vital Signs BP 122/80 (BP Location: Right Arm)   Pulse (!) 111   Temp 97.7 F (36.5 C) (Oral)   Resp 17   Ht 5\' 6"  (1.676 m)   Wt 83 kg   LMP 05/24/2021 (Approximate)   SpO2 99%   BMI 29.54 kg/m   Physical Exam Vitals and nursing note reviewed.  Constitutional:      Appearance: Normal appearance.  HENT:     Head: Normocephalic and atraumatic.     Mouth/Throat:     Mouth: Mucous membranes are moist.     Pharynx: Oropharynx is clear.  Eyes:     General: No scleral icterus.    Conjunctiva/sclera: Conjunctivae normal.  Pulmonary:     Effort: Pulmonary effort is normal. No respiratory distress.  Abdominal:     General: Abdomen is flat. There is no distension.     Palpations: Abdomen is soft. There is no hepatomegaly.     Tenderness: There is abdominal tenderness in the right upper quadrant.     Hernia: No hernia is present.  Skin:    General: Skin is warm and dry.     Findings: No rash.  Neurological:     Mental Status: She is alert.  Psychiatric:        Mood and Affect: Mood is anxious.    ED Results / Procedures / Treatments   Labs (all labs ordered are listed, but only abnormal results are displayed) Labs Reviewed  COMPREHENSIVE METABOLIC PANEL - Abnormal; Notable for the following components:      Result Value   Glucose, Bld 154 (*)    AST 91  (*)    ALT 48 (*)    All other components within normal limits  URINALYSIS, ROUTINE W REFLEX MICROSCOPIC - Abnormal; Notable for the following components:   APPearance CLOUDY (*)    Specific Gravity,  Urine 1.033 (*)    Protein, ur 30 (*)    Leukocytes,Ua TRACE (*)    All other components within normal limits  LIPASE, BLOOD  CBC  POC URINE PREG, ED    EKG None  Radiology CT ABDOMEN PELVIS W CONTRAST  Result Date: 06/21/2021 CLINICAL DATA:  Acute abdominal pain. Elevated liver enzymes. Prior cholecystectomy. EXAM: CT ABDOMEN AND PELVIS WITH CONTRAST TECHNIQUE: Multidetector CT imaging of the abdomen and pelvis was performed using the standard protocol following bolus administration of intravenous contrast. CONTRAST:  OMNIPAQUE IOHEXOL 300 MG/ML  SOLN COMPARISON:  05/26/2019 FINDINGS: Lower Chest: No acute findings. Hepatobiliary: No hepatic masses identified. Prior cholecystectomy. No evidence of biliary obstruction. Pancreas:  No mass or inflammatory changes. Spleen: Within normal limits in size and appearance. Adrenals/Urinary Tract: No masses identified. No evidence of ureteral calculi or hydronephrosis. Stomach/Bowel: No evidence of obstruction, inflammatory process or abnormal fluid collections. Normal appendix visualized. Vascular/Lymphatic: No pathologically enlarged lymph nodes. No acute vascular findings. Reproductive: No masses identified. Tiny amount of free fluid in pelvic cul-de-sac, most likely physiologic in a reproductive age female. Other:  None. Musculoskeletal:  No suspicious bone lesions identified. IMPRESSION: Tiny amount of free fluid in pelvic cul-de-sac, most likely physiologic in a reproductive age female. Otherwise unremarkable exam. Electronically Signed   By: Danae Orleans M.D.   On: 06/21/2021 17:03    Procedures Procedures   Medications Ordered in ED Medications  sodium chloride 0.9 % bolus 1,000 mL (1,000 mLs Intravenous New Bag/Given 06/21/21 1525)   morphine 4 MG/ML injection 4 mg (4 mg Intravenous Given 06/21/21 1525)  ondansetron (ZOFRAN) injection 4 mg (4 mg Intravenous Given 06/21/21 1525)    ED Course  I have reviewed the triage vital signs and the nursing notes.  Pertinent labs & imaging results that were available during my care of the patient were reviewed by me and considered in my medical decision making (see chart for details).    MDM Rules/Calculators/A&P Austine Kelsay is a 30 y.o. female with a past medical history of fibromyalgia, myofascial pain disorder, migraine disorder and tubal ligation presenting with a complaint of "liver attack."  Reports that last year in November she had a "liver attack" where her enzymes were out of whack. This morning she began to have right upper quadrant pain that radiated into her mid sternum.  Continues with this pain.  10 out of 10.  Patient with history of cholecystectomy in 2020.  No recent illness, vomiting, constipation or diarrhea.  Patient was evaluated by me at bedside.  She was noted to be in pain that she rated a 10 out of 10.  I pursued a normal abdominal pain work-up.  Blood work showed a slight increase in liver enzymes.  CT revealed no abnormalities.  Liver normal appearing.  Patient reported feeling better after fluids and medication.  Reported that she takes 3 g of Tylenol every other day.  I suspect this may be responsible for her elevated liver enzymes.  I am unsure about her abdominal pain however at this time she is reporting feeling better.  Agreeable and stable for discharge home.  Denied the need for Zofran stating that she has antinausea medication at home.  Will follow up with primary care provider about liver enzymes and these repeat symptoms as soon as possible. Final Clinical Impression(s) / ED Diagnoses Final diagnoses:  Right upper quadrant abdominal pain    Rx / DC Orders Results and diagnoses were explained to the patient.  Return precautions discussed in full.  Patient had no additional questions and expressed complete understanding.     Woodroe Chen 06/21/21 Raj Janus, MD 06/24/21 [phone]

## 2021-06-21 NOTE — ED Notes (Signed)
Pt transported for CT 

## 2021-06-21 NOTE — ED Triage Notes (Signed)
Pt. States they are having "Liver pain". Pt. States the pain radiates to their chest. Pt. States last  year they had elevated liver enzymes and a "Liver attack."

## 2021-06-21 NOTE — Discharge Instructions (Signed)
Your work-up is reassuring today.  Your liver enzymes only slightly elevated, potentially due to Tylenol use.  Your CT of your abdomen did not show any cause for your pain.  I would like you to follow-up with a primary care provider about your symptoms for further management.  It was a pleasure to meet you and I hope that you feel better.

## 2021-06-27 ENCOUNTER — Ambulatory Visit: Payer: Medicaid Other | Admitting: Neurology

## 2021-06-27 ENCOUNTER — Encounter: Payer: Self-pay | Admitting: Neurology

## 2021-06-27 VITALS — BP 106/81 | HR 110 | Ht 66.0 in | Wt 189.6 lb

## 2021-06-27 DIAGNOSIS — G43711 Chronic migraine without aura, intractable, with status migrainosus: Secondary | ICD-10-CM

## 2021-06-27 DIAGNOSIS — G43009 Migraine without aura, not intractable, without status migrainosus: Secondary | ICD-10-CM | POA: Diagnosis not present

## 2021-06-27 MED ORDER — EMGALITY 120 MG/ML ~~LOC~~ SOAJ: 120.0000 mg | SUBCUTANEOUS | 11 refills | Status: DC

## 2021-06-27 MED ORDER — UBRELVY 100 MG PO TABS: 100.0000 mg | ORAL_TABLET | ORAL | 11 refills | Status: DC | PRN

## 2021-06-27 NOTE — Patient Instructions (Addendum)
Preventative: Emgality once monthly to reduce overall migraine burden Acute/emergent: Stop ibuprofen and other over the counter meds. Can take up to 10 Imitrex a month and can take up to 16 ubrelvy a month Ubrelvy: Please take one tablet at the onset of your headache. If it does not improve the symptoms please take one additional tablet. Do not take more then 2 tablets in 24hrs. Do not take use more then 2 to 3 times in a week.  Analgesic Rebound Headache An analgesic rebound headache, sometimes called a medication overuse headache or a drug-induced headache, is a secondary disorder that is caused by the overuse of pain medicine (analgesic) to treat the original (primary) headache. Any type of primary headache can return as a rebound headache if a person regularly takes analgesics. The types of primary headaches that are commonly associated with rebound headaches include: Migraines. Headaches that are caused by tense muscles in the head and neck area (tension headaches). Headaches that develop and happen again on one side of the head and around the eye (cluster headaches). If rebound headaches continue, they can become long-term, daily headaches. What are the causes? This condition may be caused by frequent use of: Over-the-counter medicines such as aspirin, ibuprofen, and acetaminophen. Sinus-relief medicines and medicines that contain caffeine. Narcotic pain medicines such as codeine and oxycodone. Some prescription migraine medicines. What are the signs or symptoms? The symptoms of a rebound headache are the same as the symptoms of the original headache. Some of the symptoms of specific types of headaches include: Migraine headache Pulsing or throbbing pain on one or both sides of the head. Severe pain that interferes with daily activities. Pain that gets worse with physical activity. Nausea, vomiting, or both. Pain and sensitivity with exposure to bright light, loud noises, or strong  smells. Visual changes. Numbness of one or both arms. Tension headache Pressure around the head. Dull, aching head pain. Pain felt over the front and sides of the head. Tenderness in the muscles of the head, neck, and shoulders. Cluster headache Severe pain that begins in or around one eye or temple. Droopy or swollen eyelid, or redness and tearing in the eye on the same side as the pain. One-sided head pain. Nausea. Runny nose. Sweaty, pale facial skin. Restlessness. How is this diagnosed? This condition is diagnosed by: Reviewing your medical history. This includes the nature of your primary headaches. Reviewing the types of pain medicines that you have been using to treat your primary headaches and how often you take them. How is this treated? This condition may be treated or managed by: Discontinuing frequent use of the analgesic medicine. Doing this may worsen your headaches at first, but the pain should eventually become more manageable, less frequent, and less severe. Seeing a headache specialist. He or she may be able to help you manage your headaches and help make sure there is not another cause of the headaches. Using methods of stress relief, such as acupuncture, counseling, biofeedback, and massage. Talk with your health care provider about which methods might be good for you. Follow these instructions at home: Medicines  Take over-the-counter and prescription medicines only as told by your health care provider. Stop the repeated use of pain medicine as told by your health care provider. Stopping can be difficult. Carefully follow instructions from your health care provider. Lifestyle  Follow a regular sleep schedule. Do not vary the time that you go to bed or the amount that you sleep from day to day.  It is important to stay on the same schedule to help prevent headaches. Get 7-9 hours of sleep each night, or the amount recommended by your health care provider. Exercise  regularly. Exercise for at least 30 minutes, 5 times each week. Limit or manage stress. Consider stress-relief options such as acupuncture, counseling, biofeedback, and massage. Talk with your health care provider about which methods might be good for you. Do not drink alcohol. Do not use any products that contain nicotine or tobacco, such as cigarettes, e-cigarettes, and chewing tobacco. If you need help quitting, ask your health care provider. General instructions Avoid triggers that are known to cause your primary headaches. Keep all follow-up visits as told by your health care provider. This is important. Contact a health care provider if: You continue to experience headaches after following treatments that your health care provider recommended. Get help right away if you have: New headache pain. Headache pain that is different than what you have experienced in the past. Numbness or tingling in your arms or legs. Changes in your speech or vision. Summary An analgesic rebound headache, sometimes called a medication overuse headache or a drug-induced headache, is caused by the overuse of pain medicine (analgesic) to treat the original (primary) headache. Any type of primary headache can return as a rebound headache if a person regularly takes analgesics. The types of primary headaches that are commonly associated with rebound headaches include migraines, tension headaches, and cluster headaches. Analgesic rebound headaches can occur with frequent use of over-the-counter medicines and prescription medicines. Treatment involves stopping the medicine that is being overused. This will improve headache frequency and severity. This information is not intended to replace advice given to you by your health care provider. Make sure you discuss any questions you have with your health care provider. Document Revised: 10/06/2019 Document Reviewed: 10/06/2019 Elsevier Patient Education  2022 Elsevier  Inc.   Arco injection What is this medication? GALCANEZUMAB (gal ka NEZ ue mab) is used to prevent migraines and treat cluster headaches. This medicine may be used for other purposes; ask your health care provider or pharmacist if you have questions. COMMON BRAND NAME(S): Emgality What should I tell my care team before I take this medication? They need to know if you have any of these conditions: an unusual or allergic reaction to galcanezumab, other medicines, foods, dyes, or preservatives pregnant or trying to get pregnant breast-feeding How should I use this medication? This medicine is for injection under the skin. You will be taught how to prepare and give this medicine. Use exactly as directed. Take your medicine at regular intervals. Do not take your medicine more often than directed. It is important that you put your used needles and syringes in a special sharps container. Do not put them in a trash can. If you do not have a sharps container, call your pharmacist or healthcare provider to get one. Talk to your pediatrician regarding the use of this medicine in children. Special care may be needed. Overdosage: If you think you have taken too much of this medicine contact a poison control center or emergency room at once. NOTE: This medicine is only for you. Do not share this medicine with others. What if I miss a dose? If you miss a dose, take it as soon as you can. If it is almost time for your next dose, take only that dose. Do not take double or extra doses. What may interact with this medication? Interactions are not expected. This list  may not describe all possible interactions. Give your health care provider a list of all the medicines, herbs, non-prescription drugs, or dietary supplements you use. Also tell them if you smoke, drink alcohol, or use illegal drugs. Some items may interact with your medicine. What should I watch for while using this medication? Tell your  doctor or healthcare professional if your symptoms do not start to get better or if they get worse. What side effects may I notice from receiving this medication? Side effects that you should report to your doctor or health care professional as soon as possible: allergic reactions like skin rash, itching or hives, swelling of the face, lips, or tongue Side effects that usually do not require medical attention (report these to your doctor or health care professional if they continue or are bothersome): pain, redness, or irritation at site where injected This list may not describe all possible side effects. Call your doctor for medical advice about side effects. You may report side effects to FDA at 1-800-FDA-1088. Where should I keep my medication? Keep out of the reach of children. You will be instructed on how to store this medicine. Throw away any unused medicine after the expiration date on the label. NOTE: This sheet is a summary. It may not cover all possible information. If you have questions about this medicine, talk to your doctor, pharmacist, or health care provider.  2022 Elsevier/Gold Standard (2018-02-24 12:03:23) Ubrogepant tablets What is this medication? UBROGEPANT (ue BROE je pant) is used to treat migraine headaches with or without aura. An aura is a strange feeling or visual disturbance that warns you of an attack. It is not used to prevent migraines. This medicine may be used for other purposes; ask your health care provider or pharmacist if you have questions. COMMON BRAND NAME(S): [name] What should I tell my care team before I take this medication? They need to know if you have any of these conditions: kidney disease liver disease an unusual or allergic reaction to ubrogepant, other medicines, foods, dyes, or preservatives pregnant or trying to get pregnant breast-feeding How should I use this medication? Take this medicine by mouth with a glass of water. Follow the  directions on the prescription label. You can take it with or without food. If it upsets your stomach, take it with food. Take your medicine at regular intervals. Do not take it more often than directed. Do not stop taking except on your doctor's advice. Talk to your pediatrician about the use of this medicine in children. Special care may be needed. Overdosage: If you think you have taken too much of this medicine contact a poison control center or emergency room at once. NOTE: This medicine is only for you. Do not share this medicine with others. What if I miss a dose? This does not apply. This medicine is not for regular use. What may interact with this medication? Do not take this medicine with any of the following medicines: ceritinib certain antibiotics like chloramphenicol, clarithromycin, telithromycin certain antivirals for HIV like atazanavir, cobicistat, darunavir, delavirdine, fosamprenavir, indinavir, ritonavir certain medicines for fungal infections like itraconazole, ketoconazole, posaconazole, voriconazole conivaptan grapefruit idelalisib mifepristone nefazodone ribociclib This medicine may also interact with the following medications: carvedilol certain medicines for seizures like phenobarbital, phenytoin ciprofloxacin cyclosporine eltrombopag fluconazole fluvoxamine quinidine rifampin St. John's wort verapamil This list may not describe all possible interactions. Give your health care provider a list of all the medicines, herbs, non-prescription drugs, or dietary supplements you use. Also  tell them if you smoke, drink alcohol, or use illegal drugs. Some items may interact with your medicine. What should I watch for while using this medication? Visit your health care professional for regular checks on your progress. Tell your health care professional if your symptoms do not start to get better or if they get worse. Your mouth may get dry. Chewing sugarless gum or  sucking hard candy and drinking plenty of water may help. Contact your health care professional if the problem does not go away or is severe. What side effects may I notice from receiving this medication? Side effects that you should report to your doctor or health care professional as soon as possible: allergic reactions like skin rash, itching or hives; swelling of the face, lips, or tongue Side effects that usually do not require medical attention (report these to your doctor or health care professional if they continue or are bothersome): drowsiness dry mouth nausea tiredness This list may not describe all possible side effects. Call your doctor for medical advice about side effects. You may report side effects to FDA at 1-800-FDA-1088. Where should I keep my medication? Keep out of the reach of children. Store at room temperature between 15 and 30 degrees C (59 and 86 degrees F). Throw away any unused medicine after the expiration date. NOTE: This sheet is a summary. It may not cover all possible information. If you have questions about this medicine, talk to your doctor, pharmacist, or health care provider.  2022 Elsevier/Gold Standard (2018-11-25 08:50:55)

## 2021-06-27 NOTE — Progress Notes (Signed)
[facility]    Provider:  Dr Lucia Gaskins Requesting Provider: Cora Collum, DO Primary Care Provider:  Cora Collum, DO  CC:  Migraines  HPI:  Marissa Contreras is a 30 y.o. female here as requested by Cora Collum, DO for migraines.  I reviewed Dr. Caesar Bookman notes: Patient has chronic migraine headaches, she reports several year history of daily chronic migraines, has previously been treated intermittently at a pain management clinic, has never had successful resolution of headache syndrome, its global associated with photophobia, mild nausea as a typical presentation, she does not have aura she also endorses nausea.  At 1 point she was treated with naltrexone for her headache syndrome and that initially worked but then it tapered off.  She is also been treated with sumatriptan in the past.  Migraines started about 3 years ago after birth of child. Can be unilateral or whole head can hurt, she gets photo/phonophobia, dull, throbbing, sharp, pounding, nausea, usually last all day 24 hours, severe or moderately severe in pain, no vomiting, no vision changes, no numbness or tingling, not positional, anything can trigger it such as loud music, kid screaming or she can just get it, stress, not worse during period. Daily headaches and daily migraines and at least 4-5days a week is severe. No other focal neurologic deficits, associated symptoms, inciting events or modifiable factors. Husband is here and provides much information.  Reviewed notes, labs and imaging from outside physicians, which showed:  From a thorough review of records, medication tried that can be used in migraine management includes: Tylenol, amitriptyline, Stadol, Celexa, Flexeril, Decadron injections, Benadryl, Cymbalta, Prozac, gabapentin, ibuprofen, Toradol injections, Robaxin, Reglan oral and injections, naltrexone, naproxen, nifedipine (calcium channel blocker), Zofran, prednisone tablets, Phenergan,  Zoloft, sumatriptan, tizanidine, tramadol, Effexor, trazodone, amitriptyline, propranolol contraindicated because of hypotensive, topiramate, maxalt   CT 08/28/2020: showed No acute intracranial abnormalities including mass lesion or mass effect, hydrocephalus, extra-axial fluid collection, midline shift, hemorrhage, or acute infarction, large ischemic events (personally reviewed images)   Review of Systems: Patient complains of symptoms per HPI as well as the following symptoms headache. Pertinent negatives and positives per HPI. All others negative.   Social History   Socioeconomic History   Marital status: Married    Spouse name: tommy robinson   Number of children: 2   Years of education: 12   Highest education level: 12th grade  Occupational History   Not on file  Tobacco Use   Smoking status: Every Day    Packs/day: 0.50    Types: Cigarettes    Last attempt to quit: 09/24/2017    Years since quitting: 3.7   Smokeless tobacco: Never   Tobacco comments:    quit for pregnancy  Vaping Use   Vaping Use: Former  Substance and Sexual Activity   Alcohol use: No    Comment: occ   Drug use: No   Sexual activity: Yes    Birth control/protection: None  Other Topics Concern   Not on file  Social History Narrative   Lives with husband and mother in [place].  Husband is 6 years older, works at [employer].  No hobbies, described herself as a "homebody"  Online courses for high school due to difficulty with social stresses.     Social Determinants of Health   Financial Resource Strain: Not on file  Food Insecurity: Not on file  Transportation Needs: Not on file  Physical Activity: Not on file  Stress: Not on file  Social Connections: Not on file  Intimate Partner Violence: Not on file    Family History  Problem Relation Age of Onset   Fibromyalgia Mother    Arthritis Mother    Depression Mother    Migraines Mother    Hypertension Father    Diabetes Father    Migraines  Brother    Diabetes Paternal Grandmother    Cancer Paternal Grandmother    Stroke Paternal Grandmother     Past Medical History:  Diagnosis Date   Anxiety    Asthma    Breastfeeding (infant)    Chronic back pain    Depression    Fibromyalgia    H/O varicella    Headache    Menorrhagia 08/19/2016   Neck pain 09/28/2018   Positive GBS test 04/18/2018   Right hip pain 04/20/2019   UTI (urinary tract infection) 09/09/2017   Vaginal delivery 05/09/2018   Please schedule this patient for Postpartum visit in: 4 weeks with the following provider: Any provider For C/S patients schedule nurse incision check in weeks 2 weeks: n/a Low risk pregnancy complicated by: none Delivery mode:  SVD Anticipated Birth Control:  BTL, planned for this hospital stay  PP Procedures needed: none  Schedule Integrated BH visit: no     Patient Active Problem List   Diagnosis Date Noted   Chronic migraine without aura, with intractable migraine, so stated, with status migrainosus 06/30/2021   Irregular periods 09/02/2020   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   Fibromyalgia 01/27/2020   Myofascial pain 01/27/2020   Fatigue 09/26/2019   Cholelithiasis 06/08/2019   Migraine 06/03/2017   Mild intermittent asthma 12/20/2014   Bilateral hip pain 12/08/2014   Depression 12/20/2013   Low back pain 02/11/2012    Past Surgical History:  Procedure Laterality Date   CHOLECYSTECTOMY     DILATION AND CURETTAGE OF UTERUS     DILATION AND EVACUATION N/A 03/08/2014   Procedure: DILATATION AND EVACUATION ;  Surgeon: Purcell Nails, MD;  Location: [facility];  Service: Gynecology;  Laterality: N/A;  with chromosomal studies, sent    TONSILLECTOMY     TUBAL LIGATION Bilateral 05/10/2018   Procedure: POST PARTUM TUBAL LIGATION;  Surgeon: Adam Phenix, MD;  Location: [facility];  Service: Gynecology;  Laterality: Bilateral;    Current Outpatient Medications  Medication Sig Dispense Refill   albuterol (VENTOLIN  HFA) 108 (90 Base) MCG/ACT inhaler Inhale 2 puffs into the lungs every 6 (six) hours as needed for wheezing or shortness of breath. 1 each 2   DULoxetine (CYMBALTA) 30 MG capsule TAKE 1 CAPSULE BY MOUTH 2 TIMES DAILY. 180 capsule 1   gabapentin (NEURONTIN) 600 MG tablet TAKE 1 TABLET(600MG ) 3 TIMES A DAY AND 2 TABLETS(1200MG ) AT BEDTIME 150 tablet 5   Galcanezumab-gnlm (EMGALITY) 120 MG/ML SOAJ Inject 120 mg into the skin every 30 (thirty) days. 1.12 mL 11   SUMAtriptan (IMITREX) 100 MG tablet TAKE 1 TAB BY MOUTH EVERY 2 HOURS AS NEEDED FOR MIGRAINE. MAY REPEAT IN 2 HOURS IF HEADACHE PERSISTS 12 tablet 1   tiZANidine (ZANAFLEX) 4 MG tablet Take 4 mg by mouth every 6 (six) hours as needed.     Ubrogepant (UBRELVY) 100 MG TABS Take 100 mg by mouth every 2 (two) hours as needed. Maximum 200mg  a day. 16 tablet 11   venlafaxine XR (EFFEXOR-XR) 150 MG 24 hr capsule Take 150 mg by mouth once.     No current facility-administered medications for this visit.  Allergies as of 06/27/2021   (No Known Allergies)    Vitals: BP 106/81   Pulse (!) 110   Ht 5\' 6"  (1.676 m)   Wt 189 lb 9.6 oz (86 kg)   BMI 30.60 kg/m  Last Weight:  Wt Readings from Last 1 Encounters:  06/27/21 189 lb 9.6 oz (86 kg)   Last Height:   Ht Readings from Last 1 Encounters:  06/27/21 5\' 6"  (1.676 m)     Physical exam: Exam: Gen: NAD, conversant, well nourised, obese, well groomed                     CV: RRR, no MRG. No Carotid Bruits. No peripheral edema, warm, nontender Eyes: Conjunctivae clear without exudates or hemorrhage  Neuro: Detailed Neurologic Exam  Speech:    Speech is normal; fluent and spontaneous with normal comprehension.  Cognition:    The patient is oriented to person, place, and time;     recent and remote memory intact;     language fluent;     normal attention, concentration,     fund of knowledge Cranial Nerves:    The pupils are equal, round, and reactive to light. The fundi are  normal and spontaneous venous pulsations are present. Visual fields are full to finger confrontation. Extraocular movements are intact. Trigeminal sensation is intact and the muscles of mastication are normal. The face is symmetric. The palate elevates in the midline. Hearing intact. Voice is normal. Shoulder shrug is normal. The tongue has normal motion without fasciculations.   Coordination:    Normal finger to nose and heel to shin. Normal rapid alternating movements.   Gait:    Heel-toe and tandem gait are normal.   Motor Observation:    No asymmetry, no atrophy, and no involuntary movements noted. Tone:    Normal muscle tone.    Posture:    Posture is normal. normal erect    Strength:    Strength is V/V in the upper and lower limbs.      Sensation: intact to LT     Reflex Exam:  DTR's:    Deep tendon reflexes in the upper and lower extremities are normal bilaterally.   Toes:    The toes are downgoing bilaterally.   Clonus:    Clonus is absent.    Assessment/Plan:  Patient with chronic migraines, failed multiple medications.  Had a long talk about migraine management, especially discussion of the analgesic rebound.  We discussed acute and preventative treatments, lifestyle changes, triggers.  I provided her with literature dealing with an overview of migraines and migraine management.  - Preventative: Emgality once monthly to reduce overall migraine burden (urine pregnancy neg 06/21/2021) discussed teratogenicity, do not get pregnant on Emgality and must be off of it for at least 6 months before getting pregnant, patient reports there are no plans for any more children. - Acute/emergent: Stop ibuprofen and other over the counter meds. Can take up to 10 Imitrex a month and can take up to 16 ubrelvy a month - Ubrelvy: Please take one tablet at the onset of your headache. If it does not improve the symptoms please take one additional tablet. Do not take more then 2 tablets in  24hrs. Do not take use more then 2 to 3 times in a week.  -Patient denies vision changes, positional or exertional qualities, we discussed an MRI of the brain and I did recommend should the above plan not improve her situation I  would highly recommend it.  They agreed.  Discussed: To prevent or relieve headaches, try the following: Cool Compress. Lie down and place a cool compress on your head.  Avoid headache triggers. If certain foods or odors seem to have triggered your migraines in the past, avoid them. A headache diary might help you identify triggers.  Include physical activity in your daily routine. Try a daily walk or other moderate aerobic exercise.  Manage stress. Find healthy ways to cope with the stressors, such as delegating tasks on your to-do list.  Practice relaxation techniques. Try deep breathing, yoga, massage and visualization.  Eat regularly. Eating regularly scheduled meals and maintaining a healthy diet might help prevent headaches. Also, drink plenty of fluids.  Follow a regular sleep schedule. Sleep deprivation might contribute to headaches Consider biofeedback. With this mind-body technique, you learn to control certain bodily functions -- such as muscle tension, heart rate and blood pressure -- to prevent headaches or reduce headache pain.    Proceed to emergency room if you experience new or worsening symptoms or symptoms do not resolve, if you have new neurologic symptoms or if headache is severe, or for any concerning symptom.   Provided education and documentation from American headache Society toolbox including articles on: chronic migraine medication overuse headache, chronic migraines, prevention of migraines, behavioral and other nonpharmacologic treatments for headache.   Orders Placed This Encounter  Procedures   TSH    Meds ordered this encounter  Medications   Galcanezumab-gnlm (EMGALITY) 120 MG/ML SOAJ    Sig: Inject 120 mg into the skin every 30  (thirty) days.    Dispense:  1.12 mL    Refill:  11   Ubrogepant (UBRELVY) 100 MG TABS    Sig: Take 100 mg by mouth every 2 (two) hours as needed. Maximum 200mg  a day.    Dispense:  16 tablet    Refill:  11     Cc: Paige, , DO,  [place], [place], DO  Lucas Mallow, MD  [facility] [address] [place], [place]  Phone [phone] Fax [phone]

## 2021-06-30 ENCOUNTER — Encounter: Payer: Self-pay | Admitting: Neurology

## 2021-06-30 DIAGNOSIS — G43711 Chronic migraine without aura, intractable, with status migrainosus: Secondary | ICD-10-CM | POA: Insufficient documentation

## 2021-07-01 ENCOUNTER — Telehealth: Payer: Self-pay

## 2021-07-01 MED ORDER — SUMATRIPTAN SUCCINATE 100 MG PO TABS: ORAL_TABLET | ORAL | 1 refills | Status: DC

## 2021-07-01 MED ORDER — GABAPENTIN 600 MG PO TABS: ORAL_TABLET | ORAL | 5 refills | Status: DC

## 2021-07-01 MED ORDER — TIZANIDINE HCL 4 MG PO TABS: 4.0000 mg | ORAL_TABLET | Freq: Four times a day (QID) | ORAL | 1 refills | Status: DC | PRN

## 2021-07-01 MED ORDER — DULOXETINE HCL 30 MG PO CPEP: 30.0000 mg | ORAL_CAPSULE | Freq: Two times a day (BID) | ORAL | 1 refills | Status: DC

## 2021-07-01 NOTE — Telephone Encounter (Signed)
I submitted a PA request on CMM, Key: [id number] - PA Case ID: [id number].  Awaiting determination.

## 2021-07-02 NOTE — Telephone Encounter (Signed)
Denied Per your health plan's criteria, this drug is covered if you meet the following: (1) You have not had more than 15 headache days per month during the past 6 months. (2) You are not using this drug with a strong CYP3A4 inhibitor. (3) You do not have kidney failure (end-stage renal disease, creatinine clearance less than 31mL/ min). (4) You have tried one preferred triptan: rizatriptan. The information provided does not show that you meet the criteria listed above. Please speak with your doctor about your choices. This decision was made per the [employer] [facility] Migraine Therapy - CGRP Guideline. Name: Kelsye Loomer MID: [id number] T Decision Date: 07/01/2021

## 2021-07-03 NOTE — Telephone Encounter (Signed)
Ubrelvy  

## 2021-07-06 ENCOUNTER — Other Ambulatory Visit: Payer: Self-pay | Admitting: Family Medicine

## 2021-07-09 ENCOUNTER — Telehealth: Payer: Self-pay

## 2021-07-09 NOTE — Telephone Encounter (Signed)
Patient's mother called requesting a refill for Effexor. I did not see where you prescribed it, but she stated you did. Please advise

## 2021-07-10 MED ORDER — VENLAFAXINE HCL ER 150 MG PO CP24: 150.0000 mg | ORAL_CAPSULE | Freq: Every day | ORAL | 5 refills | Status: DC

## 2021-07-10 NOTE — Telephone Encounter (Signed)
Informed patient that medication was sent in to pharmacy

## 2021-07-11 ENCOUNTER — Telehealth: Payer: Self-pay | Admitting: *Deleted

## 2021-07-11 NOTE — Telephone Encounter (Signed)
Marissa Contreras Key: Marissa Contreras - PA Case ID: [id number] - Rx #: [id number]  PA for Emgality has been sent Waiting on Approval

## 2021-07-15 NOTE — Telephone Encounter (Signed)
Request Reference Number: [id number]. EMGALITY INJ 120MG /ML is approved through 10/11/2021. For further questions, call 10/13/2021 at [phone].

## 2021-07-18 ENCOUNTER — Telehealth: Payer: Self-pay | Admitting: *Deleted

## 2021-07-18 NOTE — Telephone Encounter (Signed)
I called pt and she stated she is doing well on emgality, now having about 1 migraine per week.  She was denied [place] for having too many migraines previously.  Will re try PA.  She appreciated this.

## 2021-07-22 ENCOUNTER — Ambulatory Visit: Payer: Medicaid Other | Admitting: Family Medicine

## 2021-07-22 NOTE — Telephone Encounter (Signed)
Re did PA for [place] PA KEY [id number].

## 2021-07-23 NOTE — Telephone Encounter (Signed)
CMM KEY [id number] for Ubrelvy 100mg  tabs initiated. Determination pending.

## 2021-07-24 NOTE — Telephone Encounter (Signed)
[name] was approved 07-23-2021 thru 07-23-2022 optum [employer] comm plan.  PA [id number].  Member ID [id number].

## 2021-08-02 ENCOUNTER — Other Ambulatory Visit: Payer: Self-pay | Admitting: Physical Medicine and Rehabilitation

## 2021-08-27 ENCOUNTER — Other Ambulatory Visit: Payer: Self-pay | Admitting: Physical Medicine and Rehabilitation

## 2021-08-27 NOTE — Telephone Encounter (Signed)
Pharmacy is sending refill request for Tizanidine. Would you like to refill?

## 2021-09-09 ENCOUNTER — Encounter (HOSPITAL_COMMUNITY): Payer: Self-pay

## 2021-09-09 ENCOUNTER — Ambulatory Visit (INDEPENDENT_AMBULATORY_CARE_PROVIDER_SITE_OTHER): Payer: Medicaid Other

## 2021-09-09 ENCOUNTER — Other Ambulatory Visit: Payer: Self-pay

## 2021-09-09 ENCOUNTER — Ambulatory Visit (HOSPITAL_COMMUNITY)
Admission: EM | Admit: 2021-09-09 | Discharge: 2021-09-09 | Disposition: A | Discharge status: Home / Self Care | Payer: Medicaid Other | Attending: Urgent Care | Admitting: Urgent Care

## 2021-09-09 DIAGNOSIS — M25511 Pain in right shoulder: Secondary | ICD-10-CM

## 2021-09-09 MED ORDER — LIDOCAINE 5 % EX PTCH: 1.0000 | MEDICATED_PATCH | CUTANEOUS | 0 refills | Status: AC

## 2021-09-09 MED ORDER — DICLOFENAC SODIUM 75 MG PO TBEC: 75.0000 mg | DELAYED_RELEASE_TABLET | Freq: Two times a day (BID) | ORAL | 0 refills | Status: AC

## 2021-09-09 NOTE — ED Provider Notes (Signed)
[facility]    CSN: [id number] Arrival date & time: 09/09/21  1041      History   Chief Complaint No chief complaint on file.   HPI Marissa Contreras is a 30 y.o. female.   31 year old female presents today with acute onset of right shoulder pain starting abruptly at 10 PM last evening.  She does have a history of chronic musculoskeletal pain including fibromyalgia for which she takes tizanidine, Effexor, duloxetine, gabapentin.  She states she was told by her PCP she cannot take Tylenol due to her liver function and states her neurologist told her to avoid NSAIDs. (Pt was previously taking 800mg  ibuprofen Q 4 hours causing rebound headaches). She reports yesterday she was helping her significant other working on a vehicle, but denies any trauma.  She works as a by trade.  She denies any known injury or prior issues to her right shoulder.  She states it hurts "all over", but seems to hurt the worst over her coracoid process.  States that touching her skin causes pain.  Any elevation above 90 degrees causes pain.  She denies any swelling, erythema, rash.    Past Medical History:  Diagnosis Date   Anxiety    Asthma    Breastfeeding (infant)    Chronic back pain    Depression    Fibromyalgia    H/O varicella    Headache    Menorrhagia 08/19/2016   Neck pain 09/28/2018   Positive GBS test 04/18/2018   Right hip pain 04/20/2019   UTI (urinary tract infection) 09/09/2017   Vaginal delivery 05/09/2018   Please schedule this patient for Postpartum visit in: 4 weeks with the following provider: Any provider For C/S patients schedule nurse incision check in weeks 2 weeks: n/a Low risk pregnancy complicated by: none Delivery mode:  SVD Anticipated Birth Control:  BTL, planned for this hospital stay  PP Procedures needed: none  Schedule Integrated BH visit: no     Patient Active Problem List   Diagnosis Date Noted   Chronic migraine without aura, with intractable migraine,  so stated, with status migrainosus 06/30/2021   Irregular periods 09/02/2020   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   Fibromyalgia 01/27/2020   Myofascial pain 01/27/2020   Fatigue 09/26/2019   Cholelithiasis 06/08/2019   Migraine 06/03/2017   Mild intermittent asthma 12/20/2014   Bilateral hip pain 12/08/2014   Depression 12/20/2013   Low back pain 02/11/2012    Past Surgical History:  Procedure Laterality Date   CHOLECYSTECTOMY     DILATION AND CURETTAGE OF UTERUS     DILATION AND EVACUATION N/A 03/08/2014   Procedure: DILATATION AND EVACUATION ;  Surgeon: 03/10/2014, MD;  Location: [facility];  Service: Gynecology;  Laterality: N/A;  with chromosomal studies, sent    TONSILLECTOMY     TUBAL LIGATION Bilateral 05/10/2018   Procedure: POST PARTUM TUBAL LIGATION;  Surgeon: 05/12/2018, MD;  Location: [facility];  Service: Gynecology;  Laterality: Bilateral;    OB History     Gravida  7   Para  3   Term  3   Preterm      AB  4   Living  3      SAB  4   IAB      Ectopic      Multiple  0   Live Births  3            Home Medications    Prior to  Admission medications   Medication Sig Start Date End Date Taking? Authorizing Provider  lidocaine (LIDODERM) 5 % Place 1 patch onto the skin daily. Remove & Discard patch within 12 hours or as directed by MD 09/09/21  Yes Magdalynn Davilla L, PA  DULoxetine (CYMBALTA) 30 MG capsule Take 1 capsule (30 mg total) by mouth 2 (two) times daily. 07/01/21   Lovorn, Aundra Millet, MD  gabapentin (NEURONTIN) 600 MG tablet takTAKE 1 TABLET(600MG ) 3 TIMES A DAY AND 2 TABLETS(1200MG ) AT BEDTIME 07/01/21   Lovorn, Megan, MD  Galcanezumab-gnlm (EMGALITY) 120 MG/ML SOAJ Inject 120 mg into the skin every 30 (thirty) days. 06/27/21   Anson Fret, MD  PROAIR HFA 108 [phone] Base) MCG/ACT inhaler TAKE 2 PUFFS BY MOUTH EVERY 6 HOURS AS NEEDED FOR WHEEZE OR SHORTNESS OF BREATH 07/08/21   Idalia Needle, Victoria J, DO  SUMAtriptan (IMITREX)  100 MG tablet TAKE 1 TAB BY MOUTH EVERY 2 HOURS AS NEEDED FOR MIGRAINE. MAY REPEAT IN 2 HOURS IF HEADACHE PERSISTS 07/01/21   Lovorn, Aundra Millet, MD  tiZANidine (ZANAFLEX) 4 MG tablet TAKE 1 TABLET BY MOUTH EVERY 6 HOURS AS NEEDED 08/27/21   Lovorn, Aundra Millet, MD  Ubrogepant (UBRELVY) 100 MG TABS Take 100 mg by mouth every 2 (two) hours as needed. Maximum 200mg  a day. 06/27/21   08/27/21, MD  venlafaxine XR (EFFEXOR-XR) 150 MG 24 hr capsule TAKE 1 CAPSULE BY MOUTH DAILY WITH BREAKFAST. 08/27/21   Lovorn, 14/6/22, MD    Family History Family History  Problem Relation Age of Onset   Fibromyalgia Mother    Arthritis Mother    Depression Mother    Migraines Mother    Hypertension Father    Diabetes Father    Migraines Brother    Diabetes Paternal Grandmother    Cancer Paternal Grandmother    Stroke Paternal Grandmother     Social History Social History   Tobacco Use   Smoking status: Every Day    Packs/day: 0.50    Types: Cigarettes    Last attempt to quit: 09/24/2017    Years since quitting: 3.9   Smokeless tobacco: Never   Tobacco comments:    quit for pregnancy  Vaping Use   Vaping Use: Former  Substance Use Topics   Alcohol use: No    Comment: occ   Drug use: No     Allergies   Patient has no known allergies.   Review of Systems Review of Systems  Constitutional:  Negative for appetite change and fever.  HENT:  Negative for congestion, ear discharge, ear pain, postnasal drip, rhinorrhea and trouble swallowing.   Respiratory:  Negative for chest tightness and shortness of breath.   Cardiovascular:  Negative for chest pain.  Musculoskeletal:  Positive for arthralgias (R shoulder) and myalgias (chronic).  Neurological:  Positive for headaches (managed by neuro).  All other systems reviewed and are negative.   Physical Exam Triage Vital Signs ED Triage Vitals [09/09/21 1242]  Enc Vitals Group     BP (!) 133/107     Pulse Rate 74     Resp 16     Temp 97.8 F (36.6  C)     Temp Source Oral     SpO2 100 %     Weight      Height      Head Circumference      Peak Flow      Pain Score 10     Pain Loc      Pain  Edu?      Excl. in GC?    No data found.  Updated Vital Signs BP (!) 133/107 (BP Location: Right Arm)    Pulse 74    Temp 97.8 F (36.6 C) (Oral)    Resp 16    LMP 08/31/2021    SpO2 100%   Visual Acuity Right Eye Distance:   Left Eye Distance:   Bilateral Distance:    Right Eye Near:   Left Eye Near:    Bilateral Near:     Physical Exam Constitutional:      Appearance: She is obese.  HENT:     Head: Normocephalic and atraumatic.  Eyes:     Pupils: Pupils are equal, round, and reactive to light.  Cardiovascular:     Rate and Rhythm: Normal rate and regular rhythm.  Pulmonary:     Effort: Pulmonary effort is normal.  Musculoskeletal:     Right shoulder: Tenderness (generalized, no point tenderness) and crepitus present. No swelling, deformity, effusion, laceration or bony tenderness. Decreased range of motion (due to pain only). Normal strength. Normal pulse.     Right upper arm: Normal. No swelling, edema, deformity, lacerations, tenderness or bony tenderness.     Comments: Negative Hawkins, neer, empty can, obriens, sulcus sign.  Skin:    General: Skin is warm.     Findings: No erythema or rash.  Neurological:     General: No focal deficit present.     Mental Status: She is alert. Mental status is at baseline.     Cranial Nerves: No cranial nerve deficit.     Motor: No weakness.     Coordination: Coordination normal.     Gait: Gait normal.     UC Treatments / Results  Labs (all labs ordered are listed, but only abnormal results are displayed) Labs Reviewed - No data to display  EKG   Radiology DG Shoulder Right  Result Date: 09/09/2021 CLINICAL DATA:  Right shoulder pain starting last night EXAM: RIGHT SHOULDER - 2+ VIEW COMPARISON:  Radiographs from 05/28/2009 FINDINGS: Normal glenohumeral and AC joint  alignment. No significant bony abnormality is observed. No significant abnormal regional calcifications. Frontal projection has the humerus internally rotated. IMPRESSION: Negative. Correlation with musculoskeletal and neurologic components of physical exam suggested in determining the need for further imaging. Electronically Signed   By: Gaylyn Rong M.D.   On: 09/09/2021 13:31    Procedures Procedures (including critical care time)  Medications Ordered in UC Medications - No data to display  Initial Impression / Assessment and Plan / UC Course  I have reviewed the triage vital signs and the nursing notes.  Pertinent labs & imaging results that were available during my care of the patient were reviewed by me and considered in my medical decision making (see chart for details).     Acute pain of right shoulder -x-ray in office normal.  Patient's degree of pain indicated in office not congruent with physical exam findings.  Question if pain might be related to overuse versus worsening of her fibromyalgia symptoms.  She does use her arms extensively for her job, therefore we will give her 48 hours out of work.  She denies any allergy or adverse reaction to NSAID's in the past, no history of GI bleed, and refuses the previously discussed prescription for Lidoderm patch, therefore we will try a short course of twice daily Voltaren with food. Final Clinical Impressions(s) / UC Diagnoses   Final diagnoses:  Acute pain of  right shoulder     Discharge Instructions      Place patch on R shoulder, wear for 12 hours, then discard patch. Place new patch 12 hours after removal of the old one. Warm epsom salt baths may be helpful. Follow up with PCP in 1-2 weeks if symptoms persist or worsen.      ED Prescriptions     Medication Sig Dispense Auth. Provider   lidocaine (LIDODERM) 5 % Place 1 patch onto the skin daily. Remove & Discard patch within 12 hours or as directed by MD 6 patch  Tennis Mckinnon L, PA      PDMP not reviewed this encounter.   Maretta Bees, [place] 09/09/21 2130

## 2021-09-09 NOTE — Discharge Instructions (Addendum)
Place patch on R shoulder, wear for 12 hours, then discard patch. Place new patch 12 hours after removal of the old one. Take diclofenac twice daily with food. Warm epsom salt baths may be helpful. Follow up with PCP in 1-2 weeks if symptoms persist or worsen.

## 2021-09-09 NOTE — ED Triage Notes (Signed)
Pt presented to the office for right shoulder that started last night. She is unable to lift anything and needs assist going to the restroom.

## 2021-09-12 ENCOUNTER — Telehealth: Payer: Self-pay

## 2021-09-12 ENCOUNTER — Other Ambulatory Visit: Payer: Self-pay | Admitting: Family Medicine

## 2021-09-12 NOTE — Telephone Encounter (Signed)
I completed a PA for Emgality on CMM, Key: [id number]. Awaiting determination.

## 2021-09-12 NOTE — Telephone Encounter (Signed)
Request Reference Number: [id number]. EMGALITY INJ 120MG /ML is approved through 09/12/2022. For further questions, call 09/14/2022 at [phone].  Will make pt aware through [facility]

## 2021-10-31 ENCOUNTER — Ambulatory Visit: Payer: Medicaid Other | Admitting: Adult Health

## 2021-10-31 ENCOUNTER — Encounter: Payer: Self-pay | Admitting: Adult Health

## 2021-11-07 ENCOUNTER — Encounter: Payer: Self-pay | Admitting: Physical Medicine and Rehabilitation

## 2021-11-07 ENCOUNTER — Other Ambulatory Visit: Payer: Self-pay | Admitting: Physical Medicine and Rehabilitation

## 2021-12-02 ENCOUNTER — Encounter: Payer: Medicaid Other | Admitting: Physical Medicine and Rehabilitation

## 2021-12-13 ENCOUNTER — Other Ambulatory Visit: Payer: Self-pay

## 2021-12-13 ENCOUNTER — Encounter
Payer: Medicaid Other | Attending: Physical Medicine and Rehabilitation | Admitting: Physical Medicine and Rehabilitation

## 2021-12-13 ENCOUNTER — Encounter: Payer: Self-pay | Admitting: Physical Medicine and Rehabilitation

## 2021-12-13 VITALS — BP 122/89 | HR 114 | Ht 66.0 in | Wt 195.0 lb

## 2021-12-13 DIAGNOSIS — M357 Hypermobility syndrome: Secondary | ICD-10-CM | POA: Diagnosis not present

## 2021-12-13 DIAGNOSIS — M797 Fibromyalgia: Secondary | ICD-10-CM | POA: Insufficient documentation

## 2021-12-13 DIAGNOSIS — F4322 Adjustment disorder with anxiety: Secondary | ICD-10-CM | POA: Diagnosis not present

## 2021-12-13 DIAGNOSIS — G894 Chronic pain syndrome: Secondary | ICD-10-CM | POA: Diagnosis not present

## 2021-12-13 DIAGNOSIS — M7918 Myalgia, other site: Secondary | ICD-10-CM | POA: Diagnosis present

## 2021-12-13 DIAGNOSIS — Z79891 Long term (current) use of opiate analgesic: Secondary | ICD-10-CM | POA: Insufficient documentation

## 2021-12-13 DIAGNOSIS — Z5181 Encounter for therapeutic drug level monitoring: Secondary | ICD-10-CM | POA: Diagnosis present

## 2021-12-13 MED ORDER — DULOXETINE HCL 60 MG PO CPEP: 60.0000 mg | ORAL_CAPSULE | Freq: Every day | ORAL | 5 refills | Status: DC

## 2021-12-13 MED ORDER — TRAMADOL HCL 50 MG PO TABS: 50.0000 mg | ORAL_TABLET | Freq: Four times a day (QID) | ORAL | 0 refills | Status: DC | PRN

## 2021-12-13 MED ORDER — TIZANIDINE HCL 4 MG PO TABS: 4.0000 mg | ORAL_TABLET | Freq: Four times a day (QID) | ORAL | 3 refills | Status: DC | PRN

## 2021-12-13 NOTE — Patient Instructions (Addendum)
Patient is a 31 yr old R handed  female with hx of anxiety and depression who here's for f/uof B/L low back pain with sciatica down B/L legs- down to toes. Likely herniated disc- causing a noxious stimulation ot spinal cord- reason likely steroids  didn't work.Also has myofascial pain syndrome and fibromyalgia based on exam. Also has hypermobility syndrome.  ?Also c/o suicidal ideation.  ? ? ?Retry Magnesium for muscle cramps- 400 mg tabs- 1-3 pills/day- for muscle cramps- is over the counter.  ? ?2. Needs to call therapy- ASAP Tommy- can help with . ? ?3. Con't regimen ? ? ?4. Patient here for trigger point injections for ? Consent done and on chart. ? ?Cleaned areas with alcohol and injected using a 27 gauge 1.5 inch needle ? ?Injected 7cc ?Using 1% Lidocaine with no EPI and 1/2 cc Kenalog mixed with lidocaine ? ?Upper traps B/L  ?Levators B/L  ?Posterior scalenes ?Middle scalenes B/L  ?Splenius Capitus- B/L  ?Pectoralis Major- B/L  ?Rhomboids- B/L  ?Infraspinatus ?Teres Major/minor ?Thoracic paraspinals B/L  ?Lumbar paraspinals- B/L  ?Other injections- cervical paraspinals B/L  ? ? ?Patient's level of pain prior was 8/10 ?Current level of pain after injections is- dropping to 6/10- heading to 3-4/10 ? ?There was no bleeding or complications. ? ?Patient was advised to drink a lot of water on day after injections to flush system ?Will have increased soreness for 12-48 hours after injections.  ?Can use Lidocaine patches the day AFTER injections ?Can use theracane on day of injections in places didn't inject ?Can use heating pad 4-6 hours AFTER injections  ? ?5. Tramadol- 50 mg q 6 hours as needed-  ?Cannot take tylenol- can take 2 tabs 2x/day or 1 tab 4x/day as needed- 1 week supply until UDS comes back.  ? ? ?6. UDS and opiate contract- will test (+) for tramadol- since had a few old ones in home- took 3 days ago.  ? ? ?7. TSH for me- because wondering about hyperthyroidism with heart rate so fast and increase in  pain.  ? ?8. F/U in 6 weeks for TrIgger point injections- wait list- and scheduled q6 weeks to get pain better controlled.  ? ?9. PCP to refer to psychiatry and can changes meds as necessary.  ?

## 2021-12-13 NOTE — Progress Notes (Signed)
? ?Subjective:  ? ? Patient ID: Marissa Contreras, female    DOB: 07/24/1991, 31 y.o.   MRN: [id number] ? ?HPI ? ?Patient is a 31 yr old R handed  female with hx of anxiety and depression who here's for f/uof B/L low back pain with sciatica down B/L legs- down to toes. Likely herniated disc- causing a noxious stimulation ot spinal cord- reason likely steroids  didn't work.Also has myofascial pain syndrome and fibromyalgia based on exam. Also has hypermobility syndrome.  ?Also c/o suicidal ideation.  ? ?Here for f.u on chronic pain.  ? ? ?Having more back pain-  ?Just in back- not radiating down legs.  ? ?Got Emgality for HA- loves the results- but it does burn.  ?Has reduced HA's- from daily migraines to 3-4x/month.  ?Has Bernita RaisinUbrelvy for As needed migraines. Likes that medicine as well.  ? ?Would like trigger point injections today- works better when add Steroid to lidocaine.  ? ? ?Still on Cymbalta 60 mg daily and Effexor 150 mg daily- ; Zanaflex 4 mg 3x/day; Gabapentin 600 mg TID.  ? ?Main issue is still back pain; and leg pain;  ?Feels like constant tiny cramps in them-  ?Started taking K+ the other day- is over the counter-  ? ? ?Mood is BAD- doesn't know if related to pain.  ? ?Suicidal ideation daily- no plan.  ?Have a list of therapists- avoided calling them.  ? ?Out of breath, heart racing by the time out of shower-  ?Exhausted all the time.  ?  ? ?Pain Inventory ?Average Pain 8 ?Pain Right Now 8 ?My pain is constant, sharp, burning, dull, stabbing, tingling, and aching ? ?In the last 24 hours, has pain interfered with the following? ?General activity 4 ?Relation with others 2 ?Enjoyment of life 3 ?What TIME of day is your pain at its worst? morning  and night ?Sleep (in general) Poor ? ?Pain is worse with: sitting, inactivity, standing, and some activites ?Pain improves with: medication and injections ?Relief from Meds: 8 ? ?Family History  ?Problem Relation Age of Onset  ? Fibromyalgia Mother   ? Arthritis Mother    ? Depression Mother   ? Migraines Mother   ? Hypertension Father   ? Diabetes Father   ? Migraines Brother   ? Diabetes Paternal Grandmother   ? Cancer Paternal Grandmother   ? Stroke Paternal Grandmother   ? ?Social History  ? ?Socioeconomic History  ? Marital status: Married  ?  Spouse name: tommy robinson  ? Number of children: 2  ? Years of education: 5212  ? Highest education level: 12th grade  ?Occupational History  ? Not on file  ?Tobacco Use  ? Smoking status: Every Day  ?  Packs/day: 0.50  ?  Types: Cigarettes  ?  Last attempt to quit: 09/24/2017  ?  Years since quitting: 4.2  ? Smokeless tobacco: Never  ? Tobacco comments:  ?  quit for pregnancy  ?Vaping Use  ? Vaping Use: Former  ?Substance and Sexual Activity  ? Alcohol use: No  ?  Comment: occ  ? Drug use: No  ? Sexual activity: Yes  ?  Birth control/protection: None  ?Other Topics Concern  ? Not on file  ?Social History Narrative  ? Lives with husband and mother in law.  Husband is 6 years older, works at [employer].  No hobbies, described herself as a "homebody"  Online courses for high school due to difficulty with social stresses.    ? ?  Social Determinants of Health  ? ?Financial Resource Strain: Not on file  ?Food Insecurity: Not on file  ?Transportation Needs: Not on file  ?Physical Activity: Not on file  ?Stress: Not on file  ?Social Connections: Not on file  ? ?Past Surgical History:  ?Procedure Laterality Date  ? CHOLECYSTECTOMY    ? DILATION AND CURETTAGE OF UTERUS    ? DILATION AND EVACUATION N/A 03/08/2014  ? Procedure: DILATATION AND EVACUATION ;  Surgeon: Purcell Nails, MD;  Location: [facility];  Service: Gynecology;  Laterality: N/A;  with chromosomal studies, sent   ? TONSILLECTOMY    ? TUBAL LIGATION Bilateral 05/10/2018  ? Procedure: POST PARTUM TUBAL LIGATION;  Surgeon: Adam Phenix, MD;  Location: [facility];  Service: Gynecology;  Laterality: Bilateral;  ? ?Past Surgical History:  ?Procedure Laterality Date  ?  CHOLECYSTECTOMY    ? DILATION AND CURETTAGE OF UTERUS    ? DILATION AND EVACUATION N/A 03/08/2014  ? Procedure: DILATATION AND EVACUATION ;  Surgeon: Purcell Nails, MD;  Location: [facility];  Service: Gynecology;  Laterality: N/A;  with chromosomal studies, sent   ? TONSILLECTOMY    ? TUBAL LIGATION Bilateral 05/10/2018  ? Procedure: POST PARTUM TUBAL LIGATION;  Surgeon: Adam Phenix, MD;  Location: [facility];  Service: Gynecology;  Laterality: Bilateral;  ? ?Past Medical History:  ?Diagnosis Date  ? Anxiety   ? Asthma   ? Breastfeeding (infant)   ? Chronic back pain   ? Depression   ? Fibromyalgia   ? H/O varicella   ? Headache   ? Menorrhagia 08/19/2016  ? Neck pain 09/28/2018  ? Positive GBS test 04/18/2018  ? Right hip pain 04/20/2019  ? UTI (urinary tract infection) 09/09/2017  ? Vaginal delivery 05/09/2018  ? Please schedule this patient for Postpartum visit in: 4 weeks with the following provider: Any provider For C/S patients schedule nurse incision check in weeks 2 weeks: n/a Low risk pregnancy complicated by: none Delivery mode:  SVD Anticipated Birth Control:  BTL, planned for this hospital stay  PP Procedures needed: none  Schedule Integrated BH visit: no   ? ?Ht 5\' 6"  (1.676 m)   Wt 195 lb (88.5 kg)   BMI 31.47 kg/m?  ? ?Opioid Risk Score:   ?Fall Risk Score:  `1 ? ?Depression screen PHQ 2/9 ? ? ?  12/13/2021  ? 11:37 AM 03/21/2021  ? 11:52 AM 02/26/2021  ? 11:13 AM 08/31/2020  ?  2:06 PM 07/26/2020  ?  9:11 AM 04/13/2020  ? 11:06 AM 01/27/2020  ? 10:25 AM  ?Depression screen PHQ 2/9  ?Decreased Interest 3 2 2 1 3 1 1   ?Down, Depressed, Hopeless 3 2 2  0 3 1 0  ?PHQ - 2 Score 6 4 4 1 6 2 1   ?Altered sleeping 3 0 3 2 3  3   ?Tired, decreased energy 3 0 3 2 3  1   ?Change in appetite 3 0 3 2 3  1   ?Feeling bad or failure about yourself  3 0 1 0 1  0  ?Trouble concentrating 3 0 3 1 2   0  ?Moving slowly or fidgety/restless 3 0 1 1 2   0  ?Suicidal thoughts 3 0 0 0 2  0  ?PHQ-9 Score 27 4 18 9 22  6   ?Difficult  doing work/chores   Somewhat difficult  Extremely dIfficult  Somewhat difficult  ?  ? ?Review of Systems  ?Constitutional: Negative.   ?  HENT: Negative.    ?Eyes: Negative.   ?Respiratory: Negative.    ?Cardiovascular:  Positive for chest pain.  ?Endocrine: Negative.   ?Genitourinary: Negative.   ?Musculoskeletal:  Positive for back pain, neck pain and neck stiffness.  ?Skin: Negative.   ?Allergic/Immunologic: Negative.   ?Neurological:  Positive for dizziness, weakness, numbness and headaches.  ?Hematological: Negative.   ?Psychiatric/Behavioral:  Positive for dysphoric mood, sleep disturbance and suicidal ideas. The patient is nervous/anxious.   ? ?   ?Objective:  ? Physical Exam ? ?HR 114-  ?Tachycardic; in 110s ?Has trochanteric bursitis- TTP over bursae B/L  ?Trigger points in shoulder,s neck and upper and lower back- very TTP ? ? ?   ?Assessment & Plan:  ? ?Patient is a 31 yr old R handed  female with hx of anxiety and depression who here's for f/uof B/L low back pain with sciatica down B/L legs- down to toes. Likely herniated disc- causing a noxious stimulation ot spinal cord- reason likely steroids  didn't work.Also has myofascial pain syndrome and fibromyalgia based on exam. Also has hypermobility syndrome.  ?Also c/o suicidal ideation. No plan- admits has no energy-  ? ? ?Retry Magnesium for muscle cramps- 400 mg tabs- 1-3 pills/day- for muscle cramps- is over the counter.  ? ?2. Needs to call therapy- ASAP Tommy- can help with . ? ?3. Con't regimen ? ? ?4. Patient here for trigger point injections for ? Consent done and on chart. ? ?Cleaned areas with alcohol and injected using a 27 gauge 1.5 inch needle ? ?Injected 7cc ?Using 1% Lidocaine with no EPI and 1/2 cc Kenalog mixed with lidocaine ? ?Upper traps B/L  ?Levators B/L  ?Posterior scalenes ?Middle scalenes B/L  ?Splenius Capitus- B/L  ?Pectoralis Major- B/L  ?Rhomboids- B/L  ?Infraspinatus ?Teres Major/minor ?Thoracic paraspinals B/L  ?Lumbar  paraspinals- B/L  ?Other injections- cervical paraspinals B/L  ? ? ?Patient's level of pain prior was 8/10 ?Current level of pain after injections is- dropping to 6/10- heading to 3-4/10 ? ?There was no bleeding or comp

## 2021-12-14 LAB — TSH: TSH: 0.398 u[IU]/mL — ABNORMAL LOW (ref 0.450–4.500)

## 2021-12-17 ENCOUNTER — Telehealth: Payer: Self-pay | Admitting: *Deleted

## 2021-12-17 NOTE — Telephone Encounter (Signed)
Marissa Contreras called and reports the tramadol is working for her and asking if she can get refills on that medication and the gabapentin.  Also her thyroid tests are back and they are low and is asking if that is something Dr Berline Chough will prescribe for? ?

## 2021-12-18 ENCOUNTER — Encounter: Payer: Self-pay | Admitting: Family Medicine

## 2021-12-18 ENCOUNTER — Ambulatory Visit (INDEPENDENT_AMBULATORY_CARE_PROVIDER_SITE_OTHER): Payer: Medicaid Other | Admitting: Family Medicine

## 2021-12-18 DIAGNOSIS — R7989 Other specified abnormal findings of blood chemistry: Secondary | ICD-10-CM | POA: Diagnosis not present

## 2021-12-18 MED ORDER — TRAMADOL HCL 50 MG PO TABS: 50.0000 mg | ORAL_TABLET | Freq: Four times a day (QID) | ORAL | 5 refills | Status: DC | PRN

## 2021-12-18 NOTE — Assessment & Plan Note (Addendum)
Likely hyperthyroid.  Will check free T4 to be sure not a central problem.   ?T4 in normal range.  Will proceed with thyroid uptake scan, which can be done 6 weeks after contrast load by my read.   ?

## 2021-12-18 NOTE — Patient Instructions (Addendum)
Your low thyroid blood test, TSH, is an opposite test.  The fact that it is low means you have an overactive thyroid which is producing too much hormone AKA hyperthyroidism.   ?I will do a blood test on you today to confirm you have an overactive thyroid.  You T4 is not an opposite test.  I would expect your T4 to be high normal or frankly elevated if you have the common reason for a low TSH. ?I will do some homework.  Typically the next step to find out the specific cause of an overactive thyroid is an uptake scan - I need to check how long I need to wait after the Sept contrast media before we can get a valid result.   ? ?

## 2021-12-18 NOTE — Telephone Encounter (Signed)
Called pt- TSH being too low concerns me- she needs to have PCP address asap.  ?She's not on any thyroid medications. She said she would call PCP once we got off phone- explained since it requires more work up, need PCP to address/or send to Endocrinology.  ? ?Also, sent in tramadol 50 mg q6 hours prn #120 with 5 refills.  ? ?Pt happy with plan ? ? ?

## 2021-12-19 ENCOUNTER — Telehealth: Payer: Self-pay | Admitting: *Deleted

## 2021-12-19 ENCOUNTER — Encounter: Payer: Self-pay | Admitting: Family Medicine

## 2021-12-19 LAB — TOXASSURE SELECT,+ANTIDEPR,UR

## 2021-12-19 LAB — T4, FREE: Free T4: 1.01 ng/dL (ref 0.82–1.77)

## 2021-12-19 MED ORDER — TRAMADOL HCL 50 MG PO TABS: 50.0000 mg | ORAL_TABLET | Freq: Four times a day (QID) | ORAL | 5 refills | Status: DC | PRN

## 2021-12-19 NOTE — Telephone Encounter (Addendum)
Tramadol cancelled at [facility] pharmacy and will be sent to [facility] pharmacy at pts request. ?

## 2021-12-19 NOTE — Telephone Encounter (Signed)
Contacted pt to inform her of the appointment time for her thyroid imaging.  It is 4/13 at 10:30am and 4/14 @ 11:00am this will be done at [facility].  Scheduler said nothing to eat or drink 4 hours prior and to hold medications until after complete. Authorization was given to scheduler also. Minnie Legros Zimmerman Rumple, [name] ? ?

## 2021-12-19 NOTE — Progress Notes (Signed)
? ? ?  SUBJECTIVE:  ? ?CHIEF COMPLAINT / HPI:  ? ?FU abnormal thyroid blood test. ?Note: patient screened positive for depression.  She is under treatment.  She is not at risk for self harm.  She did not wish to delve into that issue with me at today's visit. ?Low TSH.  Told by counselor that this problem may be interfering with the treatment of her depression.  She does have anxiety and some tremor.  No weight loss.  No tachycardia or palpitations.  No hx of head trauma.  No CNS problems that would put her at risk for central cause of low TSH   ?Did have iodinated contrast cT study in 05/2021 ? ? ? ?OBJECTIVE:  ? ?BP 102/62   Pulse (!) 105   Wt 192 lb 3.2 oz (87.2 kg)   LMP 12/13/2021   SpO2 97%   BMI 31.02 kg/m?   ?Mild fine tremor of hands and tongue. ?Cardiac RRR without m or g ?Neck, no palpable thyromegally or mass ? ?ASSESSMENT/PLAN:  ? ?Low TSH level ?Likely hyperthyroid.  Will check free T4 to be sure not a central problem.   ?T4 in normal range.  Will proceed with thyroid uptake scan, which can be done 6 weeks after contrast load by my read.    ? ? ?Moses Manners, MD ?[facility]  ?

## 2021-12-20 ENCOUNTER — Telehealth: Payer: Self-pay | Admitting: *Deleted

## 2021-12-20 ENCOUNTER — Other Ambulatory Visit: Payer: Self-pay | Admitting: Family Medicine

## 2021-12-20 NOTE — Telephone Encounter (Signed)
LVM for pt to call office to give her the updated appointment information for pts imaging. Marissa Contreras, [name] ? ? ?Appointments below are at [facility]  ?  ?4/13 @ 9:00am ?4/13 @ 1:00pm ?4/14 @ 9:00am ?  ?  ?    ?NPO for 6 hours prior, No IV contrast 6 weeks prior, Discontinue interferring meds 3-7 days, Breastfeeding females must stop for 48 hours ?  ?  ?  ?  ?If you have any questions please contact our office.  I believe the scheduler was also going to reach out to you to give you the updated times for this imaging. ?  ?  ? ?

## 2021-12-20 NOTE — Telephone Encounter (Signed)
Notified. 

## 2021-12-31 ENCOUNTER — Telehealth: Payer: Self-pay | Admitting: *Deleted

## 2021-12-31 NOTE — Telephone Encounter (Signed)
Urine drug screen for this encounter is consistent for prescribed medication. There is presence of THC as well. ?

## 2022-01-02 ENCOUNTER — Encounter (HOSPITAL_COMMUNITY): Payer: Medicaid Other

## 2022-01-02 ENCOUNTER — Ambulatory Visit (HOSPITAL_COMMUNITY): Payer: Medicaid Other

## 2022-01-03 ENCOUNTER — Ambulatory Visit (HOSPITAL_COMMUNITY): Payer: Medicaid Other

## 2022-01-03 ENCOUNTER — Other Ambulatory Visit (HOSPITAL_COMMUNITY): Payer: Medicaid Other

## 2022-01-07 ENCOUNTER — Telehealth: Payer: Self-pay | Admitting: *Deleted

## 2022-01-07 NOTE — Telephone Encounter (Signed)
Requesting a ref[facility] on [facility] [address] ?

## 2022-01-08 MED ORDER — GABAPENTIN 600 MG PO TABS: ORAL_TABLET | ORAL | 5 refills | Status: DC

## 2022-01-12 ENCOUNTER — Other Ambulatory Visit: Payer: Self-pay | Admitting: Physical Medicine and Rehabilitation

## 2022-01-16 ENCOUNTER — Ambulatory Visit (HOSPITAL_COMMUNITY)
Admission: RE | Admit: 2022-01-16 | Discharge: 2022-01-16 | Disposition: A | Discharge status: Home / Self Care | Payer: Medicaid Other | Source: Ambulatory Visit | Attending: Family Medicine | Admitting: Family Medicine

## 2022-01-16 DIAGNOSIS — R7989 Other specified abnormal findings of blood chemistry: Secondary | ICD-10-CM | POA: Insufficient documentation

## 2022-01-16 MED ORDER — SODIUM IODIDE I 131 CAPSULE
8.3000 | Freq: Once | INTRAVENOUS | Status: AC | PRN
  Administered 2022-01-16: 8.3 via ORAL

## 2022-01-17 ENCOUNTER — Ambulatory Visit (HOSPITAL_COMMUNITY)
Admission: RE | Admit: 2022-01-17 | Discharge: 2022-01-17 | Disposition: A | Discharge status: Home / Self Care | Payer: Medicaid Other | Source: Ambulatory Visit | Attending: Family Medicine | Admitting: Family Medicine

## 2022-01-17 MED ORDER — SODIUM PERTECHNETATE TC 99M INJECTION
10.2000 | Freq: Once | INTRAVENOUS | Status: AC | PRN
  Administered 2022-01-17: 10.2 via INTRAVENOUS

## 2022-01-18 ENCOUNTER — Encounter: Payer: Self-pay | Admitting: Family Medicine

## 2022-01-21 ENCOUNTER — Telehealth: Payer: Self-pay

## 2022-01-21 NOTE — Telephone Encounter (Signed)
Marissa Contreras can only get a 5 day supply of Tramadol. She wanted to know if you would call Medicaid, so she can get the full script? ? ?Patient has been advised to call Medicaid for help or advise. Because pain medication with insurance companies can be different.  ? ?[facility] and was told it maybe just the first supply. And she may get the full script the next time. She will need to call Medicaid.  ?

## 2022-01-25 ENCOUNTER — Encounter: Payer: Self-pay | Admitting: Family Medicine

## 2022-01-28 ENCOUNTER — Other Ambulatory Visit: Payer: Self-pay | Admitting: Family Medicine

## 2022-01-28 DIAGNOSIS — R7989 Other specified abnormal findings of blood chemistry: Secondary | ICD-10-CM

## 2022-01-28 NOTE — Progress Notes (Signed)
Referral to endocrinology.  ?Ezequiel [place], MD ? ?

## 2022-02-25 ENCOUNTER — Encounter: Payer: Self-pay | Admitting: *Deleted

## 2022-03-04 ENCOUNTER — Other Ambulatory Visit: Payer: Self-pay | Admitting: Physical Medicine and Rehabilitation

## 2022-03-04 ENCOUNTER — Encounter: Payer: Self-pay | Admitting: *Deleted

## 2022-03-04 ENCOUNTER — Telehealth: Payer: Self-pay | Admitting: *Deleted

## 2022-03-04 NOTE — Telephone Encounter (Signed)
Marissa Contreras is calling for refill on gabapentin and phenergan.  She is also requesting that we do something so that medicaid will allow her to get the entire month of tramadol instead of weekly. I will look into it but am not sure we can control the medicaid issue.

## 2022-03-05 MED ORDER — PROMETHAZINE HCL 12.5 MG PO TABS: 12.5000 mg | ORAL_TABLET | Freq: Four times a day (QID) | ORAL | 5 refills | Status: AC | PRN

## 2022-03-05 NOTE — Telephone Encounter (Signed)
Left message that we have refilled her medication at Dr Rodell Perna' direction and we still need her insurance card in order to process her tramadol request.

## 2022-03-06 ENCOUNTER — Telehealth: Payer: Self-pay | Admitting: *Deleted

## 2022-03-06 NOTE — Telephone Encounter (Signed)
Prior auth for tramdol sent to [facility] via [employer].

## 2022-03-06 NOTE — Telephone Encounter (Signed)
Approved through 09/05/22. Pharmacy and patient notified.

## 2022-03-08 ENCOUNTER — Encounter: Payer: Self-pay | Admitting: Family Medicine

## 2022-03-10 ENCOUNTER — Telehealth: Payer: Self-pay | Admitting: Family Medicine

## 2022-03-10 ENCOUNTER — Other Ambulatory Visit: Payer: Self-pay | Admitting: Family Medicine

## 2022-03-10 MED ORDER — VENLAFAXINE HCL ER 150 MG PO CP24: 150.0000 mg | ORAL_CAPSULE | Freq: Every day | ORAL | 1 refills | Status: DC

## 2022-03-10 NOTE — Telephone Encounter (Addendum)
Received call from OOH line from her mother regarding refill for Venlafaxine 150mg  XR. Patient has been out of this medication since yesterday. Her mom reports she is nauseous, poor sleeping, more depressed than usual, "feeling strange" etc. Denies SI. She thinks she is withdrawing from not taking her medication. She tried to request refill but it was not sent to the pharmacy. Denies feeling suicidal. Mom would like [facility] as [facility] a day.   Provided patient refill for Effexor. Recommended pt contacts [facility] one week prior to medication running out of medications and withdrawing.  Safety precautions provided to patient who expressed understanding. Will forward to patient's PCP Dr .  Idalia Needle MD PGY-3, Family Medicine

## 2022-03-26 ENCOUNTER — Encounter: Payer: Self-pay | Attending: Physical Medicine and Rehabilitation | Admitting: Physical Medicine and Rehabilitation

## 2022-03-31 ENCOUNTER — Encounter: Payer: Self-pay | Admitting: Neurology

## 2022-03-31 ENCOUNTER — Encounter: Payer: Self-pay | Admitting: Physical Medicine and Rehabilitation

## 2022-03-31 DIAGNOSIS — G43711 Chronic migraine without aura, intractable, with status migrainosus: Secondary | ICD-10-CM

## 2022-04-01 MED ORDER — EMGALITY 120 MG/ML ~~LOC~~ SOAJ: 120.0000 mg | SUBCUTANEOUS | 3 refills | Status: DC

## 2022-04-19 ENCOUNTER — Other Ambulatory Visit: Payer: Self-pay | Admitting: Physical Medicine and Rehabilitation

## 2022-06-02 ENCOUNTER — Telehealth: Payer: Self-pay | Admitting: Neurology

## 2022-06-02 ENCOUNTER — Encounter: Payer: Self-pay | Admitting: Neurology

## 2022-06-02 NOTE — Progress Notes (Deleted)
No show

## 2022-09-17 ENCOUNTER — Telehealth: Payer: Self-pay | Admitting: Family Medicine

## 2022-09-17 DIAGNOSIS — F331 Major depressive disorder, recurrent, moderate: Secondary | ICD-10-CM

## 2022-09-17 MED ORDER — VENLAFAXINE HCL ER 150 MG PO CP24: 150.0000 mg | ORAL_CAPSULE | Freq: Every day | ORAL | 0 refills | Status: DC

## 2022-09-17 NOTE — Telephone Encounter (Signed)
AFTER HOURS EMERGENCY LINE  Patient's mother calls in to emergency line on daughter's behalf. She requests refill of the Effexor medication. Mom confirms patient's name and DOB.   Mom reports "they will not see her until the 8th" for an appointment, but that patient has only two tablets left of the Effexor. She has withdrawn from this medication before and does not want to do that again.   When asked if she submitted a request on MyChart app to have the medication refilled, the mom is unsure and keeps saying we won't see her until the 8th.  There is no chart documentation as to whether or not she requested a refill and was denied for whatever reason. Despite my best attempts via questions to patient's mother, I am unclear if the patient and mother first attempted to refill the medication via pharmacy or MyChart.   Will send in 14 tablets of her medication with no refills. This will ensure she can get to her 1/08 appointment with Dr. Idalia Needle.   Fayette Pho, MD

## 2022-09-24 ENCOUNTER — Encounter: Payer: Self-pay | Admitting: *Deleted

## 2022-09-29 ENCOUNTER — Ambulatory Visit (INDEPENDENT_AMBULATORY_CARE_PROVIDER_SITE_OTHER): Payer: Medicaid Other | Admitting: Family Medicine

## 2022-09-29 ENCOUNTER — Encounter: Payer: Self-pay | Admitting: Family Medicine

## 2022-09-29 VITALS — BP 120/78 | HR 117 | Temp 98.1°F | Resp 16 | Ht 66.0 in | Wt 192.4 lb

## 2022-09-29 DIAGNOSIS — F331 Major depressive disorder, recurrent, moderate: Secondary | ICD-10-CM | POA: Diagnosis present

## 2022-09-29 DIAGNOSIS — R1013 Epigastric pain: Secondary | ICD-10-CM

## 2022-09-29 DIAGNOSIS — R7989 Other specified abnormal findings of blood chemistry: Secondary | ICD-10-CM

## 2022-09-29 MED ORDER — VENLAFAXINE HCL ER 150 MG PO CP24: 150.0000 mg | ORAL_CAPSULE | Freq: Every day | ORAL | 0 refills | Status: DC

## 2022-09-29 MED ORDER — VENLAFAXINE HCL ER 75 MG PO CP24: 75.0000 mg | ORAL_CAPSULE | Freq: Every day | ORAL | 0 refills | Status: DC

## 2022-09-29 MED ORDER — PANTOPRAZOLE SODIUM 40 MG PO TBEC
40.0000 mg | DELAYED_RELEASE_TABLET | Freq: Every day | ORAL | 0 refills | Status: DC
Start: 2022-09-29 — End: 2022-10-28

## 2022-09-29 NOTE — Progress Notes (Signed)
    SUBJECTIVE:   CHIEF COMPLAINT / HPI:   Patient presents for epigastric pain that occurs from the center of her chest up to her throat. Sometimes occurs all day sometimes comes and go. No burping. Does have IBS so endorses mixed diarrhea and constipation    Additionally she requests an additional antidepressant  Currently on Effexor 150mg  and Cymbalta 60mg  for over a year  Does have thoughts of not wanting to be here but states this is not new for her. Denies a plan. States she cut herself when she was 17 but has not attempted since.  Denies any new triggers. Open to therapy has not tried it  Does have trouble falling and staying asleep   PERTINENT  PMH / PSH: Fibromyalgia   OBJECTIVE:   BP 120/78   Pulse (!) 117   Temp 98.1 F (36.7 C)   Resp 16   Ht 5\' 6"  (1.676 m)   Wt 192 lb 6.4 oz (87.3 kg)   LMP 09/10/2022   SpO2 99%   BMI 31.05 kg/m    Physical exam General: well appearing, NAD Cardiovascular: RRR, no murmurs Lungs: CTAB. Normal WOB Abdomen: soft, non-distended, non-tender Skin: warm, dry. No edema Psych: mood and affect normal. Speech non pressured.   ASSESSMENT/PLAN:   Low TSH level TSH 0.398 nine months ago, T4 normal. Thyroid scan normal - recommend checking TSH at follow up  Depression PHQ 23 positive SI. Denies plan or prior attempts though does have a remote history of cutting when she's was 17. Currently on Effexor 150mg  and Cymbalta 60mg .  She requests an additional medication to help her symptoms.  Precepted with Dr. Leveda Anna, and will increase the Effexor to 225 mg.  She is also amenable to a psychiatry referral to help with further medication management.  She also agreed to therapy and I provided her a list of therapists. Recommended close follow up in 2 weeks.   Epigastric pain Patient endorses mid epigastric pain from her stomach to her throat that occurs intermittently almost daily for the past couple of months. Suspect this is due to acid  reflux. Has never tried medication for this before. Does not seem cardiac related and without acute abdomen on exam. Will trial Protonix 40mg  daily for 1 month and reassess if no improvement or if worsens.     Cora Collum, DO [facility]

## 2022-09-29 NOTE — Patient Instructions (Addendum)
It was great seeing you today!  For your mood I have increased your Effexor to 225mg  daily. I have also placed a referral to psychiatry for further evaluation and medication adjustments. They will call you to schedule an appointment.   For your mid epigastric pain I have prescribed a medication to take daily for the next 4 weeks to see if this improves your symptoms.   Please check-out at the front desk before leaving the clinic. I'd like to see you back in about 2-3 weeks for follow up, but if you need to be seen earlier than that for any new issues we're happy to fit you in, just give [place] a call!  Visit Reminders: - Stop by the pharmacy to pick up your prescriptions  - Continue to work on your healthy eating habits and incorporating exercise into your daily life.    Feel free to call with any questions or concerns at any time, at [phone].   Take care,  Dr. Shary Key [facility]   Therapy and Counseling Resources Most providers on this list will take Medicaid. Patients with commercial insurance or Medicare should contact their insurance company to get a list of in network providers.  [employer] (takes children) Location 1: [address] [place], [place] [address] Location 2: [place], [place] [address] [place] ([place] speaking therapist available)(habla espanol)(take medicare and medicaid)  [place], [place], [place] [address], [place] [email or web address] [phone]  [facility]:Psychiatry and Counseling [address] [place], [place] [address] [place], [place], [place] [address]      [phone]  [place] (spanish available) [place], [place] [address] [place] (take [facility] and medicare) [address] [place], [place] [address]        [phone]     [place] (virtual only) [phone]  Jinny Blossom [facility] [address], [place], [place]  Family Solutions:  [place]. [place] [phone]  [facility]:  [place] [address] [place] [phone]  [facility] (under & uninsured) [address]   [place] [phone]    [email or web address]    [place] [address]  [place]    [phone]  [facility] [place]     Phone:  [phone]     [place]  [place] [address]      [place], [place]: [place], [place], [place]   [place] ([place] therapist) [email or web address]  [address]   [place] [address]    [phone]    [facility]   [address],  [place], [place]   [place]  [place]  [facility]  [address] [place], [place]        [phone]  Open Access/Walk In Clinic under & uninsured  [facility]  [address] [place], [place] Crisis [phone]  [facility] [place],  ([place])   [place], [place]: [phone])  8:30 - 12; 1 - 2:30  [facility] of the [employer],  [address], [place]    [phone]):8:30 - 12; 2 - 3PM  [facility] [employer],  [address],  [place]; [phone]):   Mon - Fri 8 AM - 5 PM  [facility] [address] [place]  MWF 12:30 to 3:00 or call to schedule an appointment  [phone]  Specific Provider options Psychology Today  [email or web address] click on find a  therapist  enter your zip code left side and select or tailor a therapist for your specific need.   [facility] Provider Directory [email or web address]  (Medicaid)   Follow all drop down to find a provider  Social Support program Mental Health [place] [phone] or PhotoSolver.pl [address], [place], [place] Recovery support and educational   24- Hour Availability:   [facility]  [address] [place], [place] [phone] Crisis [phone]  Family Service of the [employer] [phone]  [facility]  [phone]   [facility]  [phone] (after hours)  Therapeutic Alternative/Mobile Crisis   [phone]  [place] National Suicide Hotline  [phone] [place] Child[place])  Call 911 or go to emergency room  [facility]  [phone]);  [place] and [employer]  [phone]); [place], [place], [place], [place], [place], [place], [place]

## 2022-09-30 DIAGNOSIS — R1013 Epigastric pain: Secondary | ICD-10-CM | POA: Insufficient documentation

## 2022-09-30 NOTE — Assessment & Plan Note (Signed)
PHQ 23 positive SI. Denies plan or prior attempts though does have a remote history of cutting when she's was 17. Currently on Effexor 150mg  and Cymbalta 60mg .  She requests an additional medication to help her symptoms.  Precepted with Dr. Andria Frames, and will increase the Effexor to 225 mg.  She is also amenable to a psychiatry referral to help with further medication management.  She also agreed to therapy and I provided her a list of therapists. Recommended close follow up in 2 weeks.

## 2022-09-30 NOTE — Assessment & Plan Note (Signed)
TSH 0.398 nine months ago, T4 normal. Thyroid scan normal - recommend checking TSH at follow up

## 2022-09-30 NOTE — Assessment & Plan Note (Signed)
Patient endorses mid epigastric pain from her stomach to her throat that occurs intermittently almost daily for the past couple of months. Suspect this is due to acid reflux. Has never tried medication for this before. Does not seem cardiac related and without acute abdomen on exam. Will trial Protonix 40mg  daily for 1 month and reassess if no improvement or if worsens.

## 2022-10-06 NOTE — Telephone Encounter (Signed)
Request Reference Number: [id number]. EMGALITY INJ 120MG /ML is approved through 10/07/2023. For further questions, call [employer] at [phone].  Emgality Approved

## 2022-10-06 NOTE — Telephone Encounter (Signed)
Marissa Contreras (Key: [id number]) Emgality 120MG /ML auto-injectors (migraine)   PA Emgality complete waiting  on approval

## 2022-10-14 ENCOUNTER — Other Ambulatory Visit: Payer: Self-pay | Admitting: Family Medicine

## 2022-10-14 DIAGNOSIS — F331 Major depressive disorder, recurrent, moderate: Secondary | ICD-10-CM

## 2022-10-16 ENCOUNTER — Encounter (HOSPITAL_COMMUNITY): Payer: Self-pay

## 2022-10-16 ENCOUNTER — Emergency Department (HOSPITAL_COMMUNITY)
Admission: EM | Admit: 2022-10-16 | Discharge: 2022-10-16 | Disposition: A | Discharge status: Home / Self Care | Payer: Medicaid Other | Attending: Emergency Medicine | Admitting: Emergency Medicine

## 2022-10-16 ENCOUNTER — Other Ambulatory Visit: Payer: Self-pay

## 2022-10-16 DIAGNOSIS — R000 Tachycardia, unspecified: Secondary | ICD-10-CM | POA: Diagnosis not present

## 2022-10-16 DIAGNOSIS — Z1152 Encounter for screening for COVID-19: Secondary | ICD-10-CM | POA: Insufficient documentation

## 2022-10-16 DIAGNOSIS — R5383 Other fatigue: Secondary | ICD-10-CM | POA: Diagnosis not present

## 2022-10-16 DIAGNOSIS — R42 Dizziness and giddiness: Secondary | ICD-10-CM | POA: Insufficient documentation

## 2022-10-16 LAB — URINALYSIS, ROUTINE W REFLEX MICROSCOPIC
Bilirubin Urine: NEGATIVE
Glucose, UA: NEGATIVE mg/dL
Ketones, ur: NEGATIVE mg/dL
Nitrite: NEGATIVE
Protein, ur: NEGATIVE mg/dL
Specific Gravity, Urine: 1.015 (ref 1.005–1.030)
pH: 6 (ref 5.0–8.0)

## 2022-10-16 LAB — CBC WITH DIFFERENTIAL/PLATELET
Abs Immature Granulocytes: 0.02 10*3/uL (ref 0.00–0.07)
Basophils Absolute: 0 10*3/uL (ref 0.0–0.1)
Basophils Relative: 0 %
Eosinophils Absolute: 0 10*3/uL (ref 0.0–0.5)
Eosinophils Relative: 0 %
HCT: 43.3 % (ref 36.0–46.0)
Hemoglobin: 14.3 g/dL (ref 12.0–15.0)
Immature Granulocytes: 0 %
Lymphocytes Relative: 24 %
Lymphs Abs: 2.4 10*3/uL (ref 0.7–4.0)
MCH: 29.1 pg (ref 26.0–34.0)
MCHC: 33 g/dL (ref 30.0–36.0)
MCV: 88 fL (ref 80.0–100.0)
Monocytes Absolute: 0.7 10*3/uL (ref 0.1–1.0)
Monocytes Relative: 7 %
Neutro Abs: 6.9 10*3/uL (ref 1.7–7.7)
Neutrophils Relative %: 69 %
Platelets: 317 10*3/uL (ref 150–400)
RBC: 4.92 MIL/uL (ref 3.87–5.11)
RDW: 12.8 % (ref 11.5–15.5)
WBC: 10 10*3/uL (ref 4.0–10.5)
nRBC: 0 % (ref 0.0–0.2)

## 2022-10-16 LAB — COMPREHENSIVE METABOLIC PANEL
ALT: 19 U/L (ref 0–44)
AST: 17 U/L (ref 15–41)
Albumin: 3.8 g/dL (ref 3.5–5.0)
Alkaline Phosphatase: 105 U/L (ref 38–126)
Anion gap: 8 (ref 5–15)
BUN: 12 mg/dL (ref 6–20)
CO2: 23 mmol/L (ref 22–32)
Calcium: 8.8 mg/dL — ABNORMAL LOW (ref 8.9–10.3)
Chloride: 106 mmol/L (ref 98–111)
Creatinine, Ser: 0.7 mg/dL (ref 0.44–1.00)
GFR, Estimated: >60 mL/min (ref >60)
Glucose, Bld: 74 mg/dL (ref 70–99)
Potassium: 3.4 mmol/L — ABNORMAL LOW (ref 3.5–5.1)
Sodium: 137 mmol/L (ref 135–145)
Total Bilirubin: 0.3 mg/dL (ref 0.3–1.2)
Total Protein: 6.9 g/dL (ref 6.5–8.1)

## 2022-10-16 LAB — WET PREP, GENITAL
Clue Cells Wet Prep HPF POC: NONE SEEN
Sperm: NONE SEEN
Trich, Wet Prep: NONE SEEN
WBC, Wet Prep HPF POC: <10 (ref <10)
Yeast Wet Prep HPF POC: NONE SEEN

## 2022-10-16 LAB — PREGNANCY, URINE: Preg Test, Ur: NEGATIVE

## 2022-10-16 LAB — D-DIMER, QUANTITATIVE: D-Dimer, Quant: <0.27 ug/mL-FEU (ref 0.00–0.50)

## 2022-10-16 LAB — RESP PANEL BY RT-PCR (RSV, FLU A&B, COVID)  RVPGX2
Influenza A by PCR: NEGATIVE
Influenza B by PCR: NEGATIVE
Resp Syncytial Virus by PCR: NEGATIVE
SARS Coronavirus 2 by RT PCR: NEGATIVE

## 2022-10-16 LAB — MAGNESIUM: Magnesium: 2.3 mg/dL (ref 1.7–2.4)

## 2022-10-16 LAB — TSH: TSH: 0.518 u[IU]/mL (ref 0.350–4.500)

## 2022-10-16 MED ORDER — SODIUM CHLORIDE 0.9 % IV BOLUS
1000.0000 mL | Freq: Once | INTRAVENOUS | Status: AC
  Administered 2022-10-16: 1000 mL via INTRAVENOUS

## 2022-10-16 NOTE — ED Triage Notes (Signed)
Pt reports she has had increased dizziness since January 18th.  Pt reports she is currently on her menstrual cycle and her vagina feels irritated so she is concerned she has a bacteria infection that may be making her dizziness worse.  Pt then stated she also may have some thyroid issues as her pain management doctor tested her thyroid because of intermittent tachycardia and told her she had some borderline readings back in November and got her an appointment with an endocrinologist in March.

## 2022-10-16 NOTE — ED Provider Notes (Signed)
[facility] Provider Note   CSN: [id number] Arrival date & time: 10/16/22  2008     History  Chief Complaint  Patient presents with   Dizziness    Marissa Contreras is a 32 y.o. female.  She is here with a complaint of 1 week of intermittent dizziness lightheadedness fatigue.  Has been unable to work.  She is also noticed some discomfort in her vagina and is worried she might have an infection.  She is on her menstrual period right now.  She denies any cough chest pain abdominal pain nausea vomiting or urinary symptoms.  Her doctor just increased her Effexor from 1 50-2 25 a couple of weeks ago.  She has been told her thyroid is off and is waiting for an endocrinology appointment in March.  No fevers or chills  The history is provided by the patient.  Dizziness Quality:  Lightheadedness Onset quality:  Gradual Duration:  1 week Timing:  Intermittent Progression:  Unchanged Chronicity:  New Relieved by:  Nothing Worsened by:  Standing up Ineffective treatments:  Lying down Associated symptoms: no chest pain, no headaches, no nausea, no shortness of breath and no vomiting        Home Medications Prior to Admission medications   Medication Sig Start Date End Date Taking? Authorizing Provider  ADVAIR DISKUS 250-50 MCG/ACT AEPB 1 puff 2 (two) times daily. 10/18/21   [provider]  DULoxetine (CYMBALTA) 60 MG capsule TAKE 1 CAPSULE BY MOUTH EVERY DAY 01/13/22   Lovorn, Jinny Blossom, MD  gabapentin (NEURONTIN) 600 MG tablet TAKE 1 TABLET(600MG ) 3 TIMES A DAY AND 2 TABLETS(1200MG ) AT BEDTIME 03/05/22   Lovorn, Megan, MD  Galcanezumab-gnlm (EMGALITY) 120 MG/ML SOAJ Inject 120 mg into the skin every 30 (thirty) days. 04/01/22   Melvenia Beam, MD  lidocaine (LIDODERM) 5 % Place 1 patch onto the skin daily. Remove & Discard patch within 12 hours or as directed by MD 09/09/21   Geryl Councilman L, PA  pantoprazole (PROTONIX) 40 MG tablet Take 1  tablet (40 mg total) by mouth daily. 09/29/22 10/29/22  Shary Key, DO  promethazine (PHENERGAN) 12.5 MG tablet Take 1-2 tablets (12.5-25 mg total) by mouth every 6 (six) hours as needed for nausea or vomiting. 03/05/22   Lovorn, Jinny Blossom, MD  tiZANidine (ZANAFLEX) 4 MG tablet TAKE 1 TABLET BY MOUTH EVERY 6 HOURS AS NEEDED Patient not taking: Reported on 09/29/2022 04/21/22   Lovorn, Jinny Blossom, MD  traMADol (ULTRAM) 50 MG tablet Take 1 tablet (50 mg total) by mouth every 6 (six) hours as needed. 12/19/21   Lovorn, Jinny Blossom, MD  Ubrogepant (UBRELVY) 100 MG TABS Take 100 mg by mouth every 2 (two) hours as needed. Maximum 200mg  a day. 06/27/21   Melvenia Beam, MD  venlafaxine XR (EFFEXOR XR) 75 MG 24 hr capsule Take 1 capsule (75 mg total) by mouth daily with breakfast. 09/29/22   Shary Key, DO  venlafaxine XR (EFFEXOR-XR) 150 MG 24 hr capsule Take 1 capsule (150 mg total) by mouth daily with breakfast. 10/14/22 11/13/22  Shary Key, DO  VENTOLIN HFA 108 (90 Base) MCG/ACT inhaler TAKE 2 PUFFS BY MOUTH EVERY 6 HOURS AS NEEDED FOR WHEEZE OR SHORTNESS OF BREATH 09/12/21   Shary Key, DO      Allergies    Patient has no known allergies.    Review of Systems   Review of Systems  Constitutional:  Positive for fatigue.  Respiratory:  Negative for shortness of breath.   Cardiovascular:  Negative for chest pain.  Gastrointestinal:  Negative for nausea and vomiting.  Genitourinary:  Positive for vaginal bleeding.  Neurological:  Positive for dizziness. Negative for headaches.    Physical Exam Updated Vital Signs BP (!) 129/95 (BP Location: Left Arm)   Pulse (!) 120   Temp 98.4 F (36.9 C) (Oral)   Resp 16   Ht 5\' 6"  (1.676 m)   Wt 87.5 kg   LMP 10/12/2022 (Exact Date)   SpO2 100%   BMI 31.15 kg/m  Physical Exam Vitals and nursing note reviewed.  Constitutional:      General: She is not in acute distress.    Appearance: Normal appearance. She is well-developed.  HENT:     Head:  Normocephalic and atraumatic.  Eyes:     Conjunctiva/sclera: Conjunctivae normal.  Cardiovascular:     Rate and Rhythm: Regular rhythm. Tachycardia present.     Heart sounds: No murmur heard. Pulmonary:     Effort: Pulmonary effort is normal. No respiratory distress.     Breath sounds: Normal breath sounds.  Abdominal:     Palpations: Abdomen is soft.     Tenderness: There is no abdominal tenderness.  Genitourinary:    Comments: Pelvic exam done with tech Benjamine Mola as chaperone.  Normal external genitalia.  No vaginal bleeding or discharge.  Sample sent for GC chlamydia and wet prep. Musculoskeletal:        General: No swelling.     Cervical back: Neck supple.  Skin:    General: Skin is warm and dry.     Capillary Refill: Capillary refill takes less than 2 seconds.  Neurological:     General: No focal deficit present.     Mental Status: She is alert.     ED Results / Procedures / Treatments   Labs (all labs ordered are listed, but only abnormal results are displayed) Labs Reviewed  URINALYSIS, ROUTINE W REFLEX MICROSCOPIC - Abnormal; Notable for the following components:      Result Value   Hgb urine dipstick LARGE (*)    Leukocytes,Ua SMALL (*)    Bacteria, UA RARE (*)    All other components within normal limits  COMPREHENSIVE METABOLIC PANEL - Abnormal; Notable for the following components:   Potassium 3.4 (*)    Calcium 8.8 (*)    All other components within normal limits  WET PREP, GENITAL  RESP PANEL BY RT-PCR (RSV, FLU A&B, COVID)  RVPGX2  CBC WITH DIFFERENTIAL/PLATELET  MAGNESIUM  PREGNANCY, URINE  TSH  T4, FREE  D-DIMER, QUANTITATIVE  T3  GC/CHLAMYDIA PROBE AMP ([place]) NOT AT [facility]    EKG EKG Interpretation  Date/Time:  Thursday October 16 2022 21:42:41 EST Ventricular Rate:  112 PR Interval:  148 QRS Duration: 82 QT Interval:  319 QTC Calculation: 436 R Axis:   79 Text Interpretation: Sinus tachycardia Low voltage, precordial leads  Borderline T abnormalities, diffuse leads increased rate from prior 11/21 Confirmed by Aletta Edouard [phone]) on 10/16/2022 9:45:59 PM  Radiology No results found.  Procedures Procedures    Medications Ordered in ED Medications  sodium chloride 0.9 % bolus 1,000 mL (0 mLs Intravenous Stopped 10/16/22 2300)    ED Course/ Medical Decision Making/ A&P                             Medical Decision Making Amount and/or Complexity of Data Reviewed Labs: ordered.  This patient complains of dizziness fatigue vaginal irritation; this involves an extensive number of treatment Options and is a complaint that carries with it a high risk of complications and morbidity. The differential includes anemia, dehydration, metabolic derangement, endocrine, infection, anxiety, medication side effect  I ordered, reviewed and interpreted labs, which included CBC normal chemistries fairly normal other than mildly low potassium, LFTs normal, D-dimer normal, thyroid studies pending ultimately normal, urinalysis without clear signs of infection, wet prep negative, GC chlamydia pending, COVID and flu negative I ordered medication IV fluids and reviewed PMP when indicated.  Additional history obtained from patient's husband Previous records obtained and reviewed in epic no recent admissions Cardiac monitoring reviewed, sinus tachycardia improving to normal sinus rhythm Social determinants considered, patient with multiple barriers including tobacco depression decreased activity unmet transportation needs Critical Interventions: None  After the interventions stated above, I reevaluated the patient and found patient to be hemodynamically stable and symptomatically improving Admission and further testing considered, no indications for admission at this time.  Recommended close follow-up with PCP.  Return instructions discussed         Final Clinical Impression(s) / ED Diagnoses Final diagnoses:   Dizziness  Fatigue, unspecified type    Rx / DC Orders ED Discharge Orders     None         Terrilee Files, MD 10/17/22 1011

## 2022-10-16 NOTE — Discharge Instructions (Signed)
You are seen in the emergency department for dizziness fatigue.  You had lab work EKG urinalysis.  Your COVID and flu test were negative.  Your blood counts were fairly unremarkable.  Your thyroid tests are pending at time of discharge.  It will be important for you to stay well-hydrated and follow-up with your primary care doctor.  You will also need to keep your appointment with endocrinology.  Return to the emergency department if any worsening or concerning symptoms

## 2022-10-17 ENCOUNTER — Telehealth: Payer: Self-pay

## 2022-10-17 LAB — T4, FREE: Free T4: 0.74 ng/dL (ref 0.61–1.12)

## 2022-10-17 NOTE — Telephone Encounter (Addendum)
Transition Care Management Unsuccessful Follow-up Telephone Call  Date of discharge and from where:  10/16/22 [facility]  Attempts:  1st Attempt  Reason for unsuccessful TCM follow-up call:  Left voice message

## 2022-10-18 ENCOUNTER — Telehealth: Payer: Self-pay | Admitting: Family Medicine

## 2022-10-18 ENCOUNTER — Other Ambulatory Visit: Payer: Self-pay | Admitting: Family Medicine

## 2022-10-18 DIAGNOSIS — F331 Major depressive disorder, recurrent, moderate: Secondary | ICD-10-CM

## 2022-10-18 MED ORDER — VENLAFAXINE HCL ER 150 MG PO CP24: 150.0000 mg | ORAL_CAPSULE | Freq: Every day | ORAL | 0 refills | Status: DC

## 2022-10-18 NOTE — Telephone Encounter (Signed)
After hours call  Received call from patient regarding her experiencing withdrawal symptoms from running out of her effexor that caused her to have a recent ED visit. Per chart review, it seems she was last seen by PCP on 1/8 who increased her dose and instructed her to follow up in 2 weeks. She admits that she did not pay attention to when her medication was finishing up, I explained to her that medication refills are not an appropriate use of the after hours emergency line. She voiced understanding. I explained to her that it is imperative that she maintains follow up since we want to ensure that she is tolerating her high dose, I agreed to give her a few days worth of her effexor only if she follows up soon next week which she agreed. I emphasized that she needs to make it this appointment in order to be able to get further refills and I encouraged her to keep track in the future so that her PCP can appropriate refill her medications in a timely manner so she does not have to be without them. She voiced understanding of this. Sent refill in for 5 days of effexor as I do not want patient to undergo further harm and experience withdrawal, scheduled close mood follow up on 1/29 in access to care clinic. All questions answered and patient was very appreciative.

## 2022-10-20 ENCOUNTER — Ambulatory Visit: Payer: Self-pay

## 2022-10-20 LAB — GC/CHLAMYDIA PROBE AMP (~~LOC~~) NOT AT ARMC
Chlamydia: NEGATIVE
Comment: NEGATIVE
Comment: NORMAL
Neisseria Gonorrhea: NEGATIVE

## 2022-10-20 NOTE — Telephone Encounter (Signed)
Transition Care Management Unsuccessful Follow-up Telephone Call  Date of discharge and from where:  10/16/22 [facility]  Attempts:  2nd Attempt  Reason for unsuccessful TCM follow-up call:  No answer/busy.

## 2022-10-20 NOTE — Progress Notes (Deleted)
    SUBJECTIVE:   CHIEF COMPLAINT / HPI: Admitted follow-up  Effexor increased to 225 mg on 09/29/2022 and cymbalta continued at 60 mg.  Seen in the emergency department on 10/16/2022 due to intermittent dizziness and lightheadedness and was given fluids with improvement.  PERTINENT  PMH / PSH: ***  OBJECTIVE:   LMP 10/12/2022 (Exact Date)   ***  ASSESSMENT/PLAN:   No problem-specific Assessment & Plan notes found for this encounter.     Gerrit Heck, MD [place]

## 2022-10-21 ENCOUNTER — Other Ambulatory Visit: Payer: Self-pay | Admitting: Family Medicine

## 2022-10-21 ENCOUNTER — Telehealth: Payer: Self-pay | Admitting: Family Medicine

## 2022-10-21 DIAGNOSIS — F331 Major depressive disorder, recurrent, moderate: Secondary | ICD-10-CM

## 2022-10-21 LAB — T3: T3, Total: 97 ng/dL (ref 71–180)

## 2022-10-21 NOTE — Progress Notes (Deleted)
    SUBJECTIVE:   CHIEF COMPLAINT / HPI:   ED results: negative (UA contaminant), K 3.4  Depression f/u  PERTINENT  PMH / PSH: ***  OBJECTIVE:   LMP 10/12/2022 (Exact Date)  ***  General: NAD, pleasant, able to participate in exam Cardiac: RRR, no murmurs. Respiratory: CTAB, normal effort, No wheezes, rales or rhonchi Abdomen: Bowel sounds present, nontender, nondistended Extremities: no edema or cyanosis. Skin: warm and dry, no rashes noted Neuro: alert, no obvious focal deficits Psych: Normal affect and mood  ASSESSMENT/PLAN:   No problem-specific Assessment & Plan notes found for this encounter.     Dr. Colletta [place], [place]    {    This will disappear when note is signed, click to select method of visit    :1}

## 2022-10-21 NOTE — Telephone Encounter (Signed)
I sent a message to the referral coordinator with Behavioral health to see what is the best contact number for the patient to call back about scheduling.  I will update the patient once I hear back.  Undra Harriman,[name]

## 2022-10-21 NOTE — Telephone Encounter (Signed)
Patient called stating she didn't make it yesterday due to mood and migraine. She stated the doctor was suppose to put in a referral for psychiatry for her mood. I didn't see a referral in chart.

## 2022-10-21 NOTE — Telephone Encounter (Signed)
Patient stated she is needing a refill soon on her effexor 100mg .   Please Advise.   Thanks!

## 2022-10-22 ENCOUNTER — Other Ambulatory Visit: Payer: Self-pay | Admitting: Family Medicine

## 2022-10-23 ENCOUNTER — Ambulatory Visit: Payer: Medicaid Other | Admitting: Family Medicine

## 2022-10-23 ENCOUNTER — Other Ambulatory Visit: Payer: Self-pay | Admitting: Family Medicine

## 2022-10-23 MED ORDER — VENLAFAXINE HCL ER 150 MG PO CP24: 150.0000 mg | ORAL_CAPSULE | Freq: Every day | ORAL | 0 refills | Status: DC

## 2022-10-23 MED ORDER — VENLAFAXINE HCL ER 75 MG PO CP24: 75.0000 mg | ORAL_CAPSULE | Freq: Every day | ORAL | 0 refills | Status: DC

## 2022-10-24 ENCOUNTER — Encounter: Payer: Self-pay | Admitting: Student

## 2022-10-24 ENCOUNTER — Ambulatory Visit (INDEPENDENT_AMBULATORY_CARE_PROVIDER_SITE_OTHER): Payer: Medicaid Other | Admitting: Student

## 2022-10-24 VITALS — BP 118/78 | HR 127 | Ht 66.0 in | Wt 192.0 lb

## 2022-10-24 DIAGNOSIS — R42 Dizziness and giddiness: Secondary | ICD-10-CM

## 2022-10-24 NOTE — Patient Instructions (Signed)
It was wonderful to meet you today. Thank you for allowing me to be a part of your care. Below is a short summary of what we discussed at your visit today:  It is reassuring to know that you have not had any dizzy spells in 4 days.  I suspect your dizziness is likely secondary to dehydration.  Please make sure you are adequately hydrated with water, Gatorade Pedialyte's.  Dehydration could also be attributing to your increase HR and your medication could attribute that as well.   Please bring all of your graft I suspect medications to every appointment!  If you have any questions or concerns, please do not hesitate to contact us via phone or MyChart message.   Alen Bleacher, MD [facility]

## 2022-10-24 NOTE — Progress Notes (Signed)
    SUBJECTIVE:   CHIEF COMPLAINT / HPI:   Patient is a 32 year old female presenting today for follow-up visit to the ED.  She was sent in the ED for symptoms of dizziness.  Labs in the ED were unremarkable which include STD, CBC, TSH and BMP.  Patient reports has been consciously drinking and to stay hydrated and has not had any dizzy episodes since Sunday. Her only concerns is she was told in the ED her EKG showed sinus tachycardia and she has noticed intermittent elevated HR on her apple watch starting around 9 months ago and sometimes in her clinic visit. Denies any chest pain.   PERTINENT  PMH / PSH: Review  OBJECTIVE:   BP 118/78   Pulse (!) 127   Ht 5\' 6"  (1.676 m)   Wt 192 lb (87.1 kg)   LMP 10/12/2022 (Exact Date)   SpO2 98%   BMI 30.99 kg/m    Physical Exam General: Alert, well appearing, NAD Cardiovascular: RRR, No Murmurs, Normal S2/S2 Respiratory: CTAB, No wheezing or Rales Abdomen: No distension or tenderness Extremities: No edema on extremities   Neuro: CN II-XII intact, No gross neuro deficit   ASSESSMENT/PLAN:   Dizziness Patient's symptoms have improved with no episodes since discharge from the ED with increased fluid intake. Suspect her dizziness is likely secondary to dehydration given report of poor hydration acknowledged by patient and her boyfriend. Vitals show normal BP but tachycardic to 127.  -Encouraged patient to continue adequate hydration -Reviewed return precuations  Tachycardia Review patient has had intermittent tachycardic readings as far back as 4 years ago. Today HR was 127 and on her apple watch 109.  In the ED EKG show ST with HR of 112. Recent thyroid study was normal. Suspect her intermittent tachycardia could be attributed to her dehydration.  Other possible causes includes medication on.  She is currently on Effexor for depression  and will Ubrogepant for her migraine which could cause elevated HR. Patient denies any chest pain, or  headache. On exam no LE edema noted. -Encouraged adequate hydration -Discussed with pt if HR remain elevated will consider holding those meds and if no change will likely need a cardiology referral for further evaluation.  Alen Bleacher, MD [place]

## 2022-10-28 ENCOUNTER — Other Ambulatory Visit: Payer: Self-pay | Admitting: Family Medicine

## 2022-10-28 ENCOUNTER — Encounter: Payer: Self-pay | Admitting: Neurology

## 2022-10-28 DIAGNOSIS — G43009 Migraine without aura, not intractable, without status migrainosus: Secondary | ICD-10-CM

## 2022-10-29 MED ORDER — UBRELVY 100 MG PO TABS: 100.0000 mg | ORAL_TABLET | ORAL | 0 refills | Status: DC | PRN

## 2022-11-04 ENCOUNTER — Ambulatory Visit: Payer: Medicaid Other | Admitting: Neurology

## 2022-11-04 ENCOUNTER — Encounter: Payer: Self-pay | Admitting: Neurology

## 2022-11-04 NOTE — Progress Notes (Deleted)
[facility]    Provider:  Dr Jaynee Eagles Requesting Provider: Shary Key, DO Primary Care Provider:  Shary Key, DO  CC:  Migraines  She is on [place] and emgality.   HPI 06/27/2021:  Marissa Contreras is a 32 y.o. female here as requested by Shary Key, DO for migraines.  I reviewed Dr. Francee Piccolo notes: Patient has chronic migraine headaches, she reports several year history of daily chronic migraines, has previously been treated intermittently at a pain management clinic, has never had successful resolution of headache syndrome, its global associated with photophobia, mild nausea as a typical presentation, she does not have aura she also endorses nausea.  At 1 point she was treated with naltrexone for her headache syndrome and that initially worked but then it tapered off.  She is also been treated with sumatriptan in the past.  Migraines started about 3 years ago after birth of child. Can be unilateral or whole head can hurt, she gets photo/phonophobia, dull, throbbing, sharp, pounding, nausea, usually last all day 24 hours, severe or moderately severe in pain, no vomiting, no vision changes, no numbness or tingling, not positional, anything can trigger it such as loud music, kid screaming or she can just get it, stress, not worse during period. Daily headaches and daily migraines and at least 4-5days a week is severe. No other focal neurologic deficits, associated symptoms, inciting events or modifiable factors. Husband is here and provides much information.  Reviewed notes, labs and imaging from outside physicians, which showed:  From a thorough review of records, medication tried that can be used in migraine management includes: Tylenol, amitriptyline, Stadol, Celexa, Flexeril, Decadron injections, Benadryl, Cymbalta, Prozac, gabapentin, ibuprofen, Toradol injections, Robaxin, Reglan oral and injections, naltrexone, naproxen, nifedipine (calcium channel  blocker), Zofran, prednisone tablets, Phenergan, Zoloft, sumatriptan, tizanidine, tramadol, Effexor, trazodone, amitriptyline, propranolol contraindicated because of hypotensive, topiramate, maxalt   CT 08/28/2020: showed No acute intracranial abnormalities including mass lesion or mass effect, hydrocephalus, extra-axial fluid collection, midline shift, hemorrhage, or acute infarction, large ischemic events (personally reviewed images)   Review of Systems: Patient complains of symptoms per HPI as well as the following symptoms headache. Pertinent negatives and positives per HPI. All others negative.   Social History   Socioeconomic History   Marital status: Married    Spouse name: tommy robinson   Number of children: 2   Years of education: 12   Highest education level: 12th grade  Occupational History   Not on file  Tobacco Use   Smoking status: Every Day    Packs/day: 1.00    Types: Cigarettes    Last attempt to quit: 09/24/2017    Years since quitting: 5.1   Smokeless tobacco: Never   Tobacco comments:    quit for pregnancy  Vaping Use   Vaping Use: Some days  Substance and Sexual Activity   Alcohol use: No    Comment: occ   Drug use: No   Sexual activity: Yes    Birth control/protection: None  Other Topics Concern   Not on file  Social History Narrative   Lives with husband and mother in [place].  Husband is 52 years older, works at [employer].  No hobbies, described herself as a "homebody"  Online courses for high school due to difficulty with social stresses.     Social Determinants of Health   Financial Resource Strain: Low Risk  (04/29/2018)   Overall Financial Resource Strain (CARDIA)    Difficulty of Paying  Living Expenses: Not hard at all  Food Insecurity: No Food Insecurity (04/29/2018)   Hunger Vital Sign    Worried About Running Out of Food in the Last Year: Never true    Ran Out of Food in the Last Year: Never true  Transportation Needs: Unmet Transportation  Needs (04/29/2018)   PRAPARE - Transportation    Lack of Transportation (Medical): Yes    Lack of Transportation (Non-Medical): Yes  Physical Activity: Inactive (04/29/2018)   Exercise Vital Sign    Days of Exercise per Week: 0 days    Minutes of Exercise per Session: 0 min  Stress: Stress Concern Present (04/29/2018)   [place]    Feeling of Stress : Rather much  Social Connections: Somewhat Isolated (04/29/2018)   Social Connection and Isolation Panel [NHANES]    Frequency of Communication with Friends and Family: More than three times a week    Frequency of Social Gatherings with Friends and Family: Never    Attends Religious Services: Never    Marine scientist or Organizations: No    Attends Archivist Meetings: Never    Marital Status: Married  Human resources officer Violence: Unknown (04/29/2018)   Humiliation, Afraid, Rape, and Kick questionnaire    Fear of Current or Ex-Partner: Patient refused    Emotionally Abused: Patient refused    Physically Abused: Patient refused    Sexually Abused: Patient refused    Family History  Problem Relation Age of Onset   Fibromyalgia Mother    Arthritis Mother    Depression Mother    Migraines Mother    Hypertension Father    Diabetes Father    Migraines Brother    Diabetes Paternal Grandmother    Cancer Paternal Grandmother    Stroke Paternal Grandmother     Past Medical History:  Diagnosis Date   Anxiety    Asthma    Breastfeeding (infant)    Chronic back pain    Depression    Fibromyalgia    H/O varicella    Headache    Menorrhagia 08/19/2016   Neck pain 09/28/2018   Positive GBS test 04/18/2018   Right hip pain 04/20/2019   UTI (urinary tract infection) 09/09/2017   Vaginal delivery 05/09/2018   Please schedule this patient for Postpartum visit in: 4 weeks with the following provider: Any provider For C/S patients schedule nurse incision check in weeks  2 weeks: n/a Low risk pregnancy complicated by: none Delivery mode:  SVD Anticipated Birth Control:  BTL, planned for this hospital stay  PP Procedures needed: none  Schedule Integrated [place] visit: no     Patient Active Problem List   Diagnosis Date Noted   Epigastric pain 09/30/2022   Low TSH level 12/18/2021   Chronic migraine without aura, with intractable migraine, so stated, with status migrainosus 06/30/2021   Irregular periods 09/02/2020   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   Fibromyalgia 01/27/2020   Myofascial pain 01/27/2020   Fatigue 09/26/2019   Cholelithiasis 06/08/2019   Migraine 06/03/2017   Mild intermittent asthma 12/20/2014   Bilateral hip pain 12/08/2014   Depression 12/20/2013   Low back pain 02/11/2012    Past Surgical History:  Procedure Laterality Date   CHOLECYSTECTOMY     DILATION AND CURETTAGE OF UTERUS     DILATION AND EVACUATION N/A 03/08/2014   Procedure: DILATATION AND EVACUATION ;  Surgeon: Delice Lesch, MD;  Location: [facility];  Service: Gynecology;  Laterality: N/A;  with chromosomal studies, sent    TONSILLECTOMY     TUBAL LIGATION Bilateral 05/10/2018   Procedure: POST PARTUM TUBAL LIGATION;  Surgeon: Woodroe Mode, MD;  Location: [facility];  Service: Gynecology;  Laterality: Bilateral;    Current Outpatient Medications  Medication Sig Dispense Refill   venlafaxine XR (EFFEXOR XR) 75 MG 24 hr capsule Take 1 capsule (75 mg total) by mouth daily with breakfast. 30 capsule 0   ADVAIR DISKUS 250-50 MCG/ACT AEPB 1 puff 2 (two) times daily.     DULoxetine (CYMBALTA) 60 MG capsule TAKE 1 CAPSULE BY MOUTH EVERY DAY 90 capsule 1   gabapentin (NEURONTIN) 600 MG tablet TAKE 1 TABLET(600MG) 3 TIMES A DAY AND 2 TABLETS(1200MG) AT BEDTIME 150 tablet 5   Galcanezumab-gnlm (EMGALITY) 120 MG/ML SOAJ Inject 120 mg into the skin every 30 (thirty) days. 1 mL 3   lidocaine (LIDODERM) 5 % Place 1 patch onto the skin daily. Remove & Discard patch within 12  hours or as directed by MD 6 patch 0   pantoprazole (PROTONIX) 40 MG tablet TAKE 1 TABLET(40 MG) BY MOUTH DAILY 90 tablet 0   promethazine (PHENERGAN) 12.5 MG tablet Take 1-2 tablets (12.5-25 mg total) by mouth every 6 (six) hours as needed for nausea or vomiting. 120 tablet 5   tiZANidine (ZANAFLEX) 4 MG tablet TAKE 1 TABLET BY MOUTH EVERY 6 HOURS AS NEEDED (Patient not taking: Reported on 09/29/2022) 120 tablet 3   traMADol (ULTRAM) 50 MG tablet Take 1 tablet (50 mg total) by mouth every 6 (six) hours as needed. 120 tablet 5   Ubrogepant (UBRELVY) 100 MG TABS Take 1 tablet (100 mg total) by mouth every 2 (two) hours as needed. Maximum 276m a day. 16 tablet 0   venlafaxine XR (EFFEXOR-XR) 150 MG 24 hr capsule Take 1 capsule (150 mg total) by mouth daily with breakfast. 30 capsule 0   VENTOLIN HFA 108 (90 Base) MCG/ACT inhaler TAKE 2 PUFFS BY MOUTH EVERY 6 HOURS AS NEEDED FOR WHEEZE OR SHORTNESS OF BREATH 18 each 2   No current facility-administered medications for this visit.    Allergies as of 11/04/2022   (No Known Allergies)    Vitals: LMP 10/12/2022 (Exact Date)  Last Weight:  Wt Readings from Last 1 Encounters:  10/24/22 192 lb (87.1 kg)   Last Height:   Ht Readings from Last 1 Encounters:  10/24/22 5' 6"$  (1.676 m)     Physical exam: Exam: Gen: NAD, conversant, well nourised, obese, well groomed                     CV: RRR, no MRG. No Carotid Bruits. No peripheral edema, warm, nontender Eyes: Conjunctivae clear without exudates or hemorrhage  Neuro: Detailed Neurologic Exam  Speech:    Speech is normal; fluent and spontaneous with normal comprehension.  Cognition:    The patient is oriented to person, place, and time;     recent and remote memory intact;     language fluent;     normal attention, concentration,     fund of knowledge Cranial Nerves:    The pupils are equal, round, and reactive to light. The fundi are normal and spontaneous venous pulsations are  present. Visual fields are full to finger confrontation. Extraocular movements are intact. Trigeminal sensation is intact and the muscles of mastication are normal. The face is symmetric. The palate elevates in the midline. Hearing intact. Voice is normal. Shoulder shrug is normal. The  tongue has normal motion without fasciculations.   Coordination:    Normal finger to nose and heel to shin. Normal rapid alternating movements.   Gait:    Heel-toe and tandem gait are normal.   Motor Observation:    No asymmetry, no atrophy, and no involuntary movements noted. Tone:    Normal muscle tone.    Posture:    Posture is normal. normal erect    Strength:    Strength is V/V in the upper and lower limbs.      Sensation: intact to LT     Reflex Exam:  DTR's:    Deep tendon reflexes in the upper and lower extremities are normal bilaterally.   Toes:    The toes are downgoing bilaterally.   Clonus:    Clonus is absent.    Assessment/Plan:  Patient with chronic migraines, failed multiple medications.  Had a long talk about migraine management, especially discussion of the analgesic rebound.  We discussed acute and preventative treatments, lifestyle changes, triggers.  I provided her with literature dealing with an overview of migraines and migraine management.  - Preventative: Emgality once monthly to reduce overall migraine burden (urine pregnancy neg 06/21/2021) discussed teratogenicity, do not get pregnant on Emgality and must be off of it for at least 6 months before getting pregnant, patient reports there are no plans for any more children. - Acute/emergent: Stop ibuprofen and other over the counter meds. Can take up to 10 Imitrex a month and can take up to 16 ubrelvy a month - Ubrelvy: Please take one tablet at the onset of your headache. If it does not improve the symptoms please take one additional tablet. Do not take more then 2 tablets in 24hrs. Do not take use more then 2 to 3 times in a  week.  -Patient denies vision changes, positional or exertional qualities, we discussed an MRI of the brain and I did recommend should the above plan not improve her situation I would highly recommend it.  They agreed.  Discussed: To prevent or relieve headaches, try the following: Cool Compress. Lie down and place a cool compress on your head.  Avoid headache triggers. If certain foods or odors seem to have triggered your migraines in the past, avoid them. A headache diary might help you identify triggers.  Include physical activity in your daily routine. Try a daily walk or other moderate aerobic exercise.  Manage stress. Find healthy ways to cope with the stressors, such as delegating tasks on your to-do list.  Practice relaxation techniques. Try deep breathing, yoga, massage and visualization.  Eat regularly. Eating regularly scheduled meals and maintaining a healthy diet might help prevent headaches. Also, drink plenty of fluids.  Follow a regular sleep schedule. Sleep deprivation might contribute to headaches Consider biofeedback. With this mind-body technique, you learn to control certain bodily functions -- such as muscle tension, heart rate and blood pressure -- to prevent headaches or reduce headache pain.    Proceed to emergency room if you experience new or worsening symptoms or symptoms do not resolve, if you have new neurologic symptoms or if headache is severe, or for any concerning symptom.   Provided education and documentation from American headache Society toolbox including articles on: chronic migraine medication overuse headache, chronic migraines, prevention of migraines, behavioral and other nonpharmacologic treatments for headache.   No orders of the defined types were placed in this encounter.   No orders of the defined types were placed in this encounter.  Cc: Phylis Bougie, DO  Sarina Ill, MD  [facility] [address] [place], [place] [address]  Phone [phone] Fax [phone]

## 2022-11-06 ENCOUNTER — Encounter: Payer: Self-pay | Admitting: Neurology

## 2022-11-11 ENCOUNTER — Encounter (HOSPITAL_COMMUNITY): Payer: Self-pay | Admitting: Emergency Medicine

## 2022-11-11 ENCOUNTER — Other Ambulatory Visit: Payer: Self-pay

## 2022-11-11 ENCOUNTER — Emergency Department (HOSPITAL_COMMUNITY)
Admission: EM | Admit: 2022-11-11 | Discharge: 2022-11-11 | Discharge status: Against Medical Advice | Payer: Medicaid Other | Attending: Emergency Medicine | Admitting: Emergency Medicine

## 2022-11-11 DIAGNOSIS — Z5321 Procedure and treatment not carried out due to patient leaving prior to being seen by health care provider: Secondary | ICD-10-CM | POA: Insufficient documentation

## 2022-11-11 DIAGNOSIS — R519 Headache, unspecified: Secondary | ICD-10-CM | POA: Diagnosis present

## 2022-11-11 DIAGNOSIS — M542 Cervicalgia: Secondary | ICD-10-CM | POA: Diagnosis not present

## 2022-11-11 NOTE — ED Triage Notes (Signed)
Pt c/o neck pain that has been leading to headaches x one week.

## 2022-11-17 ENCOUNTER — Other Ambulatory Visit (HOSPITAL_COMMUNITY): Payer: Self-pay

## 2022-11-26 ENCOUNTER — Encounter
Payer: Medicaid Other | Attending: Physical Medicine and Rehabilitation | Admitting: Physical Medicine and Rehabilitation

## 2022-11-26 ENCOUNTER — Encounter: Payer: Self-pay | Admitting: Physical Medicine and Rehabilitation

## 2022-11-26 VITALS — BP 105/79 | HR 112 | Ht 66.0 in | Wt 197.0 lb

## 2022-11-26 DIAGNOSIS — Z79891 Long term (current) use of opiate analgesic: Secondary | ICD-10-CM | POA: Diagnosis not present

## 2022-11-26 DIAGNOSIS — Z5181 Encounter for therapeutic drug level monitoring: Secondary | ICD-10-CM

## 2022-11-26 DIAGNOSIS — G43711 Chronic migraine without aura, intractable, with status migrainosus: Secondary | ICD-10-CM

## 2022-11-26 DIAGNOSIS — M542 Cervicalgia: Secondary | ICD-10-CM | POA: Insufficient documentation

## 2022-11-26 DIAGNOSIS — G8929 Other chronic pain: Secondary | ICD-10-CM

## 2022-11-26 DIAGNOSIS — G43E19 Chronic migraine with aura, intractable, without status migrainosus: Secondary | ICD-10-CM | POA: Diagnosis present

## 2022-11-26 DIAGNOSIS — G894 Chronic pain syndrome: Secondary | ICD-10-CM

## 2022-11-26 MED ORDER — EMGALITY 120 MG/ML ~~LOC~~ SOAJ: 120.0000 mg | SUBCUTANEOUS | 3 refills | Status: AC

## 2022-11-26 MED ORDER — TRAMADOL HCL 50 MG PO TABS: 50.0000 mg | ORAL_TABLET | Freq: Four times a day (QID) | ORAL | 1 refills | Status: DC | PRN

## 2022-11-26 MED ORDER — UBRELVY 100 MG PO TABS: 100.0000 mg | ORAL_TABLET | ORAL | 5 refills | Status: AC | PRN

## 2022-11-26 NOTE — Progress Notes (Signed)
Subjective:    Patient ID: Marissa Contreras, female    DOB: 1990/11/14, 32 y.o.   MRN: [id number]  HPI Patient is a 32 yr old R handed  female with hx of anxiety and depression who here's for f/uof B/L low back pain with sciatica down B/L legs- down to toes. Likely herniated disc- causing a noxious stimulation ot spinal cord- reason likely steroids  didn't work.Also has myofascial pain syndrome and fibromyalgia based on exam. Also has hypermobility syndrome.  Also c/o suicidal ideation.  Here for f/u on chronic pain syndrome.   Hasn't seen me for 1 year due to lack of insurance.  Has gotten insurance back now.   Back to where she was- hurting-   Tramadol helps everything but neck and migraines.   Last refilled Tramadol last March Took last 2 pills yesterday.    Was going to Neurology-  Ran out of [facility]elyn Meier And got kicked out due to missing appointments due to insurance issues.  Emgality was real helpful-has failed gabapentin and naltrexone and cymbalta- never tried topamax--  and BP too low to take B blockers or Calcium channel blockers-  Roselyn Meier- was real helpful- imitrex wasn't helpful- never tried another triptan Having migraines almost daily right now Has word finding and dizziness as well as an aura with Migraines  Back on Effexor- 225 mg daily-     Pain Inventory Average Pain 8 Pain Right Now 8 My pain is constant, sharp, burning, dull, stabbing, tingling, and aching  In the last 24 hours, has pain interfered with the following? General activity 2 Relation with others 3 Enjoyment of life 2 What TIME of day is your pain at its worst? evening and night Sleep (in general) Poor  Pain is worse with: bending, sitting, standing, and some activites Pain improves with: rest, heat/ice, medication, TENS, and injections Relief from Meds: 4  Family History  Problem Relation Age of Onset   Fibromyalgia Mother    Arthritis Mother    Depression Mother    Migraines Mother     Hypertension Father    Diabetes Father    Migraines Brother    Diabetes Paternal Grandmother    Cancer Paternal Grandmother    Stroke Paternal Grandmother    Social History   Socioeconomic History   Marital status: Married    Spouse name: tommy robinson   Number of children: 2   Years of education: 12   Highest education level: 12th grade  Occupational History   Not on file  Tobacco Use   Smoking status: Every Day    Packs/day: 1.00    Types: Cigarettes    Last attempt to quit: 09/24/2017    Years since quitting: 5.1   Smokeless tobacco: Never   Tobacco comments:    quit for pregnancy  Vaping Use   Vaping Use: Some days  Substance and Sexual Activity   Alcohol use: No    Comment: occ   Drug use: No   Sexual activity: Yes    Birth control/protection: None  Other Topics Concern   Not on file  Social History Narrative   Lives with husband and mother in [place].  Husband is 86 years older, works at [employer].  No hobbies, described herself as a "homebody"  Online courses for high school due to difficulty with social stresses.     Social Determinants of Health   Financial Resource Strain: Low Risk  (04/29/2018)   Overall Financial Resource Strain (CARDIA)    Difficulty  of Paying Living Expenses: Not hard at all  Food Insecurity: No Food Insecurity (04/29/2018)   Hunger Vital Sign    Worried About Running Out of Food in the Last Year: Never true    Ran Out of Food in the Last Year: Never true  Transportation Needs: Unmet Transportation Needs (04/29/2018)   PRAPARE - Transportation    Lack of Transportation (Medical): Yes    Lack of Transportation (Non-Medical): Yes  Physical Activity: Inactive (04/29/2018)   Exercise Vital Sign    Days of Exercise per Week: 0 days    Minutes of Exercise per Session: 0 min  Stress: Stress Concern Present (04/29/2018)   Mercedes    Feeling of Stress : Rather much  Social  Connections: Somewhat Isolated (04/29/2018)   Social Connection and Isolation Panel [NHANES]    Frequency of Communication with Friends and Family: More than three times a week    Frequency of Social Gatherings with Friends and Family: Never    Attends Religious Services: Never    Marine scientist or Organizations: No    Attends Music therapist: Never    Marital Status: Married   Past Surgical History:  Procedure Laterality Date   CHOLECYSTECTOMY     DILATION AND CURETTAGE OF UTERUS     DILATION AND EVACUATION N/A 03/08/2014   Procedure: DILATATION AND EVACUATION ;  Surgeon: Delice Lesch, MD;  Location: [facility];  Service: Gynecology;  Laterality: N/A;  with chromosomal studies, sent    TONSILLECTOMY     TUBAL LIGATION Bilateral 05/10/2018   Procedure: POST PARTUM TUBAL LIGATION;  Surgeon: Woodroe Mode, MD;  Location: [facility];  Service: Gynecology;  Laterality: Bilateral;   Past Surgical History:  Procedure Laterality Date   CHOLECYSTECTOMY     DILATION AND CURETTAGE OF UTERUS     DILATION AND EVACUATION N/A 03/08/2014   Procedure: DILATATION AND EVACUATION ;  Surgeon: Delice Lesch, MD;  Location: [facility];  Service: Gynecology;  Laterality: N/A;  with chromosomal studies, sent    TONSILLECTOMY     TUBAL LIGATION Bilateral 05/10/2018   Procedure: POST PARTUM TUBAL LIGATION;  Surgeon: Woodroe Mode, MD;  Location: [facility];  Service: Gynecology;  Laterality: Bilateral;   Past Medical History:  Diagnosis Date   Anxiety    Asthma    Breastfeeding (infant)    Chronic back pain    Depression    Fibromyalgia    H/O varicella    Headache    Menorrhagia 08/19/2016   Neck pain 09/28/2018   Positive GBS test 04/18/2018   Right hip pain 04/20/2019   UTI (urinary tract infection) 09/09/2017   Vaginal delivery 05/09/2018   Please schedule this patient for Postpartum visit in: 4 weeks with the following provider: Any provider For C/S patients  schedule nurse incision check in weeks 2 weeks: n/a Low risk pregnancy complicated by: none Delivery mode:  SVD Anticipated Birth Control:  BTL, planned for this hospital stay  PP Procedures needed: none  Schedule Integrated BH visit: no    BP 105/79   Pulse (!) 112   Ht '5\' 6"'$  (1.676 m)   Wt 197 lb (89.4 kg)   SpO2 95%   BMI 31.80 kg/m   Opioid Risk Score:   Fall Risk Score:  `1  Depression screen [facility] 2/9     11/26/2022   10:51 AM 10/24/2022    2:40 PM 09/29/2022  2:42 PM 12/18/2021    3:01 PM 12/13/2021   11:37 AM 03/21/2021   11:52 AM 02/26/2021   11:13 AM  Depression screen PHQ 2/9  Decreased Interest '1 2 3 2 3 2 2  '$ Down, Depressed, Hopeless '1 1 3 3 3 2 2  '$ PHQ - 2 Score '2 3 6 5 6 4 4  '$ Altered sleeping  '3 3 3 3 '$ 0 3  Tired, decreased energy  '3 3 2 3 '$ 0 3  Change in appetite  '3 3 3 3 '$ 0 3  Feeling bad or failure about yourself   '1 2 3 3 '$ 0 1  Trouble concentrating  '2 3 2 3 '$ 0 3  Moving slowly or fidgety/restless  '1 2 2 3 '$ 0 1  Suicidal thoughts  '1 1 2 3 '$ 0 0  PHQ-9 Score  '17 23 22 27 4 18  '$ Difficult doing work/chores       Somewhat difficult    Review of Systems  Musculoskeletal:  Positive for back pain and neck pain.       Pain in both feet and ankles, both lower arms, both legs & hips, right shoulder pain  Neurological:  Positive for headaches.  All other systems reviewed and are negative.      Objective:   Physical Exam  Awake, alert, appropriate, accompanied by husband, sitting on table; appears like hurting- holding self very stiffly Decreased speech- having a migraine Trigger points in upper traps, levators and scalenes as well as rhomboids, splenius capitus; and pecs B/L        Assessment & Plan:   Patient is a 32 yr old R handed  female with hx of anxiety and depression who here's for f/uof B/L low back pain with sciatica down B/L legs- down to toes. Likely herniated disc- causing a noxious stimulation ot spinal cord- reason likely steroids  didn't work.Also has  myofascial pain syndrome and fibromyalgia based on exam. Also has hypermobility syndrome.    Will refill Tramadol 50 mg 4x/day as needed- 1 refill since waiting for UDS  2. Urine drug screen today since been 1 year. Continue opiate contract as already signed.    3. Cervical Xray to assess chronic neck pain  4. Emgality was real helpful-has failed gabapentin and naltrexone and cymbalta- never tried topamax--  and BP too low to take B blockers or Calcium channel blockers-  [facility]ier- was real helpful- imitrex wasn't helpful- never tried another triptan- will restart Emgality since failed enough classes to go back to Emgality 120 mg q 30 days-   5. For complicated migraines- rescue meds-  also has failed Imitrex AND has complicated migraines, so should not use Triptans anyway- will restart Ubrelvy 100 mg as needed max 16 pills/month.   6. Will put on wait list for trigger point injections- since really wants them again.    7. F/U - asap wait list for trigger points and f/u 2-3 months- make appointment q 62month    8. Work is pretty upset with her- needs to send over paperwork from today- might need documentation about pain issues. Esp migraines.   I spent a total of  21  minutes on total care today- >50% coordination of care- due to  discussion about migraines and issues from it

## 2022-11-26 NOTE — Patient Instructions (Signed)
Patient is a 33 yr old R handed  female with hx of anxiety and depression who here's for f/uof B/L low back pain with sciatica down B/L legs- down to toes. Likely herniated disc- causing a noxious stimulation ot spinal cord- reason likely steroids  didn't work.Also has myofascial pain syndrome and fibromyalgia based on exam. Also has hypermobility syndrome.    Will refill Tramadol 50 mg 4x/day as needed- 1 refill since waiting for UDS  2. Urine drug screen today since been 1 year. Continue opiate contract as already signed.    3. Cervical Xray to assess chronic neck pain  4. Emgality was real helpful-has failed gabapentin and naltrexone and cymbalta- never tried topamax--  and BP too low to take B blockers or Calcium channel blockers-  Roselyn Meier- was real helpful- imitrex wasn't helpful- never tried another triptan- will restart Emgality since failed enough classes to go back to Emgality 120 mg q 30 days-   5. For complicated migraines- rescue meds-  also has failed Imitrex AND has complicated migraines, so should not use Triptans anyway- will restart Ubrelvy 100 mg as needed max 16 pills/month.   6. Will put on wait list for trigger point injections- since really wants them again.    7. F/U - asap wait list for trigger points and f/u 2-3 months- make appointment q 54month    8. Work is pretty upset with her- needs to send over paperwork from today- might need documentation about pain issues. Esp migraines.

## 2022-11-27 ENCOUNTER — Other Ambulatory Visit: Payer: Self-pay | Admitting: Family Medicine

## 2022-11-29 LAB — TOXASSURE SELECT,+ANTIDEPR,UR

## 2022-12-01 ENCOUNTER — Telehealth: Payer: Self-pay | Admitting: *Deleted

## 2022-12-01 NOTE — Telephone Encounter (Signed)
Patient requesting a letter be written and sent via mychart concerning her migraines. She is asking for a work note for her employer stating she is not able to sit for long periods of time due to severe migraines.

## 2022-12-01 NOTE — Telephone Encounter (Signed)
Left message for patient to leave more detail on what she's asking in regards to long periods of time sitting.

## 2022-12-04 ENCOUNTER — Telehealth: Payer: Self-pay

## 2022-12-04 NOTE — Telephone Encounter (Signed)
PA submitted for Ubrelvy

## 2022-12-10 ENCOUNTER — Telehealth: Payer: Self-pay | Admitting: *Deleted

## 2022-12-10 NOTE — Telephone Encounter (Signed)
Formal Warning Letter sent through [place] [facility] [employer].

## 2022-12-10 NOTE — Telephone Encounter (Signed)
Urine drug screen for this encounter is consistent for prescribed medication, but is also positive for THC. 

## 2022-12-10 NOTE — Telephone Encounter (Signed)
Needs a formal letter warning her that 1 more time, positive, can be discharged from clinic- thank you- has had 2 episodes (+).

## 2022-12-12 NOTE — Telephone Encounter (Signed)
Let pt know that I will not continue her migraines medicines long term- she needs ot get a new neurologist- I don't treat migraines long term, just short term.   I filled out paperowrk stating - as she wanted, that she will get get 7 days/week 2-6 hours/day per pt- that she needs to be out of work.   I explained I was concerned this would make her lose her job- however she wanted me ot write it down.

## 2022-12-16 ENCOUNTER — Encounter: Payer: Self-pay | Admitting: Physical Medicine and Rehabilitation

## 2022-12-26 ENCOUNTER — Encounter: Payer: Medicaid Other | Admitting: Physical Medicine and Rehabilitation

## 2022-12-29 NOTE — Telephone Encounter (Signed)
denied °

## 2022-12-31 ENCOUNTER — Telehealth: Payer: Self-pay

## 2022-12-31 NOTE — Telephone Encounter (Signed)
Dr.Lovorn is out of the office this afternoon. Please advise.  Patient call for a Gabapentin & Effexor refill. She stated this is day #2 without Effexor. And she is afraid she will have medication withdrawal.   Patient has been advised to call the last provider Idalia Needle DO) whom prescribed the Effexor. And the message will be sent to Dr. Berline Chough to review upon her return.  Call back phone  [phone] (Mother's phone - Lawson Fiscal)

## 2022-12-31 NOTE — Telephone Encounter (Signed)
2nd message Patient called back again requesting refill on Effexor. She did call her pcp's office and states they were closed. She is aware the Dr. Berline Chough is out of office.

## 2023-01-01 ENCOUNTER — Other Ambulatory Visit: Payer: Self-pay | Admitting: Family Medicine

## 2023-01-01 DIAGNOSIS — F331 Major depressive disorder, recurrent, moderate: Secondary | ICD-10-CM

## 2023-01-01 MED ORDER — VENLAFAXINE HCL ER 150 MG PO CP24: 150.0000 mg | ORAL_CAPSULE | Freq: Every day | ORAL | 2 refills | Status: DC

## 2023-01-01 MED ORDER — VENLAFAXINE HCL ER 75 MG PO CP24: ORAL_CAPSULE | ORAL | 2 refills | Status: DC

## 2023-01-01 NOTE — Progress Notes (Signed)
Dr. Luz Lex with [facility] called about patient's Effexor, it appears that patient has run out and Dr. Luz Lex was not sure of the dose since we did prescribe it here.  I have not received a Rx request about this. Will send in a refill for 225 mg (150 + 75)

## 2023-01-02 ENCOUNTER — Other Ambulatory Visit: Payer: Self-pay | Admitting: Family Medicine

## 2023-01-02 DIAGNOSIS — F331 Major depressive disorder, recurrent, moderate: Secondary | ICD-10-CM

## 2023-01-02 NOTE — Telephone Encounter (Signed)
Left message on personally identified VM, informing of Dr Dahlia Client response.

## 2023-01-25 ENCOUNTER — Other Ambulatory Visit: Payer: Self-pay | Admitting: Family Medicine

## 2023-01-25 DIAGNOSIS — F331 Major depressive disorder, recurrent, moderate: Secondary | ICD-10-CM

## 2023-02-02 ENCOUNTER — Other Ambulatory Visit (HOSPITAL_COMMUNITY): Payer: Self-pay

## 2023-02-04 ENCOUNTER — Encounter: Payer: Medicaid Other | Admitting: Physical Medicine and Rehabilitation

## 2023-02-04 ENCOUNTER — Telehealth: Payer: Self-pay

## 2023-02-04 MED ORDER — TRAMADOL HCL 50 MG PO TABS: 50.0000 mg | ORAL_TABLET | Freq: Four times a day (QID) | ORAL | 1 refills | Status: DC | PRN

## 2023-02-04 MED ORDER — GABAPENTIN 600 MG PO TABS: ORAL_TABLET | ORAL | 5 refills | Status: DC

## 2023-02-04 NOTE — Telephone Encounter (Signed)
Refill request for Tramadol & Gabapentin. [facility] [address] Thank you  Filled  Written  ID  Drug  QTY  Days  Prescriber  RX #  Dispenser  Refill  Daily Dose*  Pymt Type  PMP  01/25/2023 11/26/2022 1  Tramadol Hcl 50 Mg Tablet 20.00 5 Me Lov [id number] [facility] ([id number]) 4/1 40.00 MME Comm Ins [place] 01/02/2023 01/08/2022 2  Gabapentin 600 Mg Tablet 150.00 30 Me Lov [id number] [facility] ([id number]) 5/5 3.35 LME Medicaid [place]

## 2023-02-05 ENCOUNTER — Ambulatory Visit: Payer: Self-pay | Admitting: Internal Medicine

## 2023-02-05 NOTE — Progress Notes (Deleted)
Name: Marissa Contreras  MRN/ DOB: [id number], Feb 01, 1991    Age/ Sex: 32 y.o., female    PCP: Cora Collum, DO   Reason for Endocrinology Evaluation: Low TSH      Date of Initial Endocrinology Evaluation: 02/05/2023     HPI: [name] is a 32 y.o. female with a past medical history of migraine headaches, mild intermittent asthma, fibromyalgia. The patient presented for initial endocrinology clinic visit on 02/05/2023 for consultative assistance with her Low TSH .   Patient has been noted with intermittently low TSH with a nadir of 0.398 u IU/mL 11/2021.   Her most recent TSH has been normal at 0.518u IU/mL 10/16/2022  In reviewing her records, the patient has had dizziness requiring ED visit 09/2022   She is s/p tubal ligation 2019     HISTORY:  Past Medical History:  Past Medical History:  Diagnosis Date   Anxiety    Asthma    Breastfeeding (infant)    Chronic back pain    Depression    Fibromyalgia    H/O varicella    Headache    Menorrhagia 08/19/2016   Neck pain 09/28/2018   Positive GBS test 04/18/2018   Right hip pain 04/20/2019   UTI (urinary tract infection) 09/09/2017   Vaginal delivery 05/09/2018   Please schedule this patient for Postpartum visit in: 4 weeks with the following provider: Any provider For C/S patients schedule nurse incision check in weeks 2 weeks: n/a Low risk pregnancy complicated by: none Delivery mode:  SVD Anticipated Birth Control:  BTL, planned for this hospital stay  PP Procedures needed: none  Schedule Integrated BH visit: no    Past Surgical History:  Past Surgical History:  Procedure Laterality Date   CHOLECYSTECTOMY     DILATION AND CURETTAGE OF UTERUS     DILATION AND EVACUATION N/A 03/08/2014   Procedure: DILATATION AND EVACUATION ;  Surgeon: Purcell Nails, MD;  Location: [facility];  Service: Gynecology;  Laterality: N/A;  with chromosomal studies, sent    TONSILLECTOMY     TUBAL LIGATION Bilateral 05/10/2018   Procedure:  POST PARTUM TUBAL LIGATION;  Surgeon: Adam Phenix, MD;  Location: [facility];  Service: Gynecology;  Laterality: Bilateral;    Social History:  reports that she has been smoking cigarettes. She has been smoking an average of 1 pack per day. She has never used smokeless tobacco. She reports that she does not drink alcohol and does not use drugs. Family History: family history includes Arthritis in her mother; Cancer in her paternal grandmother; Depression in her mother; Diabetes in her father and paternal grandmother; Fibromyalgia in her mother; Hypertension in her father; Migraines in her brother and mother; Stroke in her paternal grandmother.   HOME MEDICATIONS: Allergies as of 02/05/2023   No Known Allergies      Medication List        Accurate as of Feb 05, 2023  9:02 AM. If you have any questions, ask your nurse or doctor.          Advair Diskus 250-50 MCG/ACT Aepb Generic drug: fluticasone-salmeterol 1 puff 2 (two) times daily.   drospirenone-ethinyl estradiol 3-0.02 MG tablet Commonly known as: YAZ Take 1 tablet by mouth daily.   DULoxetine 30 MG capsule Commonly known as: CYMBALTA Take by mouth.   DULoxetine 60 MG capsule Commonly known as: CYMBALTA TAKE 1 CAPSULE BY MOUTH EVERY DAY   Emgality 120 MG/ML Soaj Generic drug: Galcanezumab-gnlm Inject  120 mg into the skin every 30 (thirty) days.   gabapentin 600 MG tablet Commonly known as: NEURONTIN TAKE 1 TABLET(600MG ) 3 TIMES A DAY AND 2 TABLETS(1200MG ) AT BEDTIME   ibuprofen 400 MG tablet Commonly known as: ADVIL Take by mouth.   lidocaine 5 % Commonly known as: Lidoderm Place 1 patch onto the skin daily. Remove & Discard patch within 12 hours or as directed by MD   pantoprazole 40 MG tablet Commonly known as: PROTONIX TAKE 1 TABLET(40 MG) BY MOUTH DAILY   promethazine 12.5 MG tablet Commonly known as: PHENERGAN Take 1-2 tablets (12.5-25 mg total) by mouth every 6 (six) hours as needed for  nausea or vomiting.   tiZANidine 4 MG tablet Commonly known as: ZANAFLEX Take by mouth.   traMADol 50 MG tablet Commonly known as: Ultram Take 1 tablet (50 mg total) by mouth every 6 (six) hours as needed.   Ubrelvy 100 MG Tabs Generic drug: Ubrogepant Take 1 tablet (100 mg total) by mouth every 2 (two) hours as needed. Maximum 200mg  a day.   venlafaxine XR 150 MG 24 hr capsule Commonly known as: EFFEXOR-XR TAKE 1 CAPSULE(150 MG) BY MOUTH DAILY WITH BREAKFAST   venlafaxine XR 75 MG 24 hr capsule Commonly known as: EFFEXOR-XR TAKE 1 CAPSULE(75 MG) BY MOUTH DAILY WITH BREAKFAST   Ventolin HFA 108 (90 Base) MCG/ACT inhaler Generic drug: albuterol TAKE 2 PUFFS BY MOUTH EVERY 6 HOURS AS NEEDED FOR WHEEZE OR SHORTNESS OF BREATH          REVIEW OF SYSTEMS: A comprehensive ROS was conducted with the patient and is negative except as per HPI and below:  ROS     OBJECTIVE:  VS: There were no vitals taken for this visit.   Wt Readings from Last 3 Encounters:  11/26/22 197 lb (89.4 kg)  11/11/22 193 lb (87.5 kg)  10/24/22 192 lb (87.1 kg)     EXAM: General: Pt appears well and is in NAD  Eyes: External eye exam normal without stare, lid lag or exophthalmos.  EOM intact.  PERRL.  Neck: General: Supple without adenopathy. Thyroid: Thyroid size normal.  No goiter or nodules appreciated. No thyroid bruit.  Lungs: Clear with good BS bilat with no rales, rhonchi, or wheezes  Heart: Auscultation: RRR.  Abdomen: Normoactive bowel sounds, soft, nontender, without masses or organomegaly palpable  Extremities:  BL LE: No pretibial edema normal ROM and strength.  Mental Status: Judgment, insight: Intact Orientation: Oriented to time, place, and person Mood and affect: No depression, anxiety, or agitation     DATA REVIEWED: ***    ASSESSMENT/PLAN/RECOMMENDATIONS:   Hx of Low TSH :    Medications :  Signed electronically by: Lyndle Herrlich, MD  [facility]  Endocrinology  [facility] [address], [address] [place], [place] [address] Phone: [phone] FAX: [phone]   CC: Cora Collum, DO [address] [place] [address] Phone: [phone] Fax: [phone]   Return to Endocrinology clinic as below: Future Appointments  Date Time Provider Department Center  02/05/2023 10:30 AM Dewana Ammirati, Konrad Dolores, MD [facility] None  03/18/2023 11:40 AM Genice Rouge, MD [facility] CPR

## 2023-03-18 ENCOUNTER — Encounter
Payer: Medicaid Other | Attending: Physical Medicine and Rehabilitation | Admitting: Physical Medicine and Rehabilitation

## 2023-03-18 DIAGNOSIS — G43711 Chronic migraine without aura, intractable, with status migrainosus: Secondary | ICD-10-CM | POA: Insufficient documentation

## 2023-03-18 DIAGNOSIS — M542 Cervicalgia: Secondary | ICD-10-CM | POA: Insufficient documentation

## 2023-03-18 DIAGNOSIS — Z5181 Encounter for therapeutic drug level monitoring: Secondary | ICD-10-CM | POA: Insufficient documentation

## 2023-03-18 DIAGNOSIS — Z79891 Long term (current) use of opiate analgesic: Secondary | ICD-10-CM | POA: Insufficient documentation

## 2023-03-18 DIAGNOSIS — G894 Chronic pain syndrome: Secondary | ICD-10-CM | POA: Insufficient documentation

## 2023-03-18 DIAGNOSIS — G8929 Other chronic pain: Secondary | ICD-10-CM | POA: Insufficient documentation

## 2023-03-18 DIAGNOSIS — G43E19 Chronic migraine with aura, intractable, without status migrainosus: Secondary | ICD-10-CM | POA: Insufficient documentation

## 2023-03-30 ENCOUNTER — Other Ambulatory Visit: Payer: Self-pay | Admitting: Physical Medicine and Rehabilitation

## 2023-04-27 ENCOUNTER — Other Ambulatory Visit: Payer: Self-pay

## 2023-04-29 ENCOUNTER — Telehealth: Payer: Self-pay | Admitting: *Deleted

## 2023-04-29 NOTE — Telephone Encounter (Signed)
PA initiated for Tramadol and [name]

## 2023-04-30 MED ORDER — VENLAFAXINE HCL ER 75 MG PO CP24: ORAL_CAPSULE | ORAL | 0 refills | Status: DC

## 2023-05-01 NOTE — Telephone Encounter (Signed)
Marissa Contreras, Marissa Millet, MD  Janean Sark, [name] Caller: Unspecified (2 days ago,  3:39 PM) She was also sent to Neurology- I don't know how to get her more Ubrelvy- but maybe they cane- And pt hasn't been seen for 5 months- I will not refill pain meds until she's seen- she can be seen by Riley Lam, if need be, but She needs to be seen per clinic policy- thanks- ML

## 2023-05-01 NOTE — Telephone Encounter (Signed)
Marissa Contreras (Key: [id number]) - [id number] [name] 100MG  tablets------DENIED Status: PA Response - DeniedCreated: August 5th, 2024 [phone]: August 7th, 2024  Per insurance plan Tramadol did not need PA.

## 2023-05-01 NOTE — Telephone Encounter (Signed)
Tramadol did not need PA according to [facility].

## 2023-05-29 ENCOUNTER — Encounter: Payer: Self-pay | Admitting: Physical Medicine and Rehabilitation

## 2023-05-30 ENCOUNTER — Encounter: Payer: Self-pay | Admitting: Physical Medicine and Rehabilitation

## 2023-06-02 MED ORDER — GABAPENTIN 600 MG PO TABS: ORAL_TABLET | ORAL | 5 refills | Status: DC

## 2023-06-02 MED ORDER — TRAMADOL HCL 50 MG PO TABS: 50.0000 mg | ORAL_TABLET | Freq: Four times a day (QID) | ORAL | 1 refills | Status: DC | PRN

## 2023-06-04 ENCOUNTER — Telehealth: Payer: Self-pay

## 2023-06-04 MED ORDER — TRAMADOL HCL 50 MG PO TABS: 50.0000 mg | ORAL_TABLET | Freq: Four times a day (QID) | ORAL | 1 refills | Status: AC | PRN

## 2023-06-04 MED ORDER — GABAPENTIN 600 MG PO TABS: ORAL_TABLET | ORAL | 2 refills | Status: AC

## 2023-06-04 NOTE — Telephone Encounter (Signed)
Marissa Contreras sent to [facility] Cpr-Prma Clinical Pool Phone Number: [phone]   [facility] [place] [facility] [address] [place], [place] [address] Phone: [phone]. Please transfer to this and future refills go to hear. Can you not do a couple of virtual visits until I can get with another doctor here please? Thank you, Marissa Contreras       Refills (Newest Message First) View All Conversations on this Encounter Milei Monestime  P Cpr-Prma Clinical Pool (supporting Genice Rouge, MD)13 hours ago (7:33 PM)    I [facility] [place] at [address] [place], [place] [address] Phone: [phone].  Please transfer to this and future refills go to hear. Can you not do a couple of virtual visits until I can get with another doctor here please?  Thank you,  Marissa Contreras    Genice Rouge, MD  Karrie Meres days ago    I sent in tramadol and gabapentin to the pharmacy- but all I had online was [facility] [address]- if you need them transferred, can be done- once to a pharmacy of your choice.  I cannot send more without seeing you in person, but sent 2 months of Tramadol and Gabapentin- should have refills on Duloxetine-   The Rx for Tramadol & Gabapentin has been called & cancelled today.   Per Pharmacy Duloxetine is not on file at [facility].  New [facility] in [place] has been added in Epic.  Can you honor the Mychart visit request?

## 2023-06-04 NOTE — Telephone Encounter (Signed)
DONE

## 2023-06-04 NOTE — Telephone Encounter (Signed)
Pay will Pay Out of Pocket for Tramadol 50 MG. No need for Prior Authorization per patient.

## 2023-06-04 NOTE — Telephone Encounter (Signed)
She has been reminded for the face to face visits needed for pain medication refills. Mychart visits will not be granted. Patient understood.  Gabapentin & Tramadol will need to be sent to the [facility] Pharmacy will not transfer controlled medications. Please send new scripts.   (Prior Rx have been cancelled).

## 2023-07-28 ENCOUNTER — Other Ambulatory Visit: Payer: Self-pay | Admitting: *Deleted

## 2023-07-28 MED ORDER — PANTOPRAZOLE SODIUM 40 MG PO TBEC: 40.0000 mg | DELAYED_RELEASE_TABLET | Freq: Every day | ORAL | 0 refills | Status: AC

## 2023-07-29 ENCOUNTER — Other Ambulatory Visit: Payer: Self-pay | Admitting: Student

## 2023-08-31 ENCOUNTER — Other Ambulatory Visit: Payer: Self-pay

## 2023-08-31 DIAGNOSIS — F331 Major depressive disorder, recurrent, moderate: Secondary | ICD-10-CM

## 2023-09-01 MED ORDER — VENLAFAXINE HCL ER 150 MG PO CP24: 150.0000 mg | ORAL_CAPSULE | Freq: Every day | ORAL | 0 refills | Status: AC

## 2023-10-01 ENCOUNTER — Other Ambulatory Visit: Payer: Self-pay | Admitting: Physical Medicine and Rehabilitation

## 2024-03-01 ENCOUNTER — Encounter: Payer: Self-pay | Admitting: *Deleted
